# Patient Record
Sex: Female | Born: 1960
Health system: Southern US, Community
[De-identification: ages and names within clinical notes are randomized; demographics above are authoritative.]

## PROBLEM LIST (undated history)

## (undated) DIAGNOSIS — G4730 Sleep apnea, unspecified: Secondary | ICD-10-CM

## (undated) DIAGNOSIS — E0590 Thyrotoxicosis, unspecified without thyrotoxic crisis or storm: Secondary | ICD-10-CM

## (undated) DIAGNOSIS — T7840XA Allergy, unspecified, initial encounter: Secondary | ICD-10-CM

## (undated) DIAGNOSIS — J302 Other seasonal allergic rhinitis: Secondary | ICD-10-CM

## (undated) DIAGNOSIS — E119 Type 2 diabetes mellitus without complications: Secondary | ICD-10-CM

## (undated) DIAGNOSIS — G4733 Obstructive sleep apnea (adult) (pediatric): Secondary | ICD-10-CM

## (undated) DIAGNOSIS — K635 Polyp of colon: Secondary | ICD-10-CM

## (undated) DIAGNOSIS — M1990 Unspecified osteoarthritis, unspecified site: Secondary | ICD-10-CM

## (undated) HISTORY — DX: Other seasonal allergic rhinitis: J30.2

## (undated) HISTORY — DX: Unspecified osteoarthritis, unspecified site: M19.90

## (undated) HISTORY — DX: Sleep apnea, unspecified: G47.30

## (undated) HISTORY — DX: Obstructive sleep apnea (adult) (pediatric): G47.33

## (undated) HISTORY — DX: Type 2 diabetes mellitus without complications: E11.9

## (undated) HISTORY — DX: Allergy, unspecified, initial encounter: T78.40XA

## (undated) HISTORY — DX: Polyp of colon: K63.5

## (undated) HISTORY — PX: POLYPECTOMY: SHX149

## (undated) HISTORY — DX: Thyrotoxicosis, unspecified without thyrotoxic crisis or storm: E05.90

## (undated) HISTORY — PX: CARPAL TUNNEL RELEASE: SHX101

## (undated) HISTORY — PX: BREAST LUMPECTOMY: SHX2

## (undated) HISTORY — PX: BREAST EXCISIONAL BIOPSY: SUR124

## (undated) HISTORY — PX: TRIGGER FINGER RELEASE: SHX641

---

## 2000-12-13 ENCOUNTER — Encounter: Payer: Self-pay | Admitting: Gastroenterology

## 2001-01-13 ENCOUNTER — Encounter: Payer: Self-pay | Admitting: Gastroenterology

## 2005-08-09 ENCOUNTER — Ambulatory Visit (HOSPITAL_BASED_OUTPATIENT_CLINIC_OR_DEPARTMENT_OTHER): Admission: RE | Admit: 2005-08-09 | Discharge: 2005-08-09 | Payer: Self-pay | Admitting: Orthopedic Surgery

## 2005-08-09 ENCOUNTER — Ambulatory Visit (HOSPITAL_COMMUNITY): Admission: RE | Admit: 2005-08-09 | Discharge: 2005-08-09 | Payer: Self-pay | Admitting: Orthopedic Surgery

## 2006-01-11 ENCOUNTER — Encounter: Admission: RE | Admit: 2006-01-11 | Discharge: 2006-01-11 | Payer: Self-pay | Admitting: Family Medicine

## 2006-12-02 ENCOUNTER — Encounter (INDEPENDENT_AMBULATORY_CARE_PROVIDER_SITE_OTHER): Payer: Self-pay | Admitting: *Deleted

## 2006-12-02 ENCOUNTER — Encounter: Admission: RE | Admit: 2006-12-02 | Discharge: 2006-12-02 | Payer: Self-pay | Admitting: Family Medicine

## 2007-01-17 ENCOUNTER — Encounter: Admission: RE | Admit: 2007-01-17 | Discharge: 2007-01-17 | Payer: Self-pay | Admitting: Family Medicine

## 2008-03-12 ENCOUNTER — Encounter: Admission: RE | Admit: 2008-03-12 | Discharge: 2008-03-12 | Payer: Self-pay | Admitting: Family Medicine

## 2009-03-18 ENCOUNTER — Encounter: Admission: RE | Admit: 2009-03-18 | Discharge: 2009-03-18 | Payer: Self-pay | Admitting: Family Medicine

## 2010-03-31 ENCOUNTER — Encounter: Admission: RE | Admit: 2010-03-31 | Discharge: 2010-03-31 | Payer: Self-pay | Admitting: Family Medicine

## 2010-06-15 ENCOUNTER — Encounter: Payer: Self-pay | Admitting: Internal Medicine

## 2010-06-16 ENCOUNTER — Encounter: Payer: Self-pay | Admitting: Internal Medicine

## 2010-06-16 DIAGNOSIS — G4710 Hypersomnia, unspecified: Secondary | ICD-10-CM | POA: Insufficient documentation

## 2010-06-16 DIAGNOSIS — G4730 Sleep apnea, unspecified: Secondary | ICD-10-CM

## 2010-06-19 ENCOUNTER — Encounter: Payer: Self-pay | Admitting: Internal Medicine

## 2010-06-19 ENCOUNTER — Ambulatory Visit (HOSPITAL_BASED_OUTPATIENT_CLINIC_OR_DEPARTMENT_OTHER): Admission: RE | Admit: 2010-06-19 | Discharge: 2010-06-19 | Payer: Self-pay | Admitting: Internal Medicine

## 2010-06-24 ENCOUNTER — Ambulatory Visit: Payer: Self-pay | Admitting: Internal Medicine

## 2010-07-07 ENCOUNTER — Encounter: Payer: Self-pay | Admitting: Family Medicine

## 2010-07-07 ENCOUNTER — Ambulatory Visit: Payer: Self-pay | Admitting: Internal Medicine

## 2010-07-07 DIAGNOSIS — E119 Type 2 diabetes mellitus without complications: Secondary | ICD-10-CM | POA: Insufficient documentation

## 2010-08-02 ENCOUNTER — Encounter: Payer: Self-pay | Admitting: Internal Medicine

## 2010-08-04 ENCOUNTER — Ambulatory Visit: Payer: Self-pay | Admitting: Internal Medicine

## 2010-08-23 ENCOUNTER — Encounter: Payer: Self-pay | Admitting: Internal Medicine

## 2010-09-07 ENCOUNTER — Encounter (INDEPENDENT_AMBULATORY_CARE_PROVIDER_SITE_OTHER): Payer: Self-pay | Admitting: *Deleted

## 2010-09-22 ENCOUNTER — Encounter: Payer: Self-pay | Admitting: Internal Medicine

## 2010-10-16 ENCOUNTER — Encounter: Payer: Self-pay | Admitting: Internal Medicine

## 2010-11-15 ENCOUNTER — Ambulatory Visit: Payer: Self-pay | Admitting: Gastroenterology

## 2010-11-15 DIAGNOSIS — Z860100 Personal history of colon polyps, unspecified: Secondary | ICD-10-CM | POA: Insufficient documentation

## 2010-11-17 ENCOUNTER — Telehealth: Payer: Self-pay | Admitting: Gastroenterology

## 2010-11-20 ENCOUNTER — Telehealth: Payer: Self-pay | Admitting: Gastroenterology

## 2010-11-30 HISTORY — PX: COLONOSCOPY: SHX174

## 2010-12-22 ENCOUNTER — Ambulatory Visit: Payer: Self-pay | Admitting: Gastroenterology

## 2011-01-08 ENCOUNTER — Ambulatory Visit: Admit: 2011-01-08 | Payer: Self-pay | Admitting: Internal Medicine

## 2011-01-30 NOTE — Letter (Signed)
Summary: [facility] Instructions  [facility]  [address] [place], [place] [address]   Phone: [phone]  Fax: [phone]       KAMESHIA MADRUGA    [birth year]    MRN: [id number]        Procedure Day /Date: Monday January 30th, 2012     Arrival Time: 7:30am     Procedure Time: 8:30am     Location of Procedure:                    _x _  [facility] (4th Floor)                        PREPARATION FOR COLONOSCOPY WITH MOVIPREP   Starting 5 days prior to your procedure 01/24/11 do not eat nuts, seeds, popcorn, corn, beans, peas,  salads, or any raw vegetables.  Do not take any fiber supplements (e.g. Metamucil, Citrucel, and Benefiber).  THE DAY BEFORE YOUR PROCEDURE         DATE: 01/28/11  DAY: Sunday  1.  Drink clear liquids the entire day-NO SOLID FOOD  2.  Do not drink anything colored red or purple.  Avoid juices with pulp.  No orange juice.  3.  Drink at least 64 oz. (8 glasses) of fluid/clear liquids during the day to prevent dehydration and help the prep work efficiently.  CLEAR LIQUIDS INCLUDE: Water Jello Ice Popsicles Tea (sugar ok, no milk/cream) Powdered fruit flavored drinks Coffee (sugar ok, no milk/cream) Gatorade Juice: apple, white grape, white cranberry  Lemonade Clear bullion, consomm, broth Carbonated beverages (any kind) Strained chicken noodle soup Hard Candy                             4.  In the morning, mix first dose of MoviPrep solution:    Empty 1 Pouch A and 1 Pouch B into the disposable container    Add lukewarm drinking water to the top line of the container. Mix to dissolve    Refrigerate (mixed solution should be used within 24 hrs)  5.  Begin drinking the prep at 5:00 p.m. The MoviPrep container is divided by 4 marks.   Every 15 minutes drink the solution down to the next mark (approximately 8 oz) until the full liter is complete.   6.  Follow completed prep with 16 oz of clear liquid of your choice  (Nothing red or purple).  Continue to drink clear liquids until bedtime.  7.  Before going to bed, mix second dose of MoviPrep solution:    Empty 1 Pouch A and 1 Pouch B into the disposable container    Add lukewarm drinking water to the top line of the container. Mix to dissolve    Refrigerate  THE DAY OF YOUR PROCEDURE      DATE: 01/29/11 DAY: Monday  Beginning at 3:30 a.m. (5 hours before procedure):         1. Every 15 minutes, drink the solution down to the next mark (approx 8 oz) until the full liter is complete.  2. Follow completed prep with 16 oz. of clear liquid of your choice.    3. You may drink clear liquids until 6:30am (2 HOURS BEFORE PROCEDURE).   MEDICATION INSTRUCTIONS  Unless otherwise instructed, you should take regular prescription medications with a small sip of water   as early as possible the morning of your  procedure.  Diabetic patients - see separate instructions.       OTHER INSTRUCTIONS  You will need a responsible adult at least 52 years of age to accompany you and drive you home.   This person must remain in the waiting room during your procedure.  Wear loose fitting clothing that is easily removed.  Leave jewelry and other valuables at home.  However, you may wish to bring a book to read or  an iPod/MP3 player to listen to music as you wait for your procedure to start.  Remove all body piercing jewelry and leave at home.  Total time from sign-in until discharge is approximately 2-3 hours.  You should go home directly after your procedure and rest.  You can resume normal activities the  day after your procedure.  The day of your procedure you should not:   Drive   Make legal decisions   Operate machinery   Drink alcohol   Return to work  You will receive specific instructions about eating, activities and medications before you leave.    The above instructions have been reviewed and explained to me by   Marchelle Folks.     I  fully understand and can verbalize these instructions _____________________________ Date _________

## 2011-01-30 NOTE — Progress Notes (Signed)
Summary: r/s appt  Phone Note Call from Patient Call back at [phone]   Caller: Patient Details for Reason: r/s appt Summary of Call: Dr. Lodema Hong wanted you to call her about rescheduling her procedure. Initial call taken by: Schuyler Amor,  November 20, 2010 8:22 AM  Follow-up for Phone Call        Pt wanted to reschedule her procedure too a earlier appt due to insurance. Pt rescheduled for 12/22/10 at 3:00pm and pt verbalized understanding.  Follow-up by: Christie Nottingham CMA Duncan Dull),  November 20, 2010 8:35 AM

## 2011-01-30 NOTE — Miscellaneous (Signed)
Summary: needs appt Dr. 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 need to offer appt with Dr. Gerilyn Pilgrim per referral from [facility]. next available is 08/08/10.Golden Circle RN  July 07, 2010 9:19 AM  she called back & asked that I call her monday & her staff will get her to the phone. will make appt then.Golden Circle RN  July 07, 2010 4:49 PM  LM. her staff says she is in with a pt & will call me back.Golden Circle RN  July 10, 2010 4:50 PM   when she calls, offer her 8/30 (a Tues) wuth Dr. Gerilyn Pilgrim.3 or 4pm..Sally Elijah Birk RN  July 10, 2010 5:00 PM  she will see Dr. Gerilyn Pilgrim at [facility] on 08/29/10.Golden Circle RN  July 11, 2010 8:47 AM

## 2011-01-30 NOTE — Assessment & Plan Note (Signed)
Summary: rov 4 wks ///kp   Visit Type:  Follow-up  CC:  followup on cpap and working well no complaints.  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 She reports a hx of snoring, wittnessed apnea and excessive daytime somnolence. She understands the medical issues. We agreed to proceed with a sleep study, based on history strongly consistent with OSA. She has scheduled an appointment with me to f/u the study. July 07, 2010- OSA She complains of excessive snoring, witnessed apnea and daytime fatigue. Caffeine is some help. She works hard, long hours, and is trying to get back control of her own health.  Bedtime 10-11PM, latency 15-30 minutes, wakes several times before up 4-5AM. She is trying to get more sleep. Pat hx of thyroid treatment, no ENT surgery and no cardiopulmonary disease. NPSG 06/19/10- Mild obstructive apnea, AHI 9.3/hr  August 04, 2010- OSA She is using CPAP, set on autoPAP. She is able to use it every night and she understands what it is doing. Husband reports it prevents her snoring. She is sure she is sleeping better and is not uncomfortable, but admits it is still unfamiliar. We talked about auto vs CPAP, and about travel. Full face mask.   Preventive Screening-Counseling & Management  Alcohol-Tobacco     Smoking Status: never  Current Medications (verified): 1)  Janumet 50-1000 Mg Tabs (Sitagliptin-Metformin Hcl) .... Take 1 By Mouth Two Times A Day 2)  Multivitamins  Tabs (Multiple Vitamin) .... Take 1 By Mouth Once Daily 3)  Aspirin 81 Mg Tbec (Aspirin) .... Take 1 By Mouth Once Daily 4)  Calcium D-1200mg /1000iu .... 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    Vital Signs:  Patient profile:   51 year old female Height:      67 inches Weight:      265.50 pounds BMI:     41.73 O2 Sat:      100 % on Room air Pulse rate:   68 / minute BP sitting:   110 / 60  (right arm) Cuff size:   regular  Vitals Entered By: [name] (August 04, 2010 4:25 PM)  O2 Flow:  Room air CC: followup on cpap, working well no complaints Is Patient Diabetic? 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 I think CPAP will work well and hope it can become a comfortable long-term tool for her. Weight loss may ultimately permit her to get off CPAP. Oral appliances could be evaluated as an option if necessary. Good compliance and control with CPAP at this stage. We will leave her with autoPAP. I  don't think we need to make a pressure range change now.   Medications Added to Medication List This Visit: 1)  Calcium D-1200mg /1000iu  .... Take 1 by mouth once daily 2)  Autopap Advanced   Other Orders: Est. Patient Level II (29562)  Patient Instructions: 1)  Please schedule a follow-up appointment in 4 months. 2)  Please let me know if you have comfort issues with the CPAP so we can change if needed. For now you seem to be coming along very well.

## 2011-01-30 NOTE — Letter (Signed)
Summary: New Patient letter  [facility]  [address] [place], [place] [address]   Phone: [phone]  Fax: [phone]       09/07/2010 MRN: [id number]  [name] [address] [place], [place]  [address]  Dear [name],  Welcome to the Gastroenterology Division at [facility].    You are scheduled to see Dr. Russella Dar on 11-15-10 at 10:00A.M. on the 3rd floor at [facility], [address]  We ask that you try to arrive at our office 15 minutes prior to your appointment time to allow for check-in.  We would like you to complete the enclosed self-administered evaluation form prior to your visit and bring it with you on the day of your appointment.  We will review it with you.  Also, please bring a complete list of all your medications or, if you prefer, bring the medication bottles and we will list them.  Please bring your insurance card so that we may make a copy of it.  If your insurance requires a referral to see a specialist, please bring your referral form from your primary care physician.  Co-payments are due at the time of your visit and may be paid by cash, check or credit card.     Your office visit will consist of a consult with your physician (includes a physical exam), any laboratory testing he/she may order, scheduling of any necessary diagnostic testing (e.g. x-ray, ultrasound, CT-scan), and scheduling of a procedure (e.g. Endoscopy, Colonoscopy) if required.  Please allow enough time on your schedule to allow for any/all of these possibilities.    If you cannot keep your appointment, please call [phone] to cancel or reschedule prior to your appointment date.  This allows [name] the opportunity to schedule an appointment for another patient in need of care.  If you do not cancel or reschedule by 5 p.m. the business day prior to your appointment date, you will be charged a $50.00 late cancellation/no-show fee.    Thank you for choosing  [facility] for your medical needs.  We appreciate the opportunity to care for you.  Please visit [name] at our website  to learn more about our practice.                     Sincerely,                                                             The Gastroenterology Division

## 2011-01-30 NOTE — Letter (Signed)
Summary: Diabetic Instructions  [facility]  [address] [place], [place] [address]   Phone: [phone]  Fax: [phone]    Laura Valencia 1961/02/07 MRN: [id number]   _  x_   ORAL DIABETIC MEDICATION INSTRUCTIONS         (Janumet) The day before your procedure:   Take your diabetic pill as you do normally  The day of your procedure:   Do not take your diabetic pill    We will check your blood sugar levels during the admission process and again in Recovery before discharging you home

## 2011-01-30 NOTE — Assessment & Plan Note (Signed)
Summary: sleep consult-review sleep study/kcw   CC:  Sleep new pt-review sleep study..  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 She reports a hx of snoring, wittnessed apnea and excessive daytime somnolence. She understands the medical issues. We agreed to proceed with a sleep study, based on history strongly consistent with OSA. She has scheduled an appointment with me to f/u the study. July 07, 2010- OSA She complains of excessive snoring, witnessed apnea and daytime fatigue. Caffeine is some help. She works hard, long hours, and is trying to get back control of her own health.  Bedtime 10-11PM, latency 15-30 minutes, wakes several times before up 4-5AM. She is trying to get more sleep. Pat hx of thyroid treatment, no ENT surgery and no cardiopulmonary disease. NPSG 06/19/10- Mild obstructive apnea, AHI 9.3/hr  Preventive Screening-Counseling & Management  Alcohol-Tobacco     Smoking Status: never  Current Medications (verified): 1)  Janumet 50-1000 Mg Tabs (Sitagliptin-Metformin Hcl) .... Take 1 By Mouth Two Times A Day 2)  Multivitamins  Tabs (Multiple Vitamin) .... Take 1 By Mouth Once Daily 3)  Aspirin 81 Mg Tbec (Aspirin) .... Take 1 By Mouth Once Daily 4)  Calcium-Vitamin D 500-125 Mg-Unit Tabs (Calcium-Vitamin D) .... 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  The patient denies shortness of breath with activity, shortness of breath at rest, productive cough, non-productive cough, coughing up blood, chest pain, irregular heartbeats, acid heartburn, indigestion, loss of appetite, weight change, abdominal pain, difficulty swallowing, sore throat, tooth/dental problems, nasal congestion/difficulty breathing through nose, sneezing, itching, ear ache, anxiety, depression, hand/feet swelling, joint stiffness or pain, rash, change in color of mucus, and fever.    Vital Signs:  Patient profile:   50 year old female Height:      67 inches Weight:      262 pounds BMI:     41.18 Cuff size:   large  Vitals Entered By: Reynaldo Minium CMA (July 07, 2010 4:32 PM)  O2 Flow:  Room air CC: Sleep new pt-review sleep study.   Physical Exam  Additional Exam:  General: A/Ox3; pleasant and cooperative, NAD, SKIN: no rash, lesions NODES: no lymphadenopathy HEENT: [place]/AT, EOM- WNL, Conjuctivae- clear, PERRLA, TM-WNL, Nose- clear, Throat- clear and wnl, Mallampati  III-IV NECK: Supple w/ fair ROM, JVD- none, normal carotid impulses w/o bruits Thyroid- normal to palpation CHEST: Clear to P&A HEART: RRR, no m/g/r heard ABDOMEN: Overweight ZOX:WRUE, nl pulses, no edema  NEURO: Grossly intact to observation      Impression & Recommendations:  Problem # 1:  HYPERSOMNIA WITH SLEEP APNEA UNSPECIFIED (ICD-780.53) Mild obstructive apnea confirmed by NPSG and consistent with her history.  We discussed good sleep hygiene. She is aware of the importance of weight control. We discussed treatment options and medical issues of untreated  sleep apnea. She is willing to try CPAP, with hope that it can buy her time and opportunity to work her weight down as a long term solution. Oral appliances and chin straps may be considered. Surgery is not an interesting option at this time.  Medications  Added to Medication List This Visit: 1)  Janumet 50-1000 Mg Tabs (Sitagliptin-metformin hcl) .... Take 1 by mouth two times a day 2)  Multivitamins Tabs (Multiple vitamin) .... Take 1 by mouth once daily 3)  Aspirin 81 Mg Tbec (Aspirin) .... Take 1 by mouth once daily 4)  Calcium-vitamin D 500-125 Mg-unit Tabs (Calcium-vitamin d) .... Take 1 by mouth once daily  Other Orders: No Charge Patient Arrived (NCPA0) (NCPA0) DME Referral (DME)  Patient Instructions: 1)  Please schedule a follow-up appointment in 1 month  2)  See [facility] to set up a trial with CPAP

## 2011-01-30 NOTE — Progress Notes (Signed)
Summary: COL resch  Phone Note Call from Patient Call back at [phone]   Caller: Patient Call For: Dr. Russella Dar Reason for Call: Talk to Nurse Summary of Call: pt is diabetic and wants to reschedule her COL 2011... nothing available except afternoon procedures in 2011... pt said she wants to come in the afternoon... will need Dr. Ardell Isaacs approval Initial call taken by: Vallarie Mare,  November 17, 2010 3:16 PM  Follow-up for Phone Call        you can offer her the spot on 11/28/10 1:30.  She is not on insulin should be fine. Follow-up by: Darcey Nora RN, CGRN,  November 17, 2010 3:23 PM  Additional Follow-up for Phone Call Additional follow up Details #1::        pt already resch'ed for Dec 23rd at 3pm Additional Follow-up by: Vallarie Mare,  November 21, 2010 8:20 AM

## 2011-01-30 NOTE — Assessment & Plan Note (Signed)
Summary: establish G.I//discuss COL--ch.   History of Present Illness Visit Type: Initial Visit Primary GI MD: Elie Goody MD [facility] Primary Provider: Dr Ocie Bob Chief Complaint: Patient here to discuss having a colonoscopy. She denies any current GI symptoms. 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 Karilyn Cota. She states that colonoscopy was incomplete and she required a barium enema. She subsequently underwent a virtual colonoscopy in 2007 that was unremarkable. She is no ongoing gastrointestinal complaints. The records from her initial colonoscopy barium enema and polypectomy are not available at the time of this dictation.   GI Review of Systems      Denies abdominal pain, acid reflux, belching, bloating, chest pain, dysphagia with liquids, dysphagia with solids, heartburn, loss of appetite, nausea, vomiting, vomiting blood, weight loss, and  weight gain.        Denies anal fissure, black tarry stools, change in bowel habit, constipation, diarrhea, diverticulosis, fecal incontinence, heme positive stool, hemorrhoids, irritable bowel syndrome, jaundice, light color stool, liver problems, rectal bleeding, and  rectal pain.   Current Medications (verified): 1)  Janumet 50-1000 Mg Tabs (Sitagliptin-Metformin Hcl) .... Take 1 By Mouth Two Times A Day 2)  Multivitamins  Tabs (Multiple Vitamin) .... Take 1 By Mouth Once Daily 3)  Calcium D-1200mg /1000iu .... Take 1 By Mouth Once Daily 4)  Autopap Advanced 5)  Vitamin D (Ergocalciferol) 50000 Unit Caps (Ergocalciferol) .... Take 1 Tablet By Mouth Once Per Week  Allergies (verified): No Known Drug Allergies  Past History:  Past Medical History: Diabetes, Type 2 Hx hyperthyroid Rx in past Seasonal rhinitis- mild Colon polyp-type unknown  Obstructive Sleep Apnea- NPSG 06/19/10- AHI 9.3/hr  Past Surgical History: Carpal tunnel  Release-bilateral Left breast lumpectomy- benign  Family History: Cancer-mother(colon cancer at age 24)  Social History: Reviewed history from 07/07/2010 and no changes required. 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         The pertinent positives and negatives are noted as above and in the HPI. All other ROS were reviewed and were negative.   Vital Signs:  Patient profile:   50 year old female Height:      67 inches Weight:      254 pounds BMI:     39.93 BSA:     2.24 Pulse rate:   64 / minute BP sitting:   112 / 80  (left arm) Cuff size:   large  Vitals Entered By: Lamona Curl CMA Duncan Dull) (November 15, 2010 10:26 AM)  Physical Exam  General:  Well developed, well nourished, no acute distress. obese.   Head:  Normocephalic and atraumatic. Eyes:  PERRLA, no icterus. Ears:  Normal auditory acuity. Mouth:  No deformity or lesions, dentition normal. Neck:  Supple; no masses or thyromegaly. Lungs:  Clear throughout to auscultation. Heart:  Regular rate and rhythm; no murmurs, rubs,  or bruits. Abdomen:  Soft, nontender and nondistended. No masses, hepatosplenomegaly or hernias noted. Normal bowel sounds. Rectal:  deferred until time of colonoscopy.   Msk:  Symmetrical with no gross deformities. Normal posture. Pulses:  Normal pulses noted. Extremities:  No clubbing, cyanosis, edema or deformities noted. Neurologic:  Alert and  oriented x4;  grossly normal neurologically. Cervical Nodes:  No significant cervical adenopathy.  Inguinal Nodes:  No significant inguinal adenopathy. Psych:  Alert and cooperative. Normal mood and affect.  Impression & Recommendations:  Problem # 1:  PERSONAL HISTORY OF COLONIC POLYPS (ICD-V12.72) Prior history of colon polyps. The patient feels the polyps were precancerous. Request records  from her prior colonoscopy and pathology. Even if the polyps are not precancerous a screening colonoscopy is indicated given approximately 10 years since her last colonoscopy in 5 years since her virtual colonoscopy. We discussed the options of virtual colonoscopy versus standard colonoscopy and she prefers an attempt at colonoscopy but understands that this exam might be incomplete given prior difficulties. A subsequent virtual colonoscopy or barium enema if colonoscopy is not complete. The risks, benefits and alternatives to colonoscopy with possible biopsy and possible polypectomy were discussed with the patient and they consent to proceed. The procedure will be scheduled electively. Orders: Colonoscopy (Colon)  Problem # 2:  DIABETES, TYPE 2 (ICD-250.00) Management per protocol for diabetes, and oral hypoglycemics.  Patient Instructions: 1)  Pick up your prep from your pharmacy.  2)  Colonoscopy brochure given.  3)  Copy sent to : Renaye Rakers, MD 4)  The medication list was reviewed and reconciled.  All changed / newly prescribed medications were explained.  A complete medication list was provided to the patient / caregiver.  Prescriptions: MOVIPREP 100 GM  SOLR (PEG-KCL-NACL-NASULF-NA ASC-C) As per prep instructions.  #1 x 0   Entered by:   Christie Nottingham CMA (AAMA)   Authorized by:   Meryl Dare MD [facility]   Signed by:   Christie Nottingham CMA Duncan Dull) on 11/15/2010   Method used:   Electronically to        [facility]* (retail)       [address]       [address] [employer]       [place], [place]  [address]       Ph: [phone]       Fax: [phone]   RxID:   [id number]

## 2011-01-30 NOTE — Assessment & Plan Note (Signed)
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 She reports a hx of snoring, wittnessed apnea and excessive daytime somnolence. She understands the medical issues. We agreed to proceed with a sleep study, based on history strongly consistent with OSA. She has scheduled an appointment with me to f/u the study.   Impression & Recommendations:  Problem # 1:  ? of HYPERSOMNIA WITH SLEEP APNEA UNSPECIFIED (ICD-780.53)  We will proceed with Split protocol sleep study as discussed, then review at office.  Other Orders: Sleep Disorder Referral (Sleep Disorder)  Patient Instructions: 1)  Make appointment for sleep study through [facility]  Appended Document: ?sleep apnea  No charge for phone discussion. Not yet seen in office. Office note created for work space in EMR only, for this date.

## 2011-01-30 NOTE — Procedures (Signed)
Summary: Colonoscopy / [facility]  Colonoscopy / [facility]   Imported By: Lennie Odor 12/08/2010 15:41:02  _____________________________________________________________________  External Attachment:    Type:   Image     Comment:   External Document

## 2011-02-01 NOTE — Procedures (Signed)
Summary: Colonoscopy  Patient: Laura Valencia Note: All result statuses are Final unless otherwise noted.  Tests: (1) Colonoscopy (COL)   COL Colonoscopy           DONE     [facility]     [address]     [place], [place]  [address]           COLONOSCOPY PROCEDURE REPORT     PATIENT:  Laura Valencia, Laura Valencia  MR#:  [id number]     BIRTHDATE:  1961/06/13, 49 yrs. old  GENDER:  female     ENDOSCOPIST:  Judie Petit T. Russella Dar, MD, [facility]           PROCEDURE DATE:  12/22/2010     PROCEDURE:  Colonoscopy 19147     ASA CLASS:  Class II     INDICATIONS:  1) surveillance and high-risk screening  2) mother     with colon cancer at 64 3) history of adenomatous polyps: 2001.     MEDICATIONS:   Fentanyl 50 mcg IV, Versed 7.5 mg IV, Benadryl 12.5     mg IV     DESCRIPTION OF PROCEDURE:   After the risks benefits and     alternatives of the procedure were thoroughly explained, informed     consent was obtained.  Digital rectal exam was performed and     revealed no abnormalities.   The LB [id number]-[id number] endoscope     was introduced through the anus and advanced to the cecum, which     was identified by both the appendix and ileocecal valve, limited     by a tortuous colon.    The quality of the prep was excellent,     using MoviPrep.  The instrument was then slowly withdrawn as the     colon was fully examined.     <<PROCEDUREIMAGES>>     FINDINGS:  A normal appearing cecum, ileocecal valve, and     appendiceal orifice were identified. The ascending, hepatic     flexure, transverse, splenic flexure, descending, sigmoid colon,     and rectum appeared unremarkable. Retroflexed views in the rectum     revealed no abnormalities.  The time to cecum =  5.75  minutes.     The scope was then withdrawn (time =  12.75  min) from the patient     and the procedure completed.           COMPLICATIONS:  None           ENDOSCOPIC IMPRESSION:     1) Normal colon           RECOMMENDATIONS:     1)  Repeat Colonoscopy in 5 years.           Venita Lick. Russella Dar, MD, [facility]           CC: Renaye Rakers, MD           n.     Rosalie DoctorVenita Lick. Stark at 12/22/2010 03:24 PM           Syliva Overman, [id number]  Note: An exclamation mark (!) indicates a result that was not dispersed into the flowsheet. Document Creation Date: 12/22/2010 3:24 PM _______________________________________________________________________  (1) Order result status: Final Collection or observation date-time: 12/22/2010 15:19 Requested date-time:  Receipt date-time:  Reported date-time:  Referring Physician:   Ordering Physician: Claudette Head [phone]) Specimen Source:  Source: Launa Grill Order Number: [id number] Lab site:   Appended Document:  Colonoscopy    Clinical Lists Changes  Observations: Added new observation of COLONNXTDUE: 12/23/2015 (12/22/2010 15:32)

## 2011-02-09 ENCOUNTER — Ambulatory Visit: Payer: Self-pay | Admitting: Internal Medicine

## 2011-03-01 ENCOUNTER — Other Ambulatory Visit: Payer: Self-pay | Admitting: Family Medicine

## 2011-03-01 DIAGNOSIS — Z1231 Encounter for screening mammogram for malignant neoplasm of breast: Secondary | ICD-10-CM

## 2011-03-12 LAB — GLUCOSE, CAPILLARY
Glucose-Capillary: 137 mg/dL — ABNORMAL HIGH (ref 70–99)
Glucose-Capillary: 83 mg/dL (ref 70–99)

## 2011-03-23 ENCOUNTER — Ambulatory Visit: Payer: Self-pay | Admitting: Internal Medicine

## 2011-04-06 ENCOUNTER — Ambulatory Visit
Admission: RE | Admit: 2011-04-06 | Discharge: 2011-04-06 | Disposition: A | Discharge status: Home / Self Care | Payer: Commercial Managed Care - PPO | Source: Ambulatory Visit | Attending: Family Medicine | Admitting: Family Medicine

## 2011-04-06 ENCOUNTER — Ambulatory Visit: Payer: Self-pay

## 2011-04-06 DIAGNOSIS — Z1231 Encounter for screening mammogram for malignant neoplasm of breast: Secondary | ICD-10-CM

## 2011-05-18 NOTE — Op Note (Signed)
NAMESHATERRIA, SAGER            ACCOUNT NO.:  [id number]   MEDICAL RECORD NO.:  [id number]          PATIENT TYPE:  AMB   LOCATION:  DSC                          FACILITY:  [facility]   PHYSICIAN:  Dionne Ano. Gramig III, M.D.DATE OF BIRTH:  [birth year]   DATE OF PROCEDURE:  08/09/2005  DATE OF DISCHARGE:                                 OPERATIVE REPORT   PREOPERATIVE DIAGNOSIS:  Left carpal tunnel syndrome.   POSTOPERATIVE DIAGNOSIS:  Left carpal tunnel syndrome.   OPERATION PERFORMED:  1.  Left median nerve/peripheral nerve block.  2.  Left limited open carpal tunnel release.   SURGEON:  Dionne Ano. Amanda Pea, M.D.   ASSISTANT:  None.   ANESTHESIA:  Peripheral nerve block with IV sedation keeping the patient  awake, alert and oriented the entire case.   COMPLICATIONS:  None.   ESTIMATED BLOOD LOSS:  Minimal.   INDICATIONS FOR PROCEDURE:  Dr. Syliva Overman is a very pleasant 50-year-  old female.  She presents for carpal tunnel release.  I have discussed the  risks and benefits of surgery and she desires to proceed with the operative  intervention.  She has had a similar procedure performed about the right  upper extremity and understands the risks and benefits, do's and don't's,  pre and postoperative routines.   DESCRIPTION OF PROCEDURE:  The patient was seen by myself and anesthesia and  taken to the operating suite, permit was signed.  Correct extremity to be  operated on was identified.  She underwent a median nerve/peripheral nerve  block at the wrist __________ purposes for carpal tunnel release with 18 mL  of a mixture of lidocaine 1% without epinephrine and 0.25% Sensorcaine  without epinephrine.  A small amount of Neutrophil was added.  Once this was  done, the patient was prepped and draped in the usual sterile fashion.  She  was kept awake, alert and oriented and a 1 cm incision was made at the  distal edge of the transverse carpal ligament under 250 mmHg of  tourniquet  control.  Dissection was carried down this way until the transverse carpal  ligament was identified and released under 4-point Sheeley loupe  magnification, fat pad egressed nicely.  I took care to release her ulnarly  and then dissected in a distal to proximal direction __________ device 1, 2  and 3 which were placed under the proximal leading leaflet of the transverse  carpal ligament. I  then placed a security clip just under the proximal  leading leaflet.  Obturator disengaged and I placed a security knife in the  security clip effectively releasing the proximal leaflet of the transverse  carpal ligament.  I should note that she had no aberrant median nerve  anatomy, no pain or discomfort during the procedure and all went quite well.  She was fully decompressed, I noted that she was nicely released and that  there was no complicating features.  She had impressive wall thickness of  the transverse carpal ligament distally where the compression was  noticeable.  The patient had the tourniquet deflated at less than 15  minutes.  Irrigation was applied.  Hemostasis obtained with bipolar  electrocautery and once hemostasis was secured and irrigation was  placed, she underwent closure of the wound with interrupted Prolene.  She  was placed in sterile dressing.  Had excellent refill and no complicating  features.  She was transferred to recovery room.  She will return to see [name]  in seven days for follow-up.  All questions have been encouraged and  answered.        WMG/MEDQ  D:  08/09/2005  T:  08/10/2005  Job:  [id number]

## 2011-05-21 ENCOUNTER — Encounter: Payer: Self-pay | Admitting: Internal Medicine

## 2011-05-25 ENCOUNTER — Ambulatory Visit: Payer: Self-pay | Admitting: Internal Medicine

## 2011-08-03 ENCOUNTER — Encounter: Payer: Self-pay | Admitting: Internal Medicine

## 2011-08-03 ENCOUNTER — Ambulatory Visit (INDEPENDENT_AMBULATORY_CARE_PROVIDER_SITE_OTHER): Payer: Commercial Managed Care - PPO | Admitting: Internal Medicine

## 2011-08-03 VITALS — BP 102/74 | HR 64 | Ht 66.0 in | Wt 256.4 lb

## 2011-08-03 DIAGNOSIS — G4730 Sleep apnea, unspecified: Secondary | ICD-10-CM

## 2011-08-03 DIAGNOSIS — G4710 Hypersomnia, unspecified: Secondary | ICD-10-CM

## 2011-08-03 NOTE — Progress Notes (Signed)
  Subjective:    Patient ID: Laura Valencia, female    DOB: 04/04/1961, 50 y.o.   MRN: [id number]  HPI 08/03/11-  Last here-  Life is much better now with AutoPaP. Range is ok. Has settled on full face mask. She is convinced she needs it now, and recognizes headaches if she doesn't. She wears CPAP all night every night and is comfortable with the mask and pressure. Nasal congestion has not been a problem.  Review of Systems Constitutional:   No-   weight loss, night sweats, fevers, chills, fatigue, lassitude. HEENT:   No-   headaches, difficulty swallowing, tooth/dental problems, sore throat,                  No-   sneezing, itching, ear ache, nasal congestion, post nasal drip,  CV:  No-   chest pain, orthopnea, PND, swelling in lower extremities, anasarca, dizziness, palpitations GI:  No-   heartburn, indigestion, abdominal pain, nausea, vomiting, diarrhea,                 change in bowel habits, loss of appetite Resp: No-   shortness of breath with exertion or at rest.  No-  excess mucus,             No-   productive cough,  No non-productive cough,  No-  coughing up of blood.              No-   change in color of mucus.  No- wheezing.   Skin: No-   rash or lesions. GU: No-   dysuria, change in color of urine, no urgency or frequency.  No- flank pain. MS:  No-   joint pain or swelling.  No- decreased range of motion.  No- back pain. Psych:  No- change in mood or affect. No depression or anxiety.  No memory loss.     Objective:   Physical Exam General- Alert, Oriented, Affect-appropriate, Distress- none acute   Skin- rash-none, lesions- none, excoriation- none Lymphadenopathy- none Head- atraumatic            Eyes- Gross vision intact, PERRLA, conjunctivae clear secretions            Ears- Hearing, normal            Nose- Clear,   No evident-Septal dev, mucus, polyps, erosion, perforation             Throat- Mallampati II- noted prior exam , mucosa clear , drainage- none, tonsils-  atrophic Neck- flexible , trachea midline, no stridor , thyroid nl, carotid no bruit Chest - symmetrical excursion , unlabored           Heart/CV- RRR , no murmur , no gallop  , no rub, nl s1 s2                           - JVD- none , edema- none, stasis changes- none, varices- none           Lung- clear to P&A, wheeze- none, cough- none , dullness-none, rub- none           Chest wall-  Abd-  Noted prior exam -tender-no, distended-no, bowel sounds-present, HSM- no Br/ Gen/ Rectal- Not done, not indicated Extrem- cyanosis- none, clubbing, none, atrophy- none, strength- nl Neuro- grossly intact to observation         Assessment & Plan:

## 2011-08-03 NOTE — Patient Instructions (Signed)
Continue with CPAP using AutoPap. Consider visiting [facility] so that they can show you mask alternatives.

## 2011-08-03 NOTE — Assessment & Plan Note (Addendum)
Good compliance and control with [facility]ap/ [facility]. No changes needed. Alternatives were reviewed. Weight loss would help. We discussed alternatives to CPAP so that she would understand options.

## 2011-12-18 ENCOUNTER — Encounter (INDEPENDENT_AMBULATORY_CARE_PROVIDER_SITE_OTHER): Payer: Self-pay | Admitting: *Deleted

## 2011-12-20 ENCOUNTER — Encounter: Payer: Self-pay | Admitting: Internal Medicine

## 2012-04-03 ENCOUNTER — Other Ambulatory Visit: Payer: Self-pay | Admitting: Family Medicine

## 2012-04-03 DIAGNOSIS — Z1231 Encounter for screening mammogram for malignant neoplasm of breast: Secondary | ICD-10-CM

## 2012-04-11 ENCOUNTER — Ambulatory Visit: Payer: Commercial Managed Care - PPO

## 2012-05-02 ENCOUNTER — Ambulatory Visit: Payer: Commercial Managed Care - PPO

## 2012-05-30 ENCOUNTER — Ambulatory Visit
Admission: RE | Admit: 2012-05-30 | Discharge: 2012-05-30 | Disposition: A | Discharge status: Home / Self Care | Payer: 59 | Source: Ambulatory Visit | Attending: Family Medicine | Admitting: Family Medicine

## 2012-05-30 DIAGNOSIS — Z1231 Encounter for screening mammogram for malignant neoplasm of breast: Secondary | ICD-10-CM

## 2013-04-20 ENCOUNTER — Other Ambulatory Visit: Payer: Self-pay

## 2013-04-20 DIAGNOSIS — Z1231 Encounter for screening mammogram for malignant neoplasm of breast: Secondary | ICD-10-CM

## 2013-06-12 ENCOUNTER — Ambulatory Visit
Admission: RE | Admit: 2013-06-12 | Discharge: 2013-06-12 | Disposition: A | Discharge status: Home / Self Care | Payer: 59 | Source: Ambulatory Visit

## 2013-06-12 DIAGNOSIS — Z1231 Encounter for screening mammogram for malignant neoplasm of breast: Secondary | ICD-10-CM

## 2013-10-01 ENCOUNTER — Other Ambulatory Visit: Payer: Self-pay | Admitting: Family Medicine

## 2013-10-02 LAB — CBC WITH DIFFERENTIAL/PLATELET
Basophils Relative: 0 % (ref 0–1)
Eosinophils Relative: 0 % (ref 0–5)
HCT: 42 % (ref 36.0–46.0)
Lymphs Abs: 2.6 10*3/uL (ref 0.7–4.0)
MCH: 26.7 pg (ref 26.0–34.0)
MCV: 81.9 fL (ref 78.0–100.0)
Monocytes Absolute: 0.6 10*3/uL (ref 0.1–1.0)
Neutro Abs: 2.1 10*3/uL (ref 1.7–7.7)
RBC: 5.13 MIL/uL — ABNORMAL HIGH (ref 3.87–5.11)
RDW: 14 % (ref 11.5–15.5)
WBC: 5.4 10*3/uL (ref 4.0–10.5)

## 2013-10-02 LAB — LIPID PANEL
HDL: 68 mg/dL (ref >39)
LDL Cholesterol: 95 mg/dL (ref 0–99)
Total CHOL/HDL Ratio: 2.6 Ratio
Triglycerides: 77 mg/dL (ref <150)
VLDL: 15 mg/dL (ref 0–40)

## 2013-10-02 LAB — COMPLETE METABOLIC PANEL WITH GFR
CO2: 25 mEq/L (ref 19–32)
Chloride: 102 mEq/L (ref 96–112)
GFR, Est African American: >89 mL/min
GFR, Est Non African American: 81 mL/min
Sodium: 139 mEq/L (ref 135–145)
Total Protein: 7.3 g/dL (ref 6.0–8.3)

## 2014-05-07 ENCOUNTER — Other Ambulatory Visit: Payer: Self-pay

## 2014-05-07 DIAGNOSIS — Z1231 Encounter for screening mammogram for malignant neoplasm of breast: Secondary | ICD-10-CM

## 2014-06-18 ENCOUNTER — Ambulatory Visit
Admission: RE | Admit: 2014-06-18 | Discharge: 2014-06-18 | Disposition: A | Discharge status: Home / Self Care | Payer: 59 | Source: Ambulatory Visit

## 2014-06-18 DIAGNOSIS — Z1231 Encounter for screening mammogram for malignant neoplasm of breast: Secondary | ICD-10-CM

## 2015-01-14 ENCOUNTER — Encounter: Payer: 59 | Admitting: Nutrition

## 2015-07-12 ENCOUNTER — Other Ambulatory Visit: Payer: Self-pay

## 2015-07-12 DIAGNOSIS — Z1231 Encounter for screening mammogram for malignant neoplasm of breast: Secondary | ICD-10-CM

## 2015-08-19 ENCOUNTER — Ambulatory Visit: Payer: 59

## 2015-08-26 ENCOUNTER — Ambulatory Visit: Payer: 59

## 2015-08-26 ENCOUNTER — Ambulatory Visit
Admission: RE | Admit: 2015-08-26 | Discharge: 2015-08-26 | Disposition: A | Discharge status: Home / Self Care | Payer: 59 | Source: Ambulatory Visit

## 2015-08-26 DIAGNOSIS — Z1231 Encounter for screening mammogram for malignant neoplasm of breast: Secondary | ICD-10-CM

## 2015-09-23 ENCOUNTER — Ambulatory Visit: Payer: 59

## 2015-11-30 ENCOUNTER — Other Ambulatory Visit: Payer: Self-pay | Admitting: *Deleted

## 2015-11-30 ENCOUNTER — Ambulatory Visit: Payer: 59 | Admitting: *Deleted

## 2015-11-30 ENCOUNTER — Encounter: Payer: Self-pay | Admitting: *Deleted

## 2015-11-30 NOTE — Patient Outreach (Addendum)
Laura Valencia [facility]) Care Management   11/30/2015  RAVONDA WALDMANN 11/20/1961 Laura Valencia:[id number]  Laura Valencia is an 54 y.o. female who presents, with her husband Laura Valencia,  to the [employer] [facility] office for routine Link To Wellness follow up for self management assistance with Type II DM.   Subjective:  [name] says she is motivated to lose weight after her adult son and daughter voiced concern about her health during their Thanksgiving visit.  Objective:   Review of Systems  Constitutional: Negative.     Physical Exam  Constitutional: She is oriented to person, place, and time. She appears well-developed and well-nourished.  Respiratory: Effort normal.  Neurological: She is alert and oriented to person, place, and time.  Skin: Skin is warm and dry.  Psychiatric: She has a normal mood and affect. Her behavior is normal. Judgment and thought content normal.   Filed Vitals:   11/30/15 1245  BP: 92/64    Current Medications:   Current Outpatient Prescriptions  Medication Sig Dispense Refill  . sitaGLIPtan-metformin (JANUMET) 50-1000 MG per tablet Take 1 tablet by mouth 2 (two) times daily.      . Calcium Carbonate-Vit D-Min (CALCIUM 1200 PO) Take 1 tablet by mouth daily.      . Multiple Vitamin (MULTIVITAMIN) tablet Take 1 tablet by mouth daily.       No current facility-administered medications for this visit.    Functional Status:   In your present state of health, do you have any difficulty performing the following activities: 11/30/2015  Hearing? N  Vision? N  Difficulty concentrating or making decisions? N  Walking or climbing stairs? Y  Dressing or bathing? N  Doing errands, shopping? N    Fall/Depression Screening:    PHQ 2/9 Scores 11/30/2015  PHQ - 2 Score 0    Assessment:   [employer] employee and Link To Wellness member with Type II DM currently meeting Hgb A1C target. Verbalizing desire to lose weight in  the next 6 months.  Plan:  [facility] CM Care Plan Problem One        Most Recent Value   Care Plan Problem One  Patent with Type II DM and obesity, expressing desire to lose weight and lower Hgb A1C   Role Documenting the Problem One  Care Management [place] for Problem One  Active   THN Long Term Goal (31-90 days)  Patient will report increased exercise frequency, no weight gain and improved glycemic control as evidenced by improved Hgb A1C at next assessment   THN Long Term Goal Start Date  11/30/15   Interventions for Problem One Long Term Goal  reviewed strategies to improve glycemic control, reviewed exercise opportunities offered by [facility], reviewed nutritional counseling benefit offered by [facility], will arrange for Link To Wellness follow up in 6 months      RNCM to fax today's office visit note to [name]. Also faxed request for most recent office visit note and lab results. RNCM will meet every 6 months and as needed with patient per Link To Wellness program guidelines to assist with Type II DM self-management and assess patient's progress toward mutually set goals.  Barrington Ellison RN,CCM,CDE [place] Management Coordinator Link To Wellness Office Phone [phone] Office Fax [phone]

## 2015-12-23 ENCOUNTER — Encounter: Payer: Self-pay | Admitting: Gastroenterology

## 2016-01-04 MED FILL — INVOKANA 300 MG TABLET: 300 | 90 days supply | Qty: 90 | Fill #1

## 2016-01-05 MED FILL — JANUMET XR 50-1,000 MG TAB: 50-1000 | 90 days supply | Qty: 180 | Fill #1

## 2016-01-12 MED FILL — TRUEplus LANCETS 30G MISC: 90 days supply | Qty: 200 | Fill #0

## 2016-01-12 MED FILL — SM ALCOHOL 70% PREP PADS: 70 | 30 days supply | Qty: 100 | Fill #0

## 2016-01-12 MED FILL — TRUE METRIX GLUCOSE TEST ST: 90 days supply | Qty: 200 | Fill #0

## 2016-01-20 ENCOUNTER — Encounter: Payer: 59 | Attending: Family Medicine | Admitting: Nutrition

## 2016-01-20 VITALS — Wt 248.0 lb

## 2016-01-20 DIAGNOSIS — E669 Obesity, unspecified: Secondary | ICD-10-CM

## 2016-01-20 DIAGNOSIS — E119 Type 2 diabetes mellitus without complications: Secondary | ICD-10-CM | POA: Insufficient documentation

## 2016-01-23 ENCOUNTER — Encounter: Payer: Self-pay | Admitting: Nutrition

## 2016-01-23 NOTE — Patient Instructions (Addendum)
   Goals: 1. Follow Plate Method 2. Cut out snacks between meals. 3. Eat meals on time. Nothing after 7 pm unless having a low blood sugar. 4. Portion foods outs. 5. Exercise 30+ minutes 5 days per week. 6. Lose 1-2 lbs per week. 7. Increase low carb vegetables to 4-5 servings per day. 8. Get A1C to 5.7-6.0%.

## 2016-01-23 NOTE — Progress Notes (Signed)
  Medical Nutrition Therapy:  Appt start time: 1500 end time:  1600.  Assessment:  Primary concerns today: Diabetes and obesity. Lives with her husband. Type 2 DM.  Last A1C was 6.1% per pt. On Janumet and Invokana daily. Wants to lose weight and improve blood sugars. She and her husband shop and cook. Most meals eaten at home, baked and broiled. Eats a good variety of foods. Admits to needing to exercise more and cut out snacks. Currenlty taking  Janumet and Invokana for DM. Diet is excessive in carbs at times. Need to cut out excess calories and exercise more for needed weight loss.  Preferred Learning Style:  No preference indicated   Learning Readiness:   Ready  Change in progress   MEDICATIONS: See list   DIETARY INTAKE:   24-hr recall:  Eats three meals per day. May end up eating later at night at times.  Snacks on misc stuff between meals at times. Drinks mostly water. Beverages: water  Usual physical activity: ADL and some walking  Estimated energy needs: 1500 calories 170 g carbohydrates 112 g protein 42 g fat  Progress Towards Goal(s):  In progress.   Nutritional Diagnosis:  NB-1.1 Food and nutrition-related knowledge deficit As related to Diabetes.  As evidenced by A1C 6.1%..    Intervention:  Nutrition and Diabetes education provided on My Plate, CHO counting, meal planning, portion sizes, timing of meals, avoiding snacks between meals unless having a low blood sugar, target ranges for A1C and blood sugars, signs/symptoms and treatment of hyper/hypoglycemia, monitoring blood sugars, taking medications as prescribed, benefits of exercising 30 minutes per day and prevention of complications of DM.  Goals: 1. Follow Plate Method 2. Cut out snacks between meals. 3. Eat meals on time. Nothing after 7 pm unless having a low blood sugar. 4. Portion foods outs. 5. Exercise 30+ minutes 5 days per week. 6. Lose 1-2 lbs per week. 7. Increase low carb vegetables to  4-5 servings per day. 8. Get A1C to 5.7-6.0%.   Teaching Method Utilized:  Visual Auditory Hands on  Handouts given during visit include:  The Plate Method  Meal Plan Card  Diabetes Instructions.   Barriers to learning/adherence to lifestyle change: None  Demonstrated degree of understanding via:  Teach Back   Monitoring/Evaluation:  Dietary intake, exercise, meal planning, SBG, and body weight in 1 month(s).

## 2016-03-02 MED FILL — RESTASIS 0.05% EYE EMULSION: 0.05 | 90 days supply | Qty: 180 | Fill #2

## 2016-04-06 ENCOUNTER — Ambulatory Visit: Payer: Self-pay | Admitting: Family Medicine

## 2016-04-13 ENCOUNTER — Ambulatory Visit: Payer: Self-pay | Admitting: Family Medicine

## 2016-04-13 ENCOUNTER — Ambulatory Visit (INDEPENDENT_AMBULATORY_CARE_PROVIDER_SITE_OTHER): Payer: 59 | Admitting: Family Medicine

## 2016-04-13 ENCOUNTER — Encounter: Payer: Self-pay | Admitting: Family Medicine

## 2016-04-13 VITALS — BP 134/84 | Ht 66.5 in | Wt 237.0 lb

## 2016-04-13 DIAGNOSIS — G4710 Hypersomnia, unspecified: Secondary | ICD-10-CM

## 2016-04-13 DIAGNOSIS — E785 Hyperlipidemia, unspecified: Secondary | ICD-10-CM

## 2016-04-13 DIAGNOSIS — G4730 Sleep apnea, unspecified: Secondary | ICD-10-CM

## 2016-04-13 DIAGNOSIS — E119 Type 2 diabetes mellitus without complications: Secondary | ICD-10-CM | POA: Diagnosis not present

## 2016-04-13 LAB — POCT GLYCOSYLATED HEMOGLOBIN (HGB A1C): Hemoglobin A1C: 6.1

## 2016-04-13 MED ORDER — JANUMET XR 50-1000 MG PO TB24: ORAL_TABLET | ORAL | Status: DC

## 2016-04-13 MED ORDER — CANAGLIFLOZIN 300 MG PO TABS: 300.0000 mg | ORAL_TABLET | Freq: Every day | ORAL | Status: DC

## 2016-04-13 MED ORDER — FLUTICASONE PROPIONATE 50 MCG/ACT NA SUSP: 1.0000 | Freq: Every day | NASAL | Status: DC

## 2016-04-13 MED ORDER — SITAGLIPTIN PHOS-METFORMIN HCL 50-1000 MG PO TABS: 1.0000 | ORAL_TABLET | Freq: Two times a day (BID) | ORAL | Status: DC

## 2016-04-13 MED FILL — JANUMET XR 50-1,000 MG TAB: 50-1000 | 90 days supply | Qty: 180 | Fill #0

## 2016-04-13 MED FILL — INVOKANA 300 MG TABLET: 300 | 90 days supply | Qty: 90 | Fill #0

## 2016-04-13 MED FILL — FLUTICASONE PROP 50 MCG SPR: 50 | 90 days supply | Qty: 48 | Fill #1

## 2016-04-13 NOTE — Progress Notes (Signed)
   Subjective:    Patient ID: SUZANNA HALLEY, female    DOB: 1961-02-27, 55 y.o.   MRN: [id number] Pt arrives today as a new pt.  Diabetes She presents for her follow-up diabetic visit. She has type 2 diabetes mellitus.   Results for orders placed or performed in visit on 04/13/16  POCT glycosylated hemoglobin (Hb A1C)  Result Value Ref Range   Hemoglobin A1C 6.1    Pt needs refills on all meds.   Has had diabetes for 9 years.  Two kids, both working for same company  7.8 or eight   On invokana four yrs, overall handling it well  janumet , for the past five  Cholesterol is up at times. Particularly the LDL. Generally in the low 100s. Patient feels and nose this is not her exact. She has been trying hard is ready to start statins after we get this blood work results occas 110  Working hard on things, has lost ten pounds,  nutriotionifive day sper wk, of ice visit   Good exercis r results    mammo yrly colon stark utd five yr routine, tub adenoma,  Patient notes her preventive women's care via dr Sandria Bales,  Normally BP good,  Sleep apnea tends to not always use sleep apnea device, do to get back to see the specialist soon   On asa low dose because of family history of strok   Pt states no concerns today.    Review of Systems No headache, no major weight loss or weight gain, no chest pain no back pain abdominal pain no change in bowel habits complete ROS otherwise negative     Objective:   Physical Exam  Alert vitals stable BMI 37 blood pressure much improved on repeat HEENT normal. Lungs clear. Heart regular rhythm. Ankles mild edema veins clear bilateral varices C diabetic foot exam      Assessment & Plan:  Impression 1 type 2 diabetes good control. Discussed #2 hyperlipidemia suboptimal in control discussed patient ready for statins await blood work #3 morbid obesity weight loss discussed #4 venous stasis discussed #5 sleep apnea discussed plan encouraged  to wear compression stockings during the day. Exercise diet discussed medications refilled. Recheck in 6 months. May well need to add medication for lipids await results WSL

## 2016-04-14 ENCOUNTER — Other Ambulatory Visit: Payer: Self-pay | Admitting: Family Medicine

## 2016-04-14 DIAGNOSIS — E782 Mixed hyperlipidemia: Secondary | ICD-10-CM | POA: Insufficient documentation

## 2016-04-14 DIAGNOSIS — E119 Type 2 diabetes mellitus without complications: Secondary | ICD-10-CM | POA: Diagnosis not present

## 2016-04-15 LAB — MICROALBUMIN, URINE: MICROALB UR: 0.7 mg/dL

## 2016-04-15 LAB — HEMOGLOBIN A1C
Hgb A1c MFr Bld: 6.4 % — ABNORMAL HIGH (ref <5.7)
MEAN PLASMA GLUCOSE: 137 mg/dL

## 2016-05-07 ENCOUNTER — Encounter: Payer: Self-pay | Admitting: Family Medicine

## 2016-05-07 ENCOUNTER — Telehealth: Payer: Self-pay

## 2016-05-07 DIAGNOSIS — G4733 Obstructive sleep apnea (adult) (pediatric): Secondary | ICD-10-CM | POA: Diagnosis not present

## 2016-05-07 NOTE — Telephone Encounter (Signed)
[facility]- results to patient's recent lab work is in the form folder at nurse's station.

## 2016-05-08 NOTE — Telephone Encounter (Signed)
Patient notified via mychart per patient request.

## 2016-05-18 ENCOUNTER — Other Ambulatory Visit: Payer: Self-pay | Admitting: *Deleted

## 2016-05-18 ENCOUNTER — Encounter: Payer: Self-pay | Admitting: Family Medicine

## 2016-05-18 DIAGNOSIS — E119 Type 2 diabetes mellitus without complications: Secondary | ICD-10-CM

## 2016-06-11 MED FILL — RESTASIS 0.05% EYE EMULSION: 0.05 | 90 days supply | Qty: 180 | Fill #3

## 2016-06-12 ENCOUNTER — Encounter: Payer: Self-pay | Admitting: Gastroenterology

## 2016-06-26 MED FILL — CEPHALEXIN 500 MG CAPSULE: 500 | 5 days supply | Qty: 20 | Fill #0

## 2016-07-17 MED FILL — INVOKANA 300 MG TABLET: 300 | 90 days supply | Qty: 90 | Fill #1

## 2016-07-17 MED FILL — JANUMET XR 50-1,000 MG TAB: 50-1000 | 90 days supply | Qty: 180 | Fill #1

## 2016-07-25 ENCOUNTER — Ambulatory Visit: Payer: 59 | Admitting: *Deleted

## 2016-08-03 DIAGNOSIS — M65332 Trigger finger, left middle finger: Secondary | ICD-10-CM | POA: Diagnosis not present

## 2016-08-06 ENCOUNTER — Encounter: Payer: Self-pay | Admitting: *Deleted

## 2016-08-06 ENCOUNTER — Other Ambulatory Visit: Payer: Self-pay | Admitting: *Deleted

## 2016-08-07 ENCOUNTER — Encounter: Payer: Self-pay | Admitting: Gastroenterology

## 2016-08-08 NOTE — Patient Outreach (Signed)
[facility]) Care Management   08/08/2016  Laura Valencia 06-19-1961 [id number]  Laura Valencia is an 55 y.o. female who presents to the [facility] Management office with her husband Laura Valencia who is also in the program for routine Link To Wellness follow up for self management assistance with Type II DM, and obesity..  Subjective: Dr. Moshe Valencia says she has lost weight, about 10 lbs, by following Dr. Janene Valencia Program for Reversing Diabetes: The Scientifically Proven System for Reversing Diabetes Without Drugs and by increasing her exercise. She says she also saw a RD in January and her goal for Hgb A1C was 5.7% to 6.0%. She also had Invokana added to her diabetes treatment plan. She said she and her husband have changed primary care provider from Dr. Criss Valencia to Dr. Wolfgang Valencia. She will see him again on 10/12/16. She will have a colonoscopy on 11/19/16 as she has a hx of colon polys.    Objective:   Review of Systems  Constitutional: Negative.    Filed Weights   08/06/16 1151  Weight: 244 lb (110.7 kg)    Physical Exam  Constitutional: She is oriented to person, place, and time. She appears well-developed and well-nourished.  Respiratory: Effort normal.  Neurological: She is alert and oriented to person, place, and time.  Skin: Skin is warm and dry.  Psychiatric: She has a normal mood and affect. Her behavior is normal. Judgment and thought content normal.   Encounter Medications:   Outpatient Encounter Prescriptions as of 08/06/2016  Medication Sig  . aspirin 81 MG tablet Take 81 mg by mouth daily.  . canagliflozin (INVOKANA) 300 MG TABS tablet Take 1 tablet (300 mg total) by mouth daily before breakfast.  . fluticasone (FLONASE) 50 MCG/ACT nasal spray Place 1 spray into both nostrils daily.  Marland Kitchen JANUMET XR 50-1000 MG TB24 One bid   No facility-administered encounter medications on file as of 08/06/2016.     Functional Status:   In  your present state of health, do you have any difficulty performing the following activities: 08/06/2016 11/30/2015  Hearing? N N  Vision? N N  Difficulty concentrating or making decisions? N N  Walking or climbing stairs? Y Y  Dressing or bathing? N N  Doing errands, shopping? N N  Some recent data might be hidden    Fall/Depression Screening:    PHQ 2/9 Scores 08/06/2016 01/23/2016 11/30/2015  PHQ - 2 Score 0 0 0    Assessment:    employee and Link To Wellness member with Type II DM and obesity and hyperlipidemia meeting treatment targets for DM and hyperlipidemia as evidenced by Hgb A1C= 6.4% on 04/04/16 and a normal lipid panel on 04/14/16.  Plan:  [facility] CM Care Plan Problem One   Flowsheet Row Most Recent Value  Care Plan Problem One  Patent with Type II DM, hyperlipidemia and obesity, meeting target Hgb A1C and losing weight with meal planning and increased exercise.  Role Documenting the Problem One  Care Management [place] for Problem One  Active  THN Long Term Goal (31-90 days) Patient will report increased exercise frequency, no weight gain or weight loss, and improved glycemic control as evidenced by Hgb A1C of <6.0% at next assessment and ongoing good control of lipids.  THN Long Term Goal Start Date  08/06/16  Interventions for Problem One Long Term Goal reviewed strategies to improve glycemic control, reviewed exercise opportunities offered by [facility],  offered to assist her with earning her [employer]  badges, reviewed nutritional counseling benefit offered by [facility] and the 01/20/16 session and goals set during that meeting, discussed changes to the Link To Wellness program for 2018 and advised her that the specific changes will be presented during the benefits fair in October,   will arrange for Link To Wellness follow up after she sees Dr. Wolfgang Valencia on 10/12/16      RNCM to fax today's office visit note to Dr. Wolfgang Valencia. RNCM will meet twice  yearly and as needed with patient per Link To Wellness program guidelines to assist with Type II DM and obesity self-management and assess patient's progress toward mutually set goals  Barrington Ellison RN,CCM,CDE [employer] Management Coordinator Link To Wellness Office Phone [phone] Office Fax [phone]

## 2016-09-14 ENCOUNTER — Encounter: Payer: 59 | Admitting: Gastroenterology

## 2016-09-24 ENCOUNTER — Encounter: Payer: 59 | Admitting: Gastroenterology

## 2016-09-27 ENCOUNTER — Other Ambulatory Visit: Payer: Self-pay | Admitting: Family Medicine

## 2016-09-27 DIAGNOSIS — Z1231 Encounter for screening mammogram for malignant neoplasm of breast: Secondary | ICD-10-CM

## 2016-10-05 ENCOUNTER — Encounter: Payer: Self-pay | Admitting: Family Medicine

## 2016-10-05 ENCOUNTER — Ambulatory Visit
Admission: RE | Admit: 2016-10-05 | Discharge: 2016-10-05 | Disposition: A | Discharge status: Home / Self Care | Payer: 59 | Source: Ambulatory Visit | Attending: Family Medicine | Admitting: Family Medicine

## 2016-10-05 DIAGNOSIS — E119 Type 2 diabetes mellitus without complications: Secondary | ICD-10-CM

## 2016-10-05 DIAGNOSIS — Z1231 Encounter for screening mammogram for malignant neoplasm of breast: Secondary | ICD-10-CM | POA: Diagnosis not present

## 2016-10-05 DIAGNOSIS — R5383 Other fatigue: Secondary | ICD-10-CM

## 2016-10-10 DIAGNOSIS — E119 Type 2 diabetes mellitus without complications: Secondary | ICD-10-CM | POA: Diagnosis not present

## 2016-10-10 DIAGNOSIS — R5383 Other fatigue: Secondary | ICD-10-CM | POA: Diagnosis not present

## 2016-10-11 ENCOUNTER — Other Ambulatory Visit: Payer: Self-pay | Admitting: Family Medicine

## 2016-10-11 DIAGNOSIS — R928 Other abnormal and inconclusive findings on diagnostic imaging of breast: Secondary | ICD-10-CM

## 2016-10-11 LAB — BASIC METABOLIC PANEL
BUN / CREAT RATIO: 16 (ref 9–23)
BUN: 14 mg/dL (ref 6–24)
CALCIUM: 9.9 mg/dL (ref 8.7–10.2)
CHLORIDE: 104 mmol/L (ref 96–106)
CO2: 24 mmol/L (ref 18–29)
CREATININE: 0.85 mg/dL (ref 0.57–1.00)
GFR calc Af Amer: 88 mL/min/{1.73_m2} (ref >59)
GFR calc non Af Amer: 76 mL/min/{1.73_m2} (ref >59)
GLUCOSE: 96 mg/dL (ref 65–99)
Potassium: 4.9 mmol/L (ref 3.5–5.2)
Sodium: 143 mmol/L (ref 134–144)

## 2016-10-11 LAB — HEMOGLOBIN A1C
ESTIMATED AVERAGE GLUCOSE: 140 mg/dL
HEMOGLOBIN A1C: 6.5 % — AB (ref 4.8–5.6)

## 2016-10-11 LAB — VITAMIN D 25 HYDROXY (VIT D DEFICIENCY, FRACTURES): VIT D 25 HYDROXY: 28 ng/mL — AB (ref 30.0–100.0)

## 2016-10-12 ENCOUNTER — Ambulatory Visit (INDEPENDENT_AMBULATORY_CARE_PROVIDER_SITE_OTHER): Payer: 59 | Admitting: Family Medicine

## 2016-10-12 ENCOUNTER — Encounter: Payer: Self-pay | Admitting: Family Medicine

## 2016-10-12 VITALS — BP 122/80 | Ht 66.5 in | Wt 239.5 lb

## 2016-10-12 DIAGNOSIS — E119 Type 2 diabetes mellitus without complications: Secondary | ICD-10-CM

## 2016-10-12 DIAGNOSIS — I878 Other specified disorders of veins: Secondary | ICD-10-CM

## 2016-10-12 DIAGNOSIS — G4710 Hypersomnia, unspecified: Secondary | ICD-10-CM

## 2016-10-12 DIAGNOSIS — G4730 Sleep apnea, unspecified: Secondary | ICD-10-CM | POA: Diagnosis not present

## 2016-10-12 DIAGNOSIS — E559 Vitamin D deficiency, unspecified: Secondary | ICD-10-CM

## 2016-10-12 DIAGNOSIS — R7989 Other specified abnormal findings of blood chemistry: Secondary | ICD-10-CM

## 2016-10-12 NOTE — Progress Notes (Signed)
   Subjective:    Patient ID: Laura Valencia, female    DOB: Sep 19, 1961, 55 y.o.   MRN: [id number]  Diabetes  She presents for her follow-up diabetic visit. She has type 2 diabetes mellitus. No MedicAlert identification noted. Eye exam is not current (Due November 2017).   Results for orders placed or performed in visit on [id number]  Basic metabolic panel  Result Value Ref Range   Glucose 96 65 - 99 mg/dL   BUN 14 6 - 24 mg/dL   Creatinine, Ser 0.85 0.57 - 1.00 mg/dL   GFR calc non Af Amer 76 >59 mL/min/1.73   GFR calc Af Amer 88 >59 mL/min/1.73   BUN/Creatinine Ratio 16 9 - 23   Sodium 143 134 - 144 mmol/L   Potassium 4.9 3.5 - 5.2 mmol/L   Chloride 104 96 - 106 mmol/L   CO2 24 18 - 29 mmol/L   Calcium 9.9 8.7 - 10.2 mg/dL  VITAMIN D 25 Hydroxy (Vit-D Deficiency, Fractures)  Result Value Ref Range   Vit D, 25-Hydroxy 28.0 (L) 30.0 - 100.0 ng/mL  HgB A1c  Result Value Ref Range   Hgb A1c MFr Bld 6.5 (H) 4.8 - 5.6 %   Est. average glucose Bld gHb Est-mCnc 140 mg/dL   22 yrs ago intraductal pallilloma  A1c  Vit d 2000 miu nits   Colon due in November  Pap smear last yr  Eye doc visit next month   Patient claims compliance with diabetes medication. No obvious side effects. Reports no substantial low sugar spells. Most numbers are generally in good range when checked fasting. Generally does not miss a dose of medication. Watching diabetic diet closely  Next Friday   Not much cking sugar  Patient states no other concerns this visit.  Review of Systems No headache, no major weight loss or weight gain, no chest pain no back pain abdominal pain no change in bowel habits complete ROS otherwise negative     Objective:   Physical Exam  Alert vitals stable, NAD. Blood pressure good on repeat. HEENT normal. Lungs clear. Heart regular rate and rhythm. Obesity present, venous stasis changes and varicosities noted in lower extremities.      Assessment & Plan:  Impression  1 type 2 diabetes good A1c discussed #2 obesity discussed patient to work harder on diet and exercise. #3 chronic venous stasis discussed patient to wear compression stockings during the week. #4 low vitamin D discuss patient to initiate 2000 milliunits vitamin D daily. Plan diet exercise discussed. Medications refilled. Recheck every 6 months WSL

## 2016-10-19 ENCOUNTER — Ambulatory Visit
Admission: RE | Admit: 2016-10-19 | Discharge: 2016-10-19 | Disposition: A | Discharge status: Home / Self Care | Payer: 59 | Source: Ambulatory Visit | Attending: Family Medicine | Admitting: Family Medicine

## 2016-10-19 DIAGNOSIS — R928 Other abnormal and inconclusive findings on diagnostic imaging of breast: Secondary | ICD-10-CM

## 2016-10-23 ENCOUNTER — Other Ambulatory Visit: Payer: Self-pay | Admitting: Family Medicine

## 2016-10-24 MED FILL — INVOKANA 300 MG TABLET: 300 | 90 days supply | Qty: 90 | Fill #0

## 2016-10-24 MED FILL — JANUMET XR 50-1,000 MG TAB: 50-1000 | 90 days supply | Qty: 180 | Fill #0

## 2016-10-30 ENCOUNTER — Encounter: Payer: Self-pay | Admitting: Family Medicine

## 2016-11-02 DIAGNOSIS — H25813 Combined forms of age-related cataract, bilateral: Secondary | ICD-10-CM | POA: Diagnosis not present

## 2016-11-02 DIAGNOSIS — E119 Type 2 diabetes mellitus without complications: Secondary | ICD-10-CM | POA: Diagnosis not present

## 2016-11-02 MED ORDER — DICLOFENAC SODIUM 1 % TD GEL: TRANSDERMAL | 11 refills | Status: DC

## 2016-11-02 MED FILL — RESTASIS MULTIDOSE 0.05% EY: 0.05 | 90 days supply | Qty: 17 | Fill #0

## 2016-11-02 MED FILL — DICLOFENAC SODIUM 1% GEL: 1 | 25 days supply | Qty: 100 | Fill #0

## 2016-11-09 ENCOUNTER — Ambulatory Visit: Payer: 59 | Admitting: *Deleted

## 2016-11-09 VITALS — Ht 66.0 in | Wt 241.4 lb

## 2016-11-09 DIAGNOSIS — Z86010 Personal history of colonic polyps: Secondary | ICD-10-CM

## 2016-11-09 MED ORDER — SUPREP BOWEL PREP KIT 17.5-3.13-1.6 GM/177ML PO SOLN: 1.0000 | Freq: Once | ORAL | 0 refills | Status: AC

## 2016-11-09 NOTE — Progress Notes (Signed)
Patient denies any allergies to egg or soy products. Patient denies complications with anesthesia/sedation.  Patient denies oxygen use at home and denies diet medications. Emmi instructions for colonoscopy  explained and pamphlet given to patient.  

## 2016-11-12 MED FILL — SUPREP BOWEL PREP KIT: 17.5-3.13-1 | 1 days supply | Qty: 354 | Fill #0

## 2016-11-16 ENCOUNTER — Encounter: Payer: Self-pay | Admitting: Orthopedic Surgery

## 2016-11-16 ENCOUNTER — Ambulatory Visit (INDEPENDENT_AMBULATORY_CARE_PROVIDER_SITE_OTHER): Payer: 59

## 2016-11-16 ENCOUNTER — Ambulatory Visit (INDEPENDENT_AMBULATORY_CARE_PROVIDER_SITE_OTHER): Payer: 59 | Admitting: Orthopedic Surgery

## 2016-11-16 VITALS — BP 132/87 | HR 82 | Wt 237.0 lb

## 2016-11-16 DIAGNOSIS — M546 Pain in thoracic spine: Secondary | ICD-10-CM

## 2016-11-16 NOTE — Patient Instructions (Signed)
CONTINUE IBUPROFEN AND TYLENOL   APPLY HEAT PAD OR CAPZACIN PATCHES OR SALON PAS PATCHES AS NEEDED   MODIFY WORK STATIONS AS NEEDED   PHYSICAL THERAPY UP TO 4 WEEKS 3 X A WEEK
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
## 2016-11-19 ENCOUNTER — Ambulatory Visit (AMBULATORY_SURGERY_CENTER): Payer: 59 | Admitting: Gastroenterology

## 2016-11-19 ENCOUNTER — Encounter: Payer: Self-pay | Admitting: Gastroenterology

## 2016-11-19 VITALS — BP 108/69 | HR 62 | Temp 97.3°F | Resp 14 | Ht 66.0 in | Wt 241.0 lb

## 2016-11-19 DIAGNOSIS — E119 Type 2 diabetes mellitus without complications: Secondary | ICD-10-CM | POA: Diagnosis not present

## 2016-11-19 DIAGNOSIS — Z86010 Personal history of colonic polyps: Secondary | ICD-10-CM

## 2016-11-19 DIAGNOSIS — Z800 Family history of malignant neoplasm of digestive organs: Secondary | ICD-10-CM | POA: Diagnosis not present

## 2016-11-19 LAB — GLUCOSE, CAPILLARY
Glucose-Capillary: 93 mg/dL (ref 65–99)
Glucose-Capillary: 74 mg/dL (ref 65–99)

## 2016-11-19 MED ORDER — SODIUM CHLORIDE 0.9 % IV SOLN: 500.0000 mL | INTRAVENOUS | Status: DC

## 2016-11-19 NOTE — Progress Notes (Signed)
Report to PACU, RN, vss, BBS= Clear.  

## 2016-11-19 NOTE — Patient Instructions (Signed)
Impression/Recommendations:  Repeat colonoscopy in 5 years for surveillance.  YOU HAD AN ENDOSCOPIC PROCEDURE TODAY AT THE [facility]:   Refer to the procedure report that was given to you for any specific questions about what was found during the examination.  If the procedure report does not answer your questions, please call your gastroenterologist to clarify.  If you requested that your care partner not be given the details of your procedure findings, then the procedure report has been included in a sealed envelope for you to review at your convenience later.  YOU SHOULD EXPECT: Some feelings of bloating in the abdomen. Passage of more gas than usual.  Walking can help get rid of the air that was put into your GI tract during the procedure and reduce the bloating. If you had a lower endoscopy (such as a colonoscopy or flexible sigmoidoscopy) you may notice spotting of blood in your stool or on the toilet paper. If you underwent a bowel prep for your procedure, you may not have a normal bowel movement for a few days.  Please Note:  You might notice some irritation and congestion in your nose or some drainage.  This is from the oxygen used during your procedure.  There is no need for concern and it should clear up in a day or so.  SYMPTOMS TO REPORT IMMEDIATELY:   Following lower endoscopy (colonoscopy or flexible sigmoidoscopy):  Excessive amounts of blood in the stool  Significant tenderness or worsening of abdominal pains  Swelling of the abdomen that is new, acute  Fever of 100F or higher  For urgent or emergent issues, a gastroenterologist can be reached at any hour by calling [phone].   DIET:  We do recommend a small meal at first, but then you may proceed to your regular diet.  Drink plenty of fluids but you should avoid alcoholic beverages for 24 hours.  ACTIVITY:  You should plan to take it easy for the rest of today and you should NOT DRIVE or use heavy  machinery until tomorrow (because of the sedation medicines used during the test).    FOLLOW UP: Our staff will call the number listed on your records the next business day following your procedure to check on you and address any questions or concerns that you may have regarding the information given to you following your procedure. If we do not reach you, we will leave a message.  However, if you are feeling well and you are not experiencing any problems, there is no need to return our call.  We will assume that you have returned to your regular daily activities without incident.  If any biopsies were taken you will be contacted by phone or by letter within the next 1-3 weeks.  Please call us at [phone] if you have not heard about the biopsies in 3 weeks.    SIGNATURES/CONFIDENTIALITY: You and/or your care partner have signed paperwork which will be entered into your electronic medical record.  These signatures attest to the fact that that the information above on your After Visit Summary has been reviewed and is understood.  Full responsibility of the confidentiality of this discharge information lies with you and/or your care-partner. 

## 2016-11-19 NOTE — Op Note (Signed)
[facility] Patient Name: Laura Valencia Procedure Date: 11/19/2016 7:19 AM MRN: [id number] Endoscopist: Ladene Artist , MD Age: 55 Referring MD:  Date of Birth: [birth year] Gender: Female Account #: [id number] Procedure:                Colonoscopy Indications:              Surveillance: Personal history of adenomatous                            polyps on last colonoscopy > 5 years ago, Screening                            in patient at increased risk: Family history of                            1st-degree relative with colorectal cancer Medicines:                Monitored Anesthesia Care Procedure:                Pre-Anesthesia Assessment:                           - Prior to the procedure, a History and Physical                            was performed, and patient medications and                            allergies were reviewed. The patient's tolerance of                            previous anesthesia was also reviewed. The risks                            and benefits of the procedure and the sedation                            options and risks were discussed with the patient.                            All questions were answered, and informed consent                            was obtained. Prior Anticoagulants: The patient has                            taken no previous anticoagulant or antiplatelet                            agents. ASA Grade Assessment: II - A patient with                            mild systemic disease. After reviewing the risks  and benefits, the patient was deemed in                            satisfactory condition to undergo the procedure.                           After obtaining informed consent, the colonoscope                            was passed under direct vision. Throughout the                            procedure, the patient's blood pressure, pulse, and                            oxygen saturations  were monitored continuously. The                            Model PCF-H190DL [id number]) scope was introduced                            through the anus and advanced to the the cecum,                            identified by appendiceal orifice and ileocecal                            valve. The ileocecal valve, appendiceal orifice,                            and rectum were photographed. The quality of the                            bowel preparation was good. The colonoscopy was                            performed without difficulty. The patient tolerated                            the procedure well. Scope In: 8:06:55 AM Scope Out: 8:21:47 AM Scope Withdrawal Time: 0 hours 10 minutes 29 seconds  Total Procedure Duration: 0 hours 14 minutes 52 seconds  Findings:                 The perianal and digital rectal examinations were                            normal.                           The entire examined colon appeared normal on direct                            and retroflexion views. Complications:            No immediate complications.  Estimated blood loss:                            None. Estimated Blood Loss:     Estimated blood loss: none. Impression:               - The entire examined colon is normal on direct and                            retroflexion views.                           - No specimens collected. Recommendation:           - Repeat colonoscopy in 5 years for surveillance.                           - Patient has a contact number available for                            emergencies. The signs and symptoms of potential                            delayed complications were discussed with the                            patient. Return to normal activities tomorrow.                            Written discharge instructions were provided to the                            patient.                           - Resume previous diet.                           -  Continue present medications. Ladene Artist, MD 11/19/2016 8:26:51 AM This report has been signed electronically.

## 2016-11-20 ENCOUNTER — Telehealth: Payer: Self-pay

## 2016-11-20 NOTE — Telephone Encounter (Signed)
  Follow up Call-  Call back number 11/19/2016  Post procedure Call Back phone  # [phone]  Permission to leave phone message Yes  Some recent data might be hidden    Patient was called for follow up after his procedure on 11/19/2016. I spoke with the patients husband and he reports that Bee has returned to her normal daily activities without any complications.

## 2016-11-30 ENCOUNTER — Encounter (HOSPITAL_COMMUNITY): Payer: Self-pay | Admitting: Physical Therapy

## 2016-11-30 ENCOUNTER — Ambulatory Visit (HOSPITAL_COMMUNITY): Payer: 59 | Attending: Orthopedic Surgery | Admitting: Physical Therapy

## 2016-11-30 DIAGNOSIS — R293 Abnormal posture: Secondary | ICD-10-CM | POA: Insufficient documentation

## 2016-11-30 DIAGNOSIS — M546 Pain in thoracic spine: Secondary | ICD-10-CM | POA: Diagnosis not present

## 2016-11-30 DIAGNOSIS — M62830 Muscle spasm of back: Secondary | ICD-10-CM | POA: Diagnosis not present
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
## 2017-01-21 MED FILL — INVOKANA 300 MG TABLET: 300 | 90 days supply | Qty: 90 | Fill #1

## 2017-01-21 MED FILL — JANUMET XR 50-1,000 MG TAB: 50-1000 | 30 days supply | Qty: 60 | Fill #1

## 2017-01-29 MED FILL — RESTASIS MULTIDOSE 0.05% EY: 0.05 | 90 days supply | Qty: 17 | Fill #1

## 2017-02-08 DIAGNOSIS — L720 Epidermal cyst: Secondary | ICD-10-CM | POA: Diagnosis not present

## 2017-03-01 DIAGNOSIS — L989 Disorder of the skin and subcutaneous tissue, unspecified: Secondary | ICD-10-CM | POA: Diagnosis not present

## 2017-03-05 MED FILL — JANUMET XR 50-1,000 MG TAB: 50-1000 | 30 days supply | Qty: 60 | Fill #2

## 2017-03-21 LAB — LIPID PANEL
CHOLESTEROL: 200 mg/dL (ref 0–200)
HDL: 60 mg/dL (ref 35–70)
LDL CALC: 126 mg/dL
Triglycerides: 69 mg/dL (ref 40–160)

## 2017-03-21 LAB — TSH: TSH: 1.02 u[IU]/mL (ref <=5.90)

## 2017-03-26 ENCOUNTER — Other Ambulatory Visit: Payer: Self-pay | Admitting: *Deleted

## 2017-03-26 NOTE — Patient Outreach (Signed)
Secure e-mail to Dr. Racheal Patches e-mail address requesting she contact this RNCM to arrange [facility] follow up at [place] office. Await response from  [name].  Barrington Ellison RN,CCM,CDE [facility] [place] Management Coordinator [facility] Office Phone [phone] Office Fax [phone]

## 2017-04-05 ENCOUNTER — Ambulatory Visit (INDEPENDENT_AMBULATORY_CARE_PROVIDER_SITE_OTHER): Payer: 59 | Admitting: "Endocrinology

## 2017-04-05 ENCOUNTER — Encounter: Payer: Self-pay | Admitting: "Endocrinology

## 2017-04-05 VITALS — BP 124/74 | HR 82 | Ht 66.5 in | Wt 248.0 lb

## 2017-04-05 DIAGNOSIS — E782 Mixed hyperlipidemia: Secondary | ICD-10-CM | POA: Diagnosis not present

## 2017-04-05 DIAGNOSIS — E559 Vitamin D deficiency, unspecified: Secondary | ICD-10-CM

## 2017-04-05 DIAGNOSIS — E119 Type 2 diabetes mellitus without complications: Secondary | ICD-10-CM | POA: Diagnosis not present

## 2017-04-05 MED ORDER — ROSUVASTATIN CALCIUM 5 MG PO TABS: 5.0000 mg | ORAL_TABLET | Freq: Every day | ORAL | 3 refills | Status: DC

## 2017-04-05 MED ORDER — EMPAGLIFLOZIN 25 MG PO TABS: 25.0000 mg | ORAL_TABLET | Freq: Every day | ORAL | 3 refills | Status: DC

## 2017-04-05 MED ORDER — PHENTERMINE-TOPIRAMATE ER 7.5-46 MG PO CP24: 1.0000 | ORAL_CAPSULE | Freq: Every day | ORAL | 2 refills | Status: DC

## 2017-04-05 MED ORDER — VITAMIN D3 125 MCG (5000 UT) PO CAPS: 5000.0000 [IU] | ORAL_CAPSULE | Freq: Every day | ORAL | 0 refills | Status: DC

## 2017-04-05 MED ORDER — PHENTERMINE-TOPIRAMATE ER 3.75-23 MG PO CP24: 1.0000 | ORAL_CAPSULE | Freq: Every day | ORAL | 0 refills | Status: DC

## 2017-04-05 MED FILL — ROSUVASTATIN CALCIUM 5 MG T: 5 | 30 days supply | Qty: 30 | Fill #0

## 2017-04-05 NOTE — Progress Notes (Signed)
Subjective:    Patient ID: Laura Valencia, female    DOB: 1961/04/19. Patient is being seen in consultation for management of diabetes requested by  Mickie Hillier, MD  Past Medical History:  Diagnosis Date  . Arthritis    knees  . Colon polyp    TYPE UNKNOWN  . Diabetes mellitus, type 2 ([place])   . Hyperthyroidism    HX; RX IN THE PAST  . OSA (obstructive sleep apnea)    [facility] 06-19-2010 AHI 9.3/HR  . Seasonal rhinitis    MILD  . Sleep apnea    occasional uses CPAP   Past Surgical History:  Procedure Laterality Date  . BREAST LUMPECTOMY     LEFT -BENIGN  . CARPAL TUNNEL RELEASE     BILATERAL  . COLONOSCOPY  11/2010   [facility]  . TRIGGER FINGER RELEASE Left    middle finger   Social History   Social History  . Marital status: Married    Spouse name: N/A  . Number of children: N/A  . Years of education: N/A   Occupational History  . 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 Smoking status: Never Smoker  . Smokeless tobacco: Never Used  . Alcohol use Yes     Comment: RARE USE OF ETOH  . Drug use: No  . Sexual activity: Not Asked   Other Topics Concern  . None   Social History Narrative   ORIGINALLY FROM THE [place].   Outpatient Encounter Prescriptions as of 04/05/2017  Medication Sig  . aspirin 81 MG tablet Take 81 mg by mouth daily.  . cholecalciferol (VITAMIN D) 1000 units tablet Take 1,000 Units by mouth daily.  . Cholecalciferol (VITAMIN D3) 5000 units CAPS Take 1 capsule (5,000 Units total) by mouth daily.  . diclofenac sodium (VOLTAREN) 1 % GEL Apply up to QID to affected area  . empagliflozin (JARDIANCE) 25 MG TABS tablet Take 25 mg by mouth daily.  . fluticasone (FLONASE) 50 MCG/ACT nasal spray Place 1 spray into both nostrils daily.  Marland Kitchen JANUMET XR 50-1000 MG TB24 TAKE TABLET BY MOUTH TWICE DAILY  . Phentermine-Topiramate (QSYMIA) 3.75-23 MG CP24 Take 1 capsule by mouth daily.  . Phentermine-Topiramate  (QSYMIA) 7.5-46 MG CP24 Take 1 capsule by mouth daily.  . rosuvastatin (CRESTOR) 5 MG tablet Take 1 tablet (5 mg total) by mouth daily.  . [DISCONTINUED] INVOKANA 300 MG TABS tablet TAKE 1 TABLET BY MOUTH DAILY BEFORE BREAKFAST.   Facility-Administered Encounter Medications as of 04/05/2017  Medication  . 0.9 %  sodium chloride infusion   ALLERGIES: No Known Allergies VACCINATION STATUS: Immunization History  Administered Date(s) Administered  . Influenza Whole 09/30/2010  . Influenza-Unspecified 10/05/2016  . Td 10/09/2016    Diabetes  She presents for her initial diabetic visit. She has type 2 diabetes mellitus. Onset time: She was diagnosed at approximate age of 60 years. Her disease course has been stable. Pertinent negatives for hypoglycemia include no confusion, headaches, pallor or seizures. Pertinent negatives for diabetes include no chest pain, no fatigue, no polydipsia, no polyphagia and no polyuria. There are no hypoglycemic complications. Symptoms are stable. There are no diabetic complications. Risk factors for coronary artery disease include diabetes mellitus, dyslipidemia and obesity. Current diabetic treatment includes oral agent (dual therapy). She is compliant with treatment all of the time. Her weight is increasing steadily (She has history of body weight up to 276 pounds followed  by progressive weight loss up to 237 pounds last year. Since November 2017 she has gained 10 pounds.). She is following a generally unhealthy diet. She has had a previous visit with a dietitian. She participates in exercise intermittently. (Her most recent A1c was 6.5%.) An ACE inhibitor/angiotensin II receptor blocker is not being taken.  Hyperlipidemia  This is a chronic problem. The current episode started more than 1 year ago. The problem is uncontrolled. Exacerbating diseases include diabetes and obesity. Pertinent negatives include no chest pain, myalgias or shortness of breath. She is currently  on no antihyperlipidemic treatment. The current treatment provides no improvement of lipids. Risk factors for coronary artery disease include dyslipidemia, diabetes mellitus, hypertension and obesity.       Review of Systems  Constitutional: Negative for chills, fatigue, fever and unexpected weight change.  HENT: Negative for trouble swallowing and voice change.   Eyes: Negative for visual disturbance.  Respiratory: Negative for cough, shortness of breath and wheezing.   Cardiovascular: Negative for chest pain, palpitations and leg swelling.  Gastrointestinal: Negative for diarrhea, nausea and vomiting.  Endocrine: Negative for cold intolerance, heat intolerance, polydipsia, polyphagia and polyuria.  Musculoskeletal: Negative for arthralgias and myalgias.  Skin: Negative for color change, pallor, rash and wound.  Neurological: Negative for seizures and headaches.  Psychiatric/Behavioral: Negative for confusion and suicidal ideas.    Objective:    BP 124/74   Pulse 82   Ht 5' 6.5" (1.689 m)   Wt 248 lb (112.5 kg)   LMP 03/19/2012   BMI 39.43 kg/m   Wt Readings from Last 3 Encounters:  04/05/17 248 lb (112.5 kg)  11/19/16 241 lb (109.3 kg)  11/16/16 237 lb (107.5 kg)    Physical Exam  Constitutional: She is oriented to person, place, and time. She appears well-developed.  HENT:  Head: Normocephalic and atraumatic.  Eyes: EOM are normal.  Neck: Normal range of motion. Neck supple. No tracheal deviation present. No thyromegaly present.  Cardiovascular: Normal rate and regular rhythm.   Pulmonary/Chest: Effort normal and breath sounds normal.  Abdominal: Soft. Bowel sounds are normal. There is no tenderness. There is no guarding.  Musculoskeletal: Normal range of motion. She exhibits edema.  She has significant caloric) with venous stasis, on  bilateral lower extremities.  Neurological: She is alert and oriented to person, place, and time. She has normal reflexes. No cranial  nerve deficit. Coordination normal.  Skin: Skin is warm and dry. No rash noted. No erythema. No pallor.  Psychiatric: She has a normal mood and affect. Judgment normal.     CMP ( most recent) CMP     Component Value Date/Time   NA 143 10/10/2016 0803   K 4.9 10/10/2016 0803   CL 104 10/10/2016 0803   CO2 24 10/10/2016 0803   GLUCOSE 96 10/10/2016 0803   GLUCOSE 103 (H) 10/01/2013 1729   BUN 14 10/10/2016 0803   CREATININE 0.85 10/10/2016 0803   CREATININE 0.82 10/01/2013 1729   CALCIUM 9.9 10/10/2016 0803   PROT 7.3 10/01/2013 1729   ALBUMIN 4.5 10/01/2013 1729   AST 18 10/01/2013 1729   ALT 19 10/01/2013 1729   ALKPHOS 66 10/01/2013 1729   BILITOT 0.6 10/01/2013 1729   GFRNONAA 76 10/10/2016 0803   GFRNONAA 81 10/01/2013 1729   GFRAA 88 10/10/2016 0803   GFRAA >89 10/01/2013 1729     Diabetic Labs (most recent): Lab Results  Component Value Date   HGBA1C 6.5 (H) 10/10/2016   HGBA1C 6.4 (  H) 04/14/2016   HGBA1C 6.1 04/13/2016     Lipid Panel ( most recent) Lipid Panel     Component Value Date/Time   CHOL 200 03/21/2017   TRIG 69 03/21/2017   HDL 60 03/21/2017   CHOLHDL 2.6 10/01/2013 1729   VLDL 15 10/01/2013 1729   LDLCALC 126 03/21/2017      Assessment & Plan:   1) Weight management:  - She has history of weighing significantly higher in the past, progressively was able to lose up to 40 pounds until she recently started to regain 10 pounds since November 2017. Hers is is likely related to positive caloric intake/slow metabolism.   I have counseled the patient on diet management and weight loss, by adopting a carbohydrate restricted/protein rich diet. - Suggestion is made for patient to avoid simple carbohydrates   from her diet including Cakes , Desserts, Ice Cream,  Soda (  diet and regular) , Sweet Tea , Candies,  Chips, Cookies, Artificial Sweeteners,  juices, and "Sugar-free" Products .   - I encouraged the patient to switch to  unprocessed or  minimally processed complex starch and increased protein intake (animal or plant source), fruits, and vegetables.  - Patient is advised to stick to a routine mealtimes to eat 3 meals  a day and avoid unnecessary snacks . - She will benefit from one of the recently FDA approved medications for  long-term weight control. - I have discussed and offered therapy with Qsymia. She has no contraindications for its use. - I will start with 3.75/23 mg for 14 days followed by 7.5 mg/46 mg for 12 weeks with a goal of achieving at least 3% body weight loss.   2. Diabetes mellitus without complication ([place])  - Patient has currently controlled asymptomatic type 2 DM since  56 years of age,  with most recent A1c of  6.5 %. Recent labs reviewed, showing normal renal and hepatic function.   - she remains at a high risk for more acute and chronic complications of diabetes which include CAD, CVA, CKD, retinopathy, and neuropathy.  - I have approached patient with the following individualized plan to manage diabetes and patient agrees:   - She does not require insulin treatment at this time. - I will continue Janumet XR 50/1000 mg daily, therapeutically suitable for patient. - Given her bilateral lower extremity venous stass/varicose veins, I will discontinue  Invokana , and switch to Jardiance 25 mg by mouth daily with breakfast.  - Patient will be considered for incretin therapy as appropriate next visit. - Patient specific target  A1c;  LDL, HDL, Triglycerides, and  Waist Circumference were discussed in detail.  3) Hyperlipidemia:  Uncontrolled with recent LDL of 126. - Given her other comorbidities including type 2 diabetes, she would benefit from initiation of statin therapy. She agrees with plan. I would initiate Crestor 5 mg by mouth daily at bedtime to advance if tolerated and if necessary.  4) BP/HTN: Controlled, at 124/74.  She is not on any  therapy at this time. She will be considered for low-dose   ACEI/ARB on subsequent visits if necessary.   5) vitamin D deficiency: She will benefit from increased therapy. I have added vitamin D3 5000 units by mouth daily for 90 days followed by 2000 units daily.  6) Chronic Care/Health Maintenance:  -Patient is  encouraged to continue to stay away from smoking. I have recommended yearly   moderate intensity exercise for up to 60 minutes of walking for  up to 4 days a week.   - 60 minutes of time was spent on the care of this patient , 50% of which was applied for counseling on diabetes complications and their preventions.  - I advised patient to maintain close follow up with Mickie Hillier, MD for primary care needs.  Follow up plan: - Return in about 3 months (around 07/05/2017) for follow up with pre-visit labs.  Glade Lloyd, MD Phone: [phone]  Fax: [phone]   04/05/2017, 12:35 PM

## 2017-04-05 NOTE — Patient Instructions (Signed)

## 2017-04-08 ENCOUNTER — Encounter: Payer: Self-pay | Admitting: *Deleted

## 2017-04-08 ENCOUNTER — Other Ambulatory Visit: Payer: Self-pay | Admitting: *Deleted

## 2017-04-08 NOTE — Patient Outreach (Addendum)
[facility]) Care Management   04/08/2017  Laura Valencia 04/24/1961 [id number]  Laura Valencia is an 56 y.o. female who presents to the [place] [facility] office for routine Link To Wellness follow up for self management assistance with Type II DM, and obesity..  Subjective: [name] says she recently saw Dr. Dorris Fetch for assistance with weight loss as she says she is gaining weight and it is impacting her health. She says her LDL was 126 so Dr. Dorris Fetch started her on Crestor and low dose aspirin. For weight loss he prescribed Qsymia and she will start it today. She was also started on Vi D and her Invokana was changed to [place] because of lower extremity varicose veins and edema. She says her long term weight goal is to weight less than 200 lbs and short term goal is to lose 15 lbs in 3 months.  She said her colonoscopy on 11/19/16 was normal so she will repeat it in 5 years because her Mom had colon cancer.     Objective:   Review of Systems  Constitutional: Negative.    Weight= 243 lbs without shoes  Physical Exam  Constitutional: She is oriented to person, place, and time. She appears well-developed and well-nourished.  Respiratory: Effort normal.  Neurological: She is alert and oriented to person, place, and time.  Skin: Skin is warm and dry.  Psychiatric: She has a normal mood and affect. Her behavior is normal. Judgment and thought content normal.   Encounter Medications:   Outpatient Encounter Prescriptions as of 04/08/2017  Medication Sig  . aspirin 81 MG tablet Take 81 mg by mouth daily.  . cholecalciferol (VITAMIN D) 1000 units tablet Take 1,000 Units by mouth daily.  . Cholecalciferol (VITAMIN D3) 5000 units CAPS Take 1 capsule (5,000 Units total) by mouth daily.  . diclofenac sodium (VOLTAREN) 1 % GEL Apply up to QID to affected area  . empagliflozin (JARDIANCE) 25 MG TABS tablet Take 25 mg by mouth daily.  .  fluticasone (FLONASE) 50 MCG/ACT nasal spray Place 1 spray into both nostrils daily.  Marland Kitchen JANUMET XR 50-1000 MG TB24 TAKE TABLET BY MOUTH TWICE DAILY  . Phentermine-Topiramate (QSYMIA) 3.75-23 MG CP24 Take 1 capsule by mouth daily.  . Phentermine-Topiramate (QSYMIA) 7.5-46 MG CP24 Take 1 capsule by mouth daily.  . rosuvastatin (CRESTOR) 5 MG tablet Take 1 tablet (5 mg total) by mouth daily.   Facility-Administered Encounter Medications as of 04/08/2017  Medication  . 0.9 %  sodium chloride infusion    Functional Status:   In your present state of health, do you have any difficulty performing the following activities: 08/06/2016  Hearing? N  Vision? N  Difficulty concentrating or making decisions? N  Walking or climbing stairs? Y  Dressing or bathing? N  Doing errands, shopping? N  Some recent data might be hidden    Fall/Depression Screening:    PHQ 2/9 Scores 08/06/2016 01/23/2016 11/30/2015  PHQ - 2 Score 0 0 0    Assessment:   [employer] employee and Link To Wellness member with Type II DM and obesity and hyperlipidemia meeting treatment targets for DM as evidenced by Hgb A1C= 6.5% on 10/10/16,  lipid panel on 03/21/17 showed elevated LDL= 126 so Crestor was started. Now on prescription Qsymia for weight loss assistance.   Plan:  [facility] CM Care Plan Problem One   Flowsheet Row Most Recent Value  Care Plan Problem One Patient with Type II  DM, hyperlipidemia and obesity, meeting target Hgb A1C of <7.0% pharmaceutical treatment recently initiated for weight loss and hyperlipidemia as current Body Mass Index= 38.7 and most recent LDL= 126  Role Documenting the Problem One  Care Management [place] for Problem One  Active  [facility] Long Term Goal (31-90 days) Patient will report increased exercise frequency, weight loss, improved glycemic control as evidenced by Hgb A1C of <6.5% at next assessment and improved control of lipids as evidenced by normal lipid profile at next assessment.   [facility] Long Term Goal Start Date  04/08/17  Interventions for Problem One Long Term Goal reviewed strategies to improve glycemic control, reviewed exercise opportunities offered by [facility],  assisted her with earning her [employer]  badges, reviewed medication list and assessed medication taking behavior, discussed short and long term weight loss goals, reviewed changes to the Link To Wellness program for 2018 and advised her that she will be enrolled in the [facility] program as soon as onboarding is resumed      RNCM to fax today's office visit note to [name] and Dr. Dorris Fetch. RNCM will meet twice yearly and as needed with patient per Link To Wellness program guidelines to assist with Type II DM and obesity self-management and assess patient's progress toward mutually set goals  Barrington Ellison RN,CCM,CDE [place] Management Coordinator Link To Wellness Office Phone [phone] Office Fax [phone]

## 2017-04-09 MED FILL — QSYMIA 3.75 MG-23 MG CAP: 3.75-23 | 14 days supply | Qty: 14 | Fill #0

## 2017-04-10 MED FILL — JANUMET XR 50-1,000 MG TAB: 50-1000 | 30 days supply | Qty: 60 | Fill #3

## 2017-04-11 ENCOUNTER — Telehealth: Payer: 59 | Admitting: Physician Assistant

## 2017-04-11 DIAGNOSIS — J019 Acute sinusitis, unspecified: Secondary | ICD-10-CM | POA: Diagnosis not present

## 2017-04-11 DIAGNOSIS — B9689 Other specified bacterial agents as the cause of diseases classified elsewhere: Secondary | ICD-10-CM | POA: Diagnosis not present

## 2017-04-11 MED ORDER — AMOXICILLIN-POT CLAVULANATE 875-125 MG PO TABS: 1.0000 | ORAL_TABLET | Freq: Two times a day (BID) | ORAL | 0 refills | Status: DC

## 2017-04-11 MED FILL — JARDIANCE 25 MG TABLET: 25 | 30 days supply | Qty: 30 | Fill #0

## 2017-04-11 NOTE — Progress Notes (Signed)

## 2017-04-12 ENCOUNTER — Ambulatory Visit: Payer: 59 | Admitting: Family Medicine

## 2017-04-30 MED FILL — QSYMIA 7.5 MG-46 MG CAPSULE: 7.5-46 | 30 days supply | Qty: 30 | Fill #0

## 2017-05-03 ENCOUNTER — Ambulatory Visit: Payer: 59 | Admitting: Family Medicine

## 2017-05-08 MED FILL — RESTASIS 0.05% EYE EMULSION: 0.05 | 90 days supply | Qty: 180 | Fill #0

## 2017-05-09 ENCOUNTER — Telehealth: Payer: Self-pay

## 2017-05-09 ENCOUNTER — Other Ambulatory Visit: Payer: Self-pay | Admitting: "Endocrinology

## 2017-05-09 ENCOUNTER — Other Ambulatory Visit: Payer: Self-pay

## 2017-05-09 MED ORDER — JANUMET XR 50-1000 MG PO TB24: ORAL_TABLET | ORAL | 1 refills | Status: DC

## 2017-05-09 NOTE — Telephone Encounter (Signed)
Pt is requesting that we send Janumet to [facility]. Is it ok to do this? Not sure if we prescribe this. She is requesting a 90 day supply

## 2017-05-09 NOTE — Telephone Encounter (Signed)
Yes, I will take care of it. 

## 2017-05-14 ENCOUNTER — Other Ambulatory Visit: Payer: Self-pay | Admitting: "Endocrinology

## 2017-05-14 MED ORDER — EMPAGLIFLOZIN 25 MG PO TABS: 25.0000 mg | ORAL_TABLET | Freq: Every day | ORAL | 1 refills | Status: DC

## 2017-05-14 MED ORDER — JANUMET XR 50-1000 MG PO TB24: ORAL_TABLET | ORAL | 1 refills | Status: DC

## 2017-05-14 MED ORDER — ROSUVASTATIN CALCIUM 5 MG PO TABS: 5.0000 mg | ORAL_TABLET | Freq: Every day | ORAL | 1 refills | Status: DC

## 2017-05-14 MED FILL — JARDIANCE 25 MG TABLET: 25 | 30 days supply | Qty: 30 | Fill #1

## 2017-05-14 MED FILL — ROSUVASTATIN CALCIUM 5 MG T: 5 | 90 days supply | Qty: 90 | Fill #1

## 2017-05-14 MED FILL — JANUMET XR 50-1,000 MG TAB: 50-1000 | 90 days supply | Qty: 90 | Fill #0 | Status: TO

## 2017-05-29 MED FILL — QSYMIA 7.5 MG-46 MG CAPSULE: 7.5-46 | 30 days supply | Qty: 30 | Fill #1

## 2017-06-28 MED FILL — QSYMIA 7.5 MG-46 MG CAPSULE: 7.5-46 | 30 days supply | Qty: 30 | Fill #2

## 2017-06-28 MED FILL — JARDIANCE 25 MG TABLET: 25 | 30 days supply | Qty: 30 | Fill #2

## 2017-07-02 ENCOUNTER — Other Ambulatory Visit: Payer: Self-pay

## 2017-07-02 ENCOUNTER — Encounter: Payer: Self-pay | Admitting: "Endocrinology

## 2017-07-02 MED ORDER — SITAGLIPTIN PHOS-METFORMIN HCL 50-1000 MG PO TABS: 1.0000 | ORAL_TABLET | Freq: Two times a day (BID) | ORAL | 1 refills | Status: DC

## 2017-07-02 MED FILL — JANUMET XR 50-1,000 MG TAB: 50-1000 | 90 days supply | Qty: 180 | Fill #0

## 2017-07-09 ENCOUNTER — Other Ambulatory Visit: Payer: Self-pay | Admitting: "Endocrinology

## 2017-07-09 DIAGNOSIS — E119 Type 2 diabetes mellitus without complications: Secondary | ICD-10-CM | POA: Diagnosis not present

## 2017-07-10 LAB — T4, FREE: Free T4: 1.2 ng/dL (ref 0.8–1.8)

## 2017-07-10 LAB — HEMOGLOBIN A1C
Hgb A1c MFr Bld: 6.4 % — ABNORMAL HIGH (ref <5.7)
Mean Plasma Glucose: 137 mg/dL

## 2017-07-10 LAB — VITAMIN D 25 HYDROXY (VIT D DEFICIENCY, FRACTURES): VIT D 25 HYDROXY: 60 ng/mL (ref 30–100)

## 2017-07-10 LAB — COMPREHENSIVE METABOLIC PANEL
ALT: 25 U/L (ref 6–29)
AST: 20 U/L (ref 10–35)
Albumin: 4.5 g/dL (ref 3.6–5.1)
Alkaline Phosphatase: 70 U/L (ref 33–130)
BUN: 19 mg/dL (ref 7–25)
CHLORIDE: 108 mmol/L (ref 98–110)
CO2: 19 mmol/L — ABNORMAL LOW (ref 20–31)
CREATININE: 0.8 mg/dL (ref 0.50–1.05)
Calcium: 9.6 mg/dL (ref 8.6–10.4)
GLUCOSE: 113 mg/dL — AB (ref 65–99)
Potassium: 3.8 mmol/L (ref 3.5–5.3)
SODIUM: 141 mmol/L (ref 135–146)
TOTAL PROTEIN: 7.5 g/dL (ref 6.1–8.1)
Total Bilirubin: 0.6 mg/dL (ref 0.2–1.2)

## 2017-07-10 LAB — TSH: TSH: 0.85 m[IU]/L

## 2017-07-12 ENCOUNTER — Ambulatory Visit (INDEPENDENT_AMBULATORY_CARE_PROVIDER_SITE_OTHER): Payer: 59 | Admitting: "Endocrinology

## 2017-07-12 ENCOUNTER — Encounter: Payer: Self-pay | Admitting: "Endocrinology

## 2017-07-12 VITALS — BP 115/75 | HR 88 | Ht 66.5 in | Wt 233.0 lb

## 2017-07-12 DIAGNOSIS — E559 Vitamin D deficiency, unspecified: Secondary | ICD-10-CM

## 2017-07-12 DIAGNOSIS — E119 Type 2 diabetes mellitus without complications: Secondary | ICD-10-CM

## 2017-07-12 DIAGNOSIS — E782 Mixed hyperlipidemia: Secondary | ICD-10-CM

## 2017-07-12 MED ORDER — PHENTERMINE-TOPIRAMATE ER 7.5-46 MG PO CP24: 1.0000 | ORAL_CAPSULE | Freq: Every day | ORAL | 3 refills | Status: DC

## 2017-07-12 NOTE — Progress Notes (Signed)
Subjective:    Patient ID: Laura Valencia, female    DOB: 1961-12-23. Patient is being seen in f/u for management of diabetes requested by  Mikey Kirschner, MD  Past Medical History:  Diagnosis Date  . Arthritis    knees  . Colon polyp    TYPE UNKNOWN  . Diabetes mellitus, type 2 ([place])   . Hyperthyroidism    HX; RX IN THE PAST  . OSA (obstructive sleep apnea)    [facility] 06-19-2010 AHI 9.3/HR  . Seasonal rhinitis    MILD  . Sleep apnea    occasional uses CPAP   Past Surgical History:  Procedure Laterality Date  . BREAST LUMPECTOMY     LEFT -BENIGN  . CARPAL TUNNEL RELEASE     BILATERAL  . COLONOSCOPY  11/2010   stark poylps  . TRIGGER FINGER RELEASE Left    middle finger   Social History   Social History  . Marital status: Married    Spouse name: N/A  . Number of children: N/A  . Years of education: N/A   Occupational History  . 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 Smoking status: Never Smoker  . Smokeless tobacco: Never Used  . Alcohol use Yes     Comment: RARE USE OF ETOH  . Drug use: No  . Sexual activity: Not Asked   Other Topics Concern  . None   Social History Narrative   ORIGINALLY FROM THE [place] [employer].   Outpatient Encounter Prescriptions as of 07/12/2017  Medication Sig  . Phentermine-Topiramate (QSYMIA) 7.5-46 MG CP24 Take 1 capsule by mouth daily.  . sitaGLIPtin-metformin (JANUMET) 50-1000 MG tablet Take 1 tablet by mouth 2 (two) times daily with a meal.  . [DISCONTINUED] Phentermine-Topiramate (QSYMIA) 7.5-46 MG CP24 Take 1 capsule by mouth daily.  Marland Kitchen aspirin 81 MG tablet Take 81 mg by mouth daily.  . diclofenac sodium (VOLTAREN) 1 % GEL Apply up to QID to affected area  . empagliflozin (JARDIANCE) 25 MG TABS tablet Take 25 mg by mouth daily.  . fluticasone (FLONASE) 50 MCG/ACT nasal spray Place 1 spray into both nostrils daily.  . rosuvastatin (CRESTOR) 5 MG tablet Take 1 tablet (5 mg total) by  mouth daily.  . [DISCONTINUED] amoxicillin-clavulanate (AUGMENTIN) 875-125 MG tablet Take 1 tablet by mouth 2 (two) times daily.  . [DISCONTINUED] cholecalciferol (VITAMIN D) 1000 units tablet Take 1,000 Units by mouth daily.  . [DISCONTINUED] Cholecalciferol (VITAMIN D3) 5000 units CAPS Take 1 capsule (5,000 Units total) by mouth daily.  . [DISCONTINUED] Phentermine-Topiramate (QSYMIA) 3.75-23 MG CP24 Take 1 capsule by mouth daily.   Facility-Administered Encounter Medications as of 07/12/2017  Medication  . 0.9 %  sodium chloride infusion   ALLERGIES: No Known Allergies VACCINATION STATUS: Immunization History  Administered Date(s) Administered  . Influenza Whole 09/30/2010  . Influenza-Unspecified 10/05/2016  . Td 10/09/2016    Diabetes  She presents for her follow-up diabetic visit. She has type 2 diabetes mellitus. Onset time: She was diagnosed at approximate age of 44 years. Her disease course has been improving. Pertinent negatives for hypoglycemia include no confusion, headaches, pallor or seizures. Pertinent negatives for diabetes include no chest pain, no fatigue, no polydipsia, no polyphagia and no polyuria. There are no hypoglycemic complications. Symptoms are improving. There are no diabetic complications. Risk factors for coronary artery disease include diabetes mellitus, dyslipidemia and obesity. Current diabetic treatment includes oral agent (dual therapy).  She is compliant with treatment all of the time. Her weight is decreasing steadily (She has history of body weight up to 276 pounds followed by progressive weight loss up to 237 pounds last year. Since last visit, she lost 15 pounds.). She is following a generally unhealthy diet. She has had a previous visit with a dietitian. She participates in exercise intermittently. (Her most recent A1c was 6.5%.) An ACE inhibitor/angiotensin II receptor blocker is not being taken.  Hyperlipidemia  This is a chronic problem. The current  episode started more than 1 year ago. The problem is uncontrolled. Exacerbating diseases include diabetes and obesity. Pertinent negatives include no chest pain, myalgias or shortness of breath. She is currently on no antihyperlipidemic treatment. The current treatment provides no improvement of lipids. Risk factors for coronary artery disease include dyslipidemia, diabetes mellitus, hypertension and obesity.    Review of Systems  Constitutional: Negative for chills, fatigue, fever and unexpected weight change.  HENT: Negative for trouble swallowing and voice change.   Eyes: Negative for visual disturbance.  Respiratory: Negative for cough, shortness of breath and wheezing.   Cardiovascular: Negative for chest pain, palpitations and leg swelling.  Gastrointestinal: Negative for diarrhea, nausea and vomiting.  Endocrine: Negative for cold intolerance, heat intolerance, polydipsia, polyphagia and polyuria.  Musculoskeletal: Negative for arthralgias and myalgias.  Skin: Negative for color change, pallor, rash and wound.  Neurological: Negative for seizures and headaches.  Psychiatric/Behavioral: Negative for confusion and suicidal ideas.    Objective:    BP 115/75   Pulse 88   Ht 5' 6.5" (1.689 m)   Wt 233 lb (105.7 kg)   LMP 03/19/2012   BMI 37.04 kg/m   Wt Readings from Last 3 Encounters:  07/12/17 233 lb (105.7 kg)  04/08/17 243 lb (110.2 kg)  04/05/17 248 lb (112.5 kg)    Physical Exam  Constitutional: She is oriented to person, place, and time. She appears well-developed.  HENT:  Head: Normocephalic and atraumatic.  Eyes: EOM are normal.  Neck: Normal range of motion. Neck supple. No tracheal deviation present. No thyromegaly present.  Cardiovascular: Normal rate and regular rhythm.   Pulmonary/Chest: Effort normal and breath sounds normal.  Abdominal: Soft. Bowel sounds are normal. There is no tenderness. There is no guarding.  Musculoskeletal: Normal range of motion. She  exhibits edema.  She has significant caloric) with venous stasis, on  bilateral lower extremities.  Neurological: She is alert and oriented to person, place, and time. She has normal reflexes. No cranial nerve deficit. Coordination normal.  Skin: Skin is warm and dry. No rash noted. No erythema. No pallor.  Psychiatric: She has a normal mood and affect. Judgment normal.     CMP ( most recent) CMP     Component Value Date/Time   NA 141 07/09/2017 0957   NA 143 10/10/2016 0803   K 3.8 07/09/2017 0957   CL 108 07/09/2017 0957   CO2 19 (L) 07/09/2017 0957   GLUCOSE 113 (H) 07/09/2017 0957   BUN 19 07/09/2017 0957   BUN 14 10/10/2016 0803   CREATININE 0.80 07/09/2017 0957   CALCIUM 9.6 07/09/2017 0957   PROT 7.5 07/09/2017 0957   ALBUMIN 4.5 07/09/2017 0957   AST 20 07/09/2017 0957   ALT 25 07/09/2017 0957   ALKPHOS 70 07/09/2017 0957   BILITOT 0.6 07/09/2017 0957   GFRNONAA 76 10/10/2016 0803   GFRNONAA 81 10/01/2013 1729   GFRAA 88 10/10/2016 0803   GFRAA >89 10/01/2013 1729  Diabetic Labs (most recent): Lab Results  Component Value Date   HGBA1C 6.4 (H) 07/09/2017   HGBA1C 6.5 (H) 10/10/2016   HGBA1C 6.4 (H) 04/14/2016     Lipid Panel ( most recent) Lipid Panel     Component Value Date/Time   CHOL 200 03/21/2017   TRIG 69 03/21/2017   HDL 60 03/21/2017   CHOLHDL 2.6 10/01/2013 1729   VLDL 15 10/01/2013 1729   LDLCALC 126 03/21/2017      Assessment & Plan:   1) Weight management:  - She is responding to lifestyle modification and medical treatment for weight management. She has lost 15 pounds which is approximately 6.5% of her body weight. This is encouraging. - Hers is is likely related to positive caloric intake/slow metabolism.   I have counseled the patient on diet management and weight loss, by adopting a carbohydrate restricted/protein rich diet. - Suggestion is made for patient to avoid simple carbohydrates   from her diet including Cakes ,  Desserts, Ice Cream,  Soda (  diet and regular) , Sweet Tea , Candies,  Chips, Cookies, Artificial Sweeteners,  juices, and "Sugar-free" Products .   - I encouraged the patient to switch to  unprocessed or minimally processed complex starch and increased protein intake (animal or plant source), fruits, and vegetables.  - Patient is advised to stick to a routine mealtimes to eat 3 meals  a day and avoid unnecessary snacks .  - I have discussed and  continued therapy with Qsymia. She has no contraindications for its use. - I will   Continue Qsymia  7.5 mg/46 mg for 12 weeks with a goal of achieving another 5% body weight loss.   2. Diabetes mellitus without complication ([place])  - Patient has currently controlled asymptomatic type 2 DM since  56 years of age,  with most recent A1c of  6.4 %. Recent labs reviewed, showing normal renal and hepatic function.   - she remains at a high risk for more acute and chronic complications of diabetes which include CAD, CVA, CKD, retinopathy, and neuropathy.  - I have approached patient with the following individualized plan to manage diabetes and patient agrees:   - She does not require insulin treatment at this time. - I will continue Janumet XR 50/1000 mg daily, therapeutically suitable for patient. -  Continue  Jardiance 25 mg by mouth daily with breakfast.  - Patient will be considered for incretin therapy as appropriate next visit. - Patient specific target  A1c;  LDL, HDL, Triglycerides, and  Waist Circumference were discussed in detail.  3) Hyperlipidemia:  Uncontrolled with recent LDL of 126. - Given her other comorbidities including type 2 diabetes, she will continue to  benefit from statin therapy. He has tolerated Crestor. I will continue  Crestor 5 mg by mouth daily at bedtime to advance if tolerated and if necessary.  4) BP/HTN: Controlled, at 115/75.  She is not on any  therapy at this time. She will be considered for low-dose  ACEI/ARB on  subsequent visits if necessary.   5) vitamin D deficiency:   Her vitamin D level is not 60. I have advised her to hold off on vitamin D supplements for now. She will have repeat vitamin D informant and when the reassessed for the need of vitamin D supplement.   6) Chronic Care/Health Maintenance:  -Patient is  encouraged to continue to stay away from smoking. I have recommended yearly   moderate intensity exercise for up to  60 minutes of walking for up to 4 days a week.   - 30 minutes of time was spent on the care of this patient , 50% of which was applied for counseling on diabetes complications and their preventions.  - I advised patient to maintain close follow up with Mikey Kirschner, MD for primary care needs.  Follow up plan: - Return in about 4 months (around 11/12/2017) for follow up with pre-visit labs.  Glade Lloyd, MD Phone: [phone]  Fax: [phone]   07/12/2017, 12:21 PM

## 2017-08-01 MED FILL — RESTASIS 0.05% EYE EMULSION: 0.05 | 90 days supply | Qty: 180 | Fill #1

## 2017-08-06 ENCOUNTER — Telehealth: Payer: Self-pay | Admitting: *Deleted

## 2017-08-06 MED FILL — QSYMIA 7.5 MG-46 MG CAPSULE: 7.5-46 | 30 days supply | Qty: 30 | Fill #0

## 2017-08-06 NOTE — Telephone Encounter (Signed)
Patient called this AM checking on PA For Qsymia

## 2017-08-06 NOTE — Telephone Encounter (Signed)
Prior Authorization Approved  For a Maxium 12 fills from 08/05/2017 to 08/04/2018.  Request approved for 1 capsule  Per day.  Patient notified of approval.

## 2017-08-09 ENCOUNTER — Other Ambulatory Visit: Payer: Self-pay | Admitting: Family Medicine

## 2017-08-09 DIAGNOSIS — Z1231 Encounter for screening mammogram for malignant neoplasm of breast: Secondary | ICD-10-CM

## 2017-08-09 MED FILL — JARDIANCE 25 MG TABLET: 25 | 30 days supply | Qty: 30 | Fill #3

## 2017-09-09 ENCOUNTER — Other Ambulatory Visit: Payer: Self-pay | Admitting: "Endocrinology

## 2017-09-09 MED FILL — QSYMIA 7.5 MG-46 MG CAPSULE: 7.5-46 | 30 days supply | Qty: 30 | Fill #1

## 2017-09-10 MED FILL — JARDIANCE 25 MG TABLET: 25 | 30 days supply | Qty: 30 | Fill #0

## 2017-09-10 MED FILL — ROSUVASTATIN CALCIUM 5 MG T: 5 | 90 days supply | Qty: 90 | Fill #0

## 2017-10-07 MED FILL — JANUMET XR 50-1,000 MG TAB: 50-1000 | 90 days supply | Qty: 180 | Fill #1 | Status: TO

## 2017-10-07 MED FILL — JARDIANCE 25 MG TABLET: 25 | 90 days supply | Qty: 90 | Fill #1

## 2017-10-07 MED FILL — QSYMIA 7.5 MG-46 MG CAPSULE: 7.5-46 | 30 days supply | Qty: 30 | Fill #2 | Status: TO

## 2017-10-11 ENCOUNTER — Ambulatory Visit: Payer: 59

## 2017-10-30 MED FILL — RESTASIS 0.05% EYE EMULSION: 0.05 | 90 days supply | Qty: 180 | Fill #2

## 2017-11-06 DIAGNOSIS — E559 Vitamin D deficiency, unspecified: Secondary | ICD-10-CM | POA: Diagnosis not present

## 2017-11-06 DIAGNOSIS — E119 Type 2 diabetes mellitus without complications: Secondary | ICD-10-CM | POA: Diagnosis not present

## 2017-11-07 LAB — RENAL FUNCTION PANEL
Albumin: 4.5 g/dL (ref 3.6–5.1)
BUN: 23 mg/dL (ref 7–25)
CO2: 26 mmol/L (ref 20–32)
CREATININE: 0.8 mg/dL (ref 0.50–1.05)
Calcium: 9.5 mg/dL (ref 8.6–10.4)
Chloride: 107 mmol/L (ref 98–110)
Glucose, Bld: 90 mg/dL (ref 65–99)
POTASSIUM: 3.9 mmol/L (ref 3.5–5.3)
Phosphorus: 3.4 mg/dL (ref 2.5–4.5)
SODIUM: 140 mmol/L (ref 135–146)

## 2017-11-07 LAB — HEMOGLOBIN A1C
EAG (MMOL/L): 7.7 (calc)
Hgb A1c MFr Bld: 6.5 % of total Hgb — ABNORMAL HIGH (ref <5.7)
Mean Plasma Glucose: 140 (calc)

## 2017-11-07 LAB — VITAMIN D 25 HYDROXY (VIT D DEFICIENCY, FRACTURES): Vit D, 25-Hydroxy: 41 ng/mL (ref 30–100)

## 2017-11-08 ENCOUNTER — Ambulatory Visit (INDEPENDENT_AMBULATORY_CARE_PROVIDER_SITE_OTHER): Payer: 59 | Admitting: "Endocrinology

## 2017-11-08 ENCOUNTER — Encounter: Payer: Self-pay | Admitting: "Endocrinology

## 2017-11-08 VITALS — BP 122/85 | HR 61 | Ht 66.5 in | Wt 224.0 lb

## 2017-11-08 DIAGNOSIS — E119 Type 2 diabetes mellitus without complications: Secondary | ICD-10-CM | POA: Diagnosis not present

## 2017-11-08 DIAGNOSIS — E782 Mixed hyperlipidemia: Secondary | ICD-10-CM | POA: Diagnosis not present

## 2017-11-08 MED ORDER — SITAGLIPTIN PHOS-METFORMIN HCL 50-1000 MG PO TABS: 1.0000 | ORAL_TABLET | Freq: Two times a day (BID) | ORAL | 4 refills | Status: DC

## 2017-11-08 MED ORDER — PHENTERMINE-TOPIRAMATE ER 7.5-46 MG PO CP24: 1.0000 | ORAL_CAPSULE | Freq: Every day | ORAL | 3 refills | Status: DC

## 2017-11-08 MED ORDER — ROSUVASTATIN CALCIUM 5 MG PO TABS: 5.0000 mg | ORAL_TABLET | Freq: Every day | ORAL | 3 refills | Status: DC

## 2017-11-08 MED FILL — QSYMIA 7.5 MG-46 MG CAPSULE: 7.5-46 | 30 days supply | Qty: 30 | Fill #0

## 2017-11-08 NOTE — Progress Notes (Signed)
Subjective:    Patient ID: Laura Valencia, female    DOB: [birth year]. Patient is being seen in f/u for management of diabetes requested by  Mikey Kirschner, MD  Past Medical History:  Diagnosis Date  . Arthritis    knees  . Colon polyp    TYPE UNKNOWN  . Diabetes mellitus, type 2 ([place])   . Hyperthyroidism    HX; RX IN THE PAST  . OSA (obstructive sleep apnea)    [facility] 06-19-2010 AHI 9.3/HR  . Seasonal rhinitis    MILD  . Sleep apnea    occasional uses CPAP   Past Surgical History:  Procedure Laterality Date  . BREAST LUMPECTOMY     LEFT -BENIGN  . CARPAL TUNNEL RELEASE     BILATERAL  . COLONOSCOPY  11/2010   stark poylps  . TRIGGER FINGER RELEASE Left    middle finger   Social History   Socioeconomic History  . Marital status: Married    Spouse name: None  . Number of children: None  . Years of education: None  . Highest education level: None  Social Needs  . Financial resource strain: None  . Food insecurity - worry: None  . Food insecurity - inability: None  . Transportation needs - medical: None  . Transportation needs - non-medical: None  Occupational History  . 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 Smoking status: Never Smoker  . Smokeless tobacco: Never Used  Substance and Sexual Activity  . Alcohol use: Yes    Comment: RARE USE OF ETOH  . Drug use: No  . Sexual activity: None  Other Topics Concern  . None  Social History Narrative   ORIGINALLY FROM THE [place]IT[place].   Outpatient Encounter Medications as of 11/08/2017  Medication Sig  . aspirin 81 MG tablet Take 81 mg by mouth daily.  . diclofenac sodium (VOLTAREN) 1 % GEL Apply up to QID to affected area  . fluticasone (FLONASE) 50 MCG/ACT nasal spray Place 1 spray into both nostrils daily.  . Phentermine-Topiramate (QSYMIA) 7.5-46 MG CP24 Take 1 capsule daily by mouth.  . rosuvastatin (CRESTOR) 5 MG tablet Take 1 tablet (5 mg  total) daily by mouth.  . sitaGLIPtin-metformin (JANUMET) 50-1000 MG tablet Take 1 tablet 2 (two) times daily with a meal by mouth.  . [DISCONTINUED] empagliflozin (JARDIANCE) 25 MG TABS tablet Take 25 mg by mouth daily.  . [DISCONTINUED] JARDIANCE 25 MG TABS tablet TAKE 1 TABLET BY MOUTH ONCE DAILY  . [DISCONTINUED] Phentermine-Topiramate (QSYMIA) 7.5-46 MG CP24 Take 1 capsule by mouth daily.  . [DISCONTINUED] rosuvastatin (CRESTOR) 5 MG tablet Take 1 tablet (5 mg total) by mouth daily.  . [DISCONTINUED] rosuvastatin (CRESTOR) 5 MG tablet TAKE 1 TABLET (5 MG TOTAL) BY MOUTH DAILY.  . [DISCONTINUED] sitaGLIPtin-metformin (JANUMET) 50-1000 MG tablet Take 1 tablet by mouth 2 (two) times daily with a meal.   Facility-Administered Encounter Medications as of 11/08/2017  Medication  . 0.9 %  sodium chloride infusion   ALLERGIES: No Known Allergies VACCINATION STATUS: Immunization History  Administered Date(s) Administered  . Influenza Whole 09/30/2010  . Influenza-Unspecified 10/05/2016  . Td 10/09/2016    Diabetes  She presents for her follow-up diabetic visit. She has type 2 diabetes mellitus. Onset time: She was diagnosed at approximate age of 33 years. Her disease course has been stable. Pertinent negatives for hypoglycemia include no confusion, headaches, pallor  or seizures. Pertinent negatives for diabetes include no chest pain, no fatigue, no polydipsia, no polyphagia and no polyuria. There are no hypoglycemic complications. Symptoms are stable. There are no diabetic complications. Risk factors for coronary artery disease include diabetes mellitus, dyslipidemia and obesity. Current diabetic treatment includes oral agent (dual therapy). She is compliant with treatment all of the time. Her weight is decreasing steadily (She has history of body weight up to 276 pounds followed by progressive weight loss  to 237 pounds last year. more recently sh e lost 24 lbs.). She is following a generally  unhealthy diet. She has had a previous visit with a dietitian. She participates in exercise intermittently. (Her most recent A1c was 6.5%.) An ACE inhibitor/angiotensin II receptor blocker is not being taken.  Hyperlipidemia  This is a chronic problem. The current episode started more than 1 year ago. The problem is uncontrolled. Exacerbating diseases include diabetes and obesity. Pertinent negatives include no chest pain, myalgias or shortness of breath. Current antihyperlipidemic treatment includes statins. The current treatment provides no improvement of lipids. Risk factors for coronary artery disease include dyslipidemia, diabetes mellitus, hypertension and obesity.    Review of Systems  Constitutional: Negative for chills, fatigue, fever and unexpected weight change.  HENT: Negative for trouble swallowing and voice change.   Eyes: Negative for visual disturbance.  Respiratory: Negative for cough, shortness of breath and wheezing.   Cardiovascular: Negative for chest pain, palpitations and leg swelling.  Gastrointestinal: Negative for diarrhea, nausea and vomiting.  Endocrine: Negative for cold intolerance, heat intolerance, polydipsia, polyphagia and polyuria.  Musculoskeletal: Negative for arthralgias and myalgias.  Skin: Negative for color change, pallor, rash and wound.  Neurological: Negative for seizures and headaches.  Psychiatric/Behavioral: Negative for confusion and suicidal ideas.    Objective:    BP 122/85   Pulse 61   Ht 5' 6.5" (1.689 m)   Wt 224 lb (101.6 kg)   LMP 03/19/2012   BMI 35.61 kg/m   Wt Readings from Last 3 Encounters:  11/08/17 224 lb (101.6 kg)  07/12/17 233 lb (105.7 kg)  04/08/17 243 lb (110.2 kg)    Physical Exam  Constitutional: She is oriented to person, place, and time. She appears well-developed.  HENT:  Head: Normocephalic and atraumatic.  Eyes: EOM are normal.  Neck: Normal range of motion. Neck supple. No tracheal deviation present. No  thyromegaly present.  Cardiovascular: Normal rate and regular rhythm.  Pulmonary/Chest: Effort normal and breath sounds normal.  Abdominal: Soft. Bowel sounds are normal. There is no tenderness. There is no guarding.  Musculoskeletal: Normal range of motion. She exhibits edema.  She has significant caloric) with venous stasis, on  bilateral lower extremities.  Neurological: She is alert and oriented to person, place, and time. She has normal reflexes. No cranial nerve deficit. Coordination normal.  Skin: Skin is warm and dry. No rash noted. No erythema. No pallor.  Psychiatric: She has a normal mood and affect. Judgment normal.     CMP ( most recent) CMP     Component Value Date/Time   NA 140 11/06/2017 1216   NA 143 10/10/2016 0803   K 3.9 11/06/2017 1216   CL 107 11/06/2017 1216   CO2 26 11/06/2017 1216   GLUCOSE 90 11/06/2017 1216   BUN 23 11/06/2017 1216   BUN 14 10/10/2016 0803   CREATININE 0.80 11/06/2017 1216   CALCIUM 9.5 11/06/2017 1216   PROT 7.5 07/09/2017 0957   ALBUMIN 4.5 07/09/2017 0957   AST  20 07/09/2017 0957   ALT 25 07/09/2017 0957   ALKPHOS 70 07/09/2017 0957   BILITOT 0.6 07/09/2017 0957   GFRNONAA 76 10/10/2016 0803   GFRNONAA 81 10/01/2013 1729   GFRAA 88 10/10/2016 0803   GFRAA >89 10/01/2013 1729     Diabetic Labs (most recent): Lab Results  Component Value Date   HGBA1C 6.5 (H) 11/06/2017   HGBA1C 6.4 (H) 07/09/2017   HGBA1C 6.5 (H) 10/10/2016     Lipid Panel ( most recent) Lipid Panel     Component Value Date/Time   CHOL 200 03/21/2017   TRIG 69 03/21/2017   HDL 60 03/21/2017   CHOLHDL 2.6 10/01/2013 1729   VLDL 15 10/01/2013 1729   LDLCALC 126 03/21/2017      Assessment & Plan:   1) Weight management:  - She is responding to lifestyle modification and medical treatment for weight management. She has lost 24 pounds which is  12% of her recent  body weight. This is encouraging. - Hers is is likely related to positive caloric  intake/slow metabolism.   I have counseled the patient on diet management and weight loss, by adopting a carbohydrate restricted/protein rich diet.  -  Suggestion is made for her to avoid simple carbohydrates  from her diet including Cakes, Sweet Desserts / Pastries, Ice Cream, Soda (diet and regular), Sweet Tea, Candies, Chips, Cookies, Store Bought Juices, Alcohol in Excess of  1-2 drinks a day, Artificial Sweeteners, and "Sugar-free" Products. This will help patient to have stable blood glucose profile and potentially avoid unintended weight gain.    - I encouraged the patient to switch to  unprocessed or minimally processed complex starch and increased protein intake (animal or plant source), fruits, and vegetables.  - Patient is advised to stick to a routine mealtimes to eat 3 meals  a day and avoid unnecessary snacks .  - I have discussed and  continued therapy with Qsymia. She has no contraindications for its use. -  She has benefited from [place]. I advised her to continue  7.5 mg/46 mg for 12 weeks with a goal of achieving another 5% body weight loss.   2. Diabetes mellitus without complication ([place])  - She has currently controlled asymptomatic type 2 DM since  57 years of age,  with most recent A1c of  6.5 %. Recent labs reviewed, showing normal renal and hepatic function.   - she remains at a high risk for more acute and chronic complications of diabetes which include CAD, CVA, CKD, retinopathy, and neuropathy.  - I have approached patient with the following individualized plan to manage diabetes and patient agrees:   - She does not require insulin treatment at this time. - I will continue Janumet  50/1000 mg po BID daily, therapeutically suitable for patient. -  I will discontinue  Jardiance .  - Patient will be considered for weekly  incretin therapy as appropriate next visit. - Patient specific target  A1c;  LDL, HDL, Triglycerides, and  Waist Circumference were  discussed in detail.  3) Hyperlipidemia:  Uncontrolled with recent LDL of 126. - Given her other comorbidities including type 2 diabetes, she will continue to  benefit from statin therapy. She has tolerated Crestor. I will continue  Crestor 5 mg by mouth daily at bedtime to advance if tolerated and if necessary.  4) BP/HTN: Controlled, at 122/85.  She is not on any  therapy at this time. She will be considered for low-dose  ACEI/ARB  on subsequent visits if necessary.   5) vitamin D deficiency:   Her vitamin D level is not 41. I have advised her to resume OTC vitamin D 3 , 2000 units daily x 90 days.  6) Chronic Care/Health Maintenance:  -Patient is  encouraged to stay away from smoking. I have recommended   moderate intensity exercise for up to 60 minutes of walking for up to 4 days a week.   - I advised patient to maintain close follow up with Mikey Kirschner, MD for primary care needs.  Follow up plan: - Return in about 4 months (around 03/08/2018) for follow up with pre-visit labs.  Glade Lloyd, MD Phone: [phone]  Fax: [phone]  -  This note was partially dictated with voice recognition software. Similar sounding words can be transcribed inadequately or may not  be corrected upon review.  11/08/2017, 11:26 AM

## 2017-11-15 ENCOUNTER — Ambulatory Visit: Payer: 59 | Admitting: "Endocrinology

## 2017-11-15 ENCOUNTER — Ambulatory Visit
Admission: RE | Admit: 2017-11-15 | Discharge: 2017-11-15 | Disposition: A | Discharge status: Home / Self Care | Payer: 59 | Source: Ambulatory Visit | Attending: Family Medicine | Admitting: Family Medicine

## 2017-11-15 DIAGNOSIS — Z1231 Encounter for screening mammogram for malignant neoplasm of breast: Secondary | ICD-10-CM

## 2017-11-25 ENCOUNTER — Other Ambulatory Visit: Payer: Self-pay | Admitting: *Deleted

## 2017-11-25 NOTE — Patient Outreach (Addendum)
No response to secure e-mail sent to patient's [facility] e-mail address on 11/19 regarding changes to the [facility] program for 2019. Left message on her cell phone today advising her that disease self-management services will be transitioned from the [facility] program to either [facility] or [facility] in 2019. Also advised her that she will receive a letter mailed to her home address with details of the transition.                                                         Will close case to [facility] diabetes program. Barrington Ellison RN,CCM,CDE [place] Management Coordinator [facility] and [employer] [phone] Office Fax [phone].

## 2017-11-28 ENCOUNTER — Other Ambulatory Visit: Payer: Self-pay | Admitting: *Deleted

## 2017-11-28 NOTE — Patient Outreach (Signed)
Received reply e-mail from [name] on 11/26/17 at 1:34 pm acknowledging receipt of previous e-mails from this [facility] and thanking this RNCM for the services provided through the [place] RN,CCM,CDE [place] Management Coordinator [facility] and [employer] [phone] Office Fax [phone]

## 2017-12-17 MED FILL — QSYMIA 7.5 MG-46 MG CAPSULE: 7.5-46 | 30 days supply | Qty: 30 | Fill #0

## 2018-01-03 DIAGNOSIS — N814 Uterovaginal prolapse, unspecified: Secondary | ICD-10-CM | POA: Diagnosis not present

## 2018-01-03 DIAGNOSIS — Z01419 Encounter for gynecological examination (general) (routine) without abnormal findings: Secondary | ICD-10-CM | POA: Diagnosis not present

## 2018-01-09 MED FILL — JANUMET XR 50-1,000 MG TAB: 50-1000 | 90 days supply | Qty: 90 | Fill #0

## 2018-01-09 MED FILL — SHIPPING COST: 1 days supply | Qty: 1 | Fill #0

## 2018-01-09 MED FILL — ROSUVASTATIN CALCIUM 5 MG T: 5 | 90 days supply | Qty: 90 | Fill #0

## 2018-01-16 MED FILL — QSYMIA 7.5 MG-46 MG CAPSULE: 7.5-46 | 30 days supply | Qty: 30 | Fill #1

## 2018-01-16 MED FILL — SHIPPING COST: 1 days supply | Qty: 1 | Fill #1

## 2018-02-28 MED FILL — QSYMIA 7.5 MG-46 MG CAPSULE: 7.5-46 | 30 days supply | Qty: 30 | Fill #2

## 2018-02-28 MED FILL — SHIPPING COST: 1 days supply | Qty: 1 | Fill #2

## 2018-03-10 ENCOUNTER — Other Ambulatory Visit: Payer: Self-pay | Admitting: "Endocrinology

## 2018-03-10 ENCOUNTER — Telehealth: Payer: Self-pay

## 2018-03-10 MED ORDER — METFORMIN HCL 1000 MG PO TABS
1000.0000 mg | ORAL_TABLET | Freq: Two times a day (BID) | ORAL | 0 refills | Status: DC
Start: 2018-03-10 — End: 2018-03-28

## 2018-03-10 NOTE — Telephone Encounter (Signed)
-----   Message from Fayrene Helper, MD sent at 03/10/2018  2:24 PM EDT ----- Regarding: help please , personal! [place]!  Believe it or not,  I LOST 6 weeks of janumet in my home!!!!  PLEASE help me by sending a 90 day supply of metformin to [place] , [facility], 1000 mg twice daily, I will pay out of pocket.  I hope this is OK Doc, I PROMISE I will be more responsible with my medication in the future , I have looked for the past 5 days, cannot find my medication!!!,   Please let me know  Thanks in advance!

## 2018-03-10 NOTE — Telephone Encounter (Signed)
Done  Rx sent

## 2018-03-14 ENCOUNTER — Ambulatory Visit: Payer: 59 | Admitting: "Endocrinology

## 2018-03-21 DIAGNOSIS — E119 Type 2 diabetes mellitus without complications: Secondary | ICD-10-CM | POA: Diagnosis not present

## 2018-03-21 DIAGNOSIS — H524 Presbyopia: Secondary | ICD-10-CM | POA: Diagnosis not present

## 2018-03-21 LAB — HM DIABETES EYE EXAM

## 2018-03-21 MED FILL — SHIPPING COST: 1 days supply | Qty: 1 | Fill #3

## 2018-03-21 MED FILL — RESTASIS 0.05% EYE EMULSION: 0.05 | 90 days supply | Qty: 180 | Fill #0

## 2018-03-26 DIAGNOSIS — E119 Type 2 diabetes mellitus without complications: Secondary | ICD-10-CM | POA: Diagnosis not present

## 2018-03-26 DIAGNOSIS — E782 Mixed hyperlipidemia: Secondary | ICD-10-CM | POA: Diagnosis not present

## 2018-03-27 LAB — LIPID PANEL
CHOLESTEROL: 138 mg/dL (ref <200)
HDL: 62 mg/dL (ref >50)
LDL CHOLESTEROL (CALC): 62 mg/dL
Non-HDL Cholesterol (Calc): 76 mg/dL (calc) (ref <130)
Total CHOL/HDL Ratio: 2.2 (calc) (ref <5.0)
Triglycerides: 61 mg/dL (ref <150)

## 2018-03-27 LAB — HEMOGLOBIN A1C
EAG (MMOL/L): 8.1 (calc)
Hgb A1c MFr Bld: 6.7 % of total Hgb — ABNORMAL HIGH (ref <5.7)
Mean Plasma Glucose: 146 (calc)

## 2018-03-27 LAB — COMPLETE METABOLIC PANEL WITH GFR
AG Ratio: 1.5 (calc) (ref 1.0–2.5)
ALBUMIN MSPROF: 4.1 g/dL (ref 3.6–5.1)
ALKALINE PHOSPHATASE (APISO): 68 U/L (ref 33–130)
ALT: 27 U/L (ref 6–29)
AST: 21 U/L (ref 10–35)
BILIRUBIN TOTAL: 0.6 mg/dL (ref 0.2–1.2)
BUN: 15 mg/dL (ref 7–25)
CHLORIDE: 110 mmol/L (ref 98–110)
CO2: 23 mmol/L (ref 20–32)
Calcium: 9.4 mg/dL (ref 8.6–10.4)
Creat: 0.81 mg/dL (ref 0.50–1.05)
GFR, Est African American: 93 mL/min/{1.73_m2} (ref >=60)
GFR, Est Non African American: 80 mL/min/{1.73_m2} (ref >=60)
GLUCOSE: 122 mg/dL — AB (ref 65–99)
Globulin: 2.7 g/dL (calc) (ref 1.9–3.7)
Potassium: 4.1 mmol/L (ref 3.5–5.3)
SODIUM: 141 mmol/L (ref 135–146)
Total Protein: 6.8 g/dL (ref 6.1–8.1)

## 2018-03-28 ENCOUNTER — Encounter: Payer: Self-pay | Admitting: "Endocrinology

## 2018-03-28 ENCOUNTER — Ambulatory Visit: Payer: 59 | Admitting: "Endocrinology

## 2018-03-28 VITALS — BP 117/81 | HR 80 | Ht 66.5 in | Wt 235.0 lb

## 2018-03-28 DIAGNOSIS — E119 Type 2 diabetes mellitus without complications: Secondary | ICD-10-CM | POA: Diagnosis not present

## 2018-03-28 DIAGNOSIS — E782 Mixed hyperlipidemia: Secondary | ICD-10-CM | POA: Diagnosis not present

## 2018-03-28 MED ORDER — PHENTERMINE-TOPIRAMATE ER 15-92 MG PO CP24: 15.0000 mg | ORAL_CAPSULE | Freq: Every day | ORAL | 1 refills | Status: DC

## 2018-03-28 NOTE — Progress Notes (Signed)
Subjective:    Patient ID: Laura Valencia, female    DOB: 15-Jun-1961. Patient is being seen in f/u for management of diabetes requested by  Mikey Kirschner, MD  Past Medical History:  Diagnosis Date  . Arthritis    knees  . Colon polyp    TYPE UNKNOWN  . Diabetes mellitus, type 2 ([place])   . Hyperthyroidism    HX; RX IN THE PAST  . OSA (obstructive sleep apnea)    [facility] 06-19-2010 AHI 9.3/HR  . Seasonal rhinitis    MILD  . Sleep apnea    occasional uses CPAP   Past Surgical History:  Procedure Laterality Date  . BREAST LUMPECTOMY     LEFT -BENIGN  . CARPAL TUNNEL RELEASE     BILATERAL  . COLONOSCOPY  11/2010   stark poylps  . TRIGGER FINGER RELEASE Left    middle finger   Social History   Socioeconomic History  . Marital status: Married    Spouse name: Not on file  . Number of children: Not on file  . Years of education: Not on file  . Highest education level: Not on file  Occupational History  . 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 Financial resource strain: Not on file  . Food insecurity:    Worry: Not on file    Inability: Not on file  . Transportation needs:    Medical: Not on file    Non-medical: Not on file  Tobacco Use  . Smoking status: Never Smoker  . Smokeless tobacco: Never Used  Substance and Sexual Activity  . Alcohol use: Yes    Comment: RARE USE OF ETOH  . Drug use: No  . Sexual activity: Not on file  Lifestyle  . Physical activity:    Days per week: Not on file    Minutes per session: Not on file  . Stress: Not on file  Relationships  . Social connections:    Talks on phone: Not on file    Gets together: Not on file    Attends religious service: Not on file    Active member of club or organization: Not on file    Attends meetings of clubs or organizations: Not on file    Relationship status: Not on file  Other Topics Concern  . Not on file  Social History Narrative    ORIGINALLY [place].   Outpatient Encounter Medications as of 03/28/2018  Medication Sig  . aspirin 81 MG tablet Take 81 mg by mouth daily.  . diclofenac sodium (VOLTAREN) 1 % GEL Apply up to QID to affected area  . fluticasone (FLONASE) 50 MCG/ACT nasal spray Place 1 spray into both nostrils daily.  . Phentermine-Topiramate 15-92 MG CP24 Take 15 mg by mouth daily.  . rosuvastatin (CRESTOR) 5 MG tablet Take 1 tablet (5 mg total) daily by mouth.  . sitaGLIPtin-metformin (JANUMET) 50-1000 MG tablet Take 1 tablet 2 (two) times daily with a meal by mouth.  . [DISCONTINUED] metFORMIN (GLUCOPHAGE) 1000 MG tablet Take 1 tablet (1,000 mg total) by mouth 2 (two) times daily with a meal.  . [DISCONTINUED] Phentermine-Topiramate (QSYMIA) 7.5-46 MG CP24 Take 1 capsule daily by mouth.   Facility-Administered Encounter Medications as of 03/28/2018  Medication  . 0.9 %  sodium chloride infusion   ALLERGIES: No Known Allergies VACCINATION STATUS: Immunization History  Administered Date(s) Administered  . Influenza  Whole 09/30/2010  . Influenza-Unspecified 10/05/2016  . Td 10/09/2016    Diabetes  She presents for her follow-up diabetic visit. She has type 2 diabetes mellitus. Onset time: She was diagnosed at approximate age of 73 years. Her disease course has been stable. Pertinent negatives for hypoglycemia include no confusion, headaches, pallor or seizures. Pertinent negatives for diabetes include no chest pain, no fatigue, no polydipsia, no polyphagia and no polyuria. There are no hypoglycemic complications. Symptoms are stable. There are no diabetic complications. Risk factors for coronary artery disease include diabetes mellitus, dyslipidemia and obesity. Current diabetic treatment includes oral agent (dual therapy). She is compliant with treatment all of the time. Her weight is increasing steadily (She has history of body weight up to 276 pounds followed by progressive weight loss   to 224 pounds last year, more recently she has gained 10 pounds.  ). She has had a previous visit with a dietitian. She participates in exercise intermittently. (Her most recent A1c was 6.7% staying stable.    ) An ACE inhibitor/angiotensin II receptor blocker is not being taken.  Hyperlipidemia  This is a chronic problem. The current episode started more than 1 year ago. The problem is controlled. Exacerbating diseases include diabetes and obesity. Pertinent negatives include no chest pain, myalgias or shortness of breath. Current antihyperlipidemic treatment includes statins. The current treatment provides significant improvement of lipids. Risk factors for coronary artery disease include dyslipidemia, diabetes mellitus, hypertension, obesity and post-menopausal.    Review of Systems  Constitutional: Negative for chills, fatigue, fever and unexpected weight change.  HENT: Negative for trouble swallowing and voice change.   Eyes: Negative for visual disturbance.  Respiratory: Negative for cough, shortness of breath and wheezing.   Cardiovascular: Negative for chest pain, palpitations and leg swelling.  Gastrointestinal: Negative for diarrhea, nausea and vomiting.  Endocrine: Negative for cold intolerance, heat intolerance, polydipsia, polyphagia and polyuria.  Musculoskeletal: Negative for arthralgias and myalgias.  Skin: Negative for color change, pallor, rash and wound.  Neurological: Negative for seizures and headaches.  Psychiatric/Behavioral: Negative for confusion and suicidal ideas.    Objective:    BP 117/81   Pulse 80   Ht 5' 6.5" (1.689 m)   Wt 235 lb (106.6 kg)   LMP 03/19/2012   BMI 37.36 kg/m   Wt Readings from Last 3 Encounters:  03/28/18 235 lb (106.6 kg)  11/08/17 224 lb (101.6 kg)  07/12/17 233 lb (105.7 kg)    Physical Exam  Constitutional: She is oriented to person, place, and time. She appears well-developed.  HENT:  Head: Normocephalic and atraumatic.   Eyes: EOM are normal.  Neck: Normal range of motion. Neck supple. No tracheal deviation present. No thyromegaly present.  Cardiovascular: Normal rate.  Pulmonary/Chest: Effort normal.  Abdominal: There is no tenderness. There is no guarding.  Musculoskeletal: Normal range of motion. She exhibits edema.  She has significant  venous stasis, on  bilateral lower extremities.  Neurological: She is alert and oriented to person, place, and time. She has normal reflexes. No cranial nerve deficit. Coordination normal.  Skin: Skin is warm and dry. No rash noted. No erythema. No pallor.  Psychiatric: She has a normal mood and affect. Judgment normal.     CMP ( most recent) CMP     Component Value Date/Time   NA 141 03/26/2018 0745   NA 143 10/10/2016 0803   K 4.1 03/26/2018 0745   CL 110 03/26/2018 0745   CO2 23 03/26/2018 0745  GLUCOSE 122 (H) 03/26/2018 0745   BUN 15 03/26/2018 0745   BUN 14 10/10/2016 0803   CREATININE 0.81 03/26/2018 0745   CALCIUM 9.4 03/26/2018 0745   PROT 6.8 03/26/2018 0745   ALBUMIN 4.5 07/09/2017 0957   AST 21 03/26/2018 0745   ALT 27 03/26/2018 0745   ALKPHOS 70 07/09/2017 0957   BILITOT 0.6 03/26/2018 0745   GFRNONAA 80 03/26/2018 0745   GFRAA 93 03/26/2018 0745     Diabetic Labs (most recent): Lab Results  Component Value Date   HGBA1C 6.7 (H) 03/26/2018   HGBA1C 6.5 (H) 11/06/2017   HGBA1C 6.4 (H) 07/09/2017     Lipid Panel ( most recent) Lipid Panel     Component Value Date/Time   CHOL 138 03/26/2018 0745   TRIG 61 03/26/2018 0745   HDL 62 03/26/2018 0745   CHOLHDL 2.2 03/26/2018 0745   VLDL 15 10/01/2013 1729   LDLCALC 62 03/26/2018 0745      Assessment & Plan:   1) Weight management:  - She has responded to lifestyle modification and medical treatment for weight management, by losing 50+ pounds overall.  More recently she has gained 10 pounds.   - Hers is is likely related to combination of positive caloric intake and slow  metabolism.   I have counseled the patient on diet management and weight loss, by adopting a carbohydrate restricted/protein rich diet.  -  Suggestion is made for her to avoid simple carbohydrates  from her diet including Cakes, Sweet Desserts / Pastries, Ice Cream, Soda (diet and regular), Sweet Tea, Candies, Chips, Cookies, Store Bought Juices, Alcohol in Excess of  1-2 drinks a day, Artificial Sweeteners, and "Sugar-free" Products. This will help patient to have stable blood glucose profile and potentially avoid unintended weight gain.  - I encouraged the patient to switch to  unprocessed or minimally processed complex starch and increased protein intake (animal or plant source), fruits, and vegetables.  - She is advised to stick to a routine mealtimes to eat 3 meals  a day and avoid unnecessary snacks . -  She has benefited from [place]. She will benefit from higher dose.   Treatment goal is loss of 3-5% by next visit. She has no contraindications for its use.  I will prescribe Qsymia 15/92 mg p.o. Daily.   2. Diabetes mellitus without complication ([place])  - She has currently controlled asymptomatic type 2 DM since  57 years of age,  with most recent A1c of  6.7%. Recent labs reviewed, showing normal renal and hepatic function.   - she remains at a high risk for acute and chronic complications of diabetes which include CAD, CVA, CKD, retinopathy, and neuropathy.  - I have approached patient with the following individualized plan to manage diabetes and patient agrees:   - She does not require insulin treatment at this time. - I will continue Janumet  50/1000 mg po BID daily, therapeutically suitable for patient. -She will be considered for weekly incretin therapy if necessary by next visit.  - Patient specific target  A1c;  LDL, HDL, Triglycerides, and  Waist Circumference were discussed in detail.  3) Hyperlipidemia: She has seen significant improvement in her lipid panel LDL of  62 lowering from 126.    She has tolerated Crestor. I will continue  Crestor 5 mg by mouth daily at bedtime to advance if tolerated and if necessary.  4) BP/HTN: Controlled, at 117/81.    She is not on any antihypertensive  therapy at this time. She will be considered for low-dose  ACEI/ARB on subsequent visits if necessary.   5) vitamin D deficiency:   Her vitamin D level is not 41. I have advised her to hold OTC vitamin D for next season.     6) Chronic Care/Health Maintenance:  -Patient is  encouraged to stay away from smoking. I have recommended   moderate intensity exercise for up to 60 minutes of walking for up to 4 days a week.   - I advised patient to maintain close follow up with Mikey Kirschner, MD for primary care needs.  Follow up plan: - Return in about 3 months (around 06/28/2018) for follow up with pre-visit labs.  Glade Lloyd, MD Phone: [phone]  Fax: [phone]  -  This note was partially dictated with voice recognition software. Similar sounding words can be transcribed inadequately or may not  be corrected upon review.  03/28/2018, 11:49 AM

## 2018-04-02 MED FILL — ROSUVASTATIN CALCIUM 5 MG T: 5 | 30 days supply | Qty: 30 | Fill #1

## 2018-04-02 MED FILL — SHIPPING COST: 1 days supply | Qty: 1 | Fill #4

## 2018-04-04 ENCOUNTER — Other Ambulatory Visit: Payer: Self-pay

## 2018-04-04 MED ORDER — SITAGLIPTIN PHOS-METFORMIN HCL 50-1000 MG PO TABS: 1.0000 | ORAL_TABLET | Freq: Two times a day (BID) | ORAL | 0 refills | Status: DC

## 2018-04-04 MED ORDER — ROSUVASTATIN CALCIUM 5 MG PO TABS: 5.0000 mg | ORAL_TABLET | Freq: Every day | ORAL | 0 refills | Status: DC

## 2018-04-04 MED FILL — JANUMET XR 50-1,000 MG TAB: 50-1000 | 60 days supply | Qty: 120 | Fill #0

## 2018-04-07 ENCOUNTER — Other Ambulatory Visit: Payer: Self-pay

## 2018-04-07 MED ORDER — SITAGLIP PHOS-METFORMIN HCL ER 50-1000 MG PO TB24: 1.0000 | ORAL_TABLET | Freq: Two times a day (BID) | ORAL | 1 refills | Status: DC

## 2018-04-11 MED FILL — SHIPPING COST: 1 days supply | Qty: 1 | Fill #5

## 2018-04-11 MED FILL — QSYMIA 15 MG-92 MG CAPSULE: 15-92 | 30 days supply | Qty: 30 | Fill #0

## 2018-05-30 DIAGNOSIS — B372 Candidiasis of skin and nail: Secondary | ICD-10-CM | POA: Diagnosis not present

## 2018-06-05 DIAGNOSIS — S90212A Contusion of left great toe with damage to nail, initial encounter: Secondary | ICD-10-CM | POA: Diagnosis not present

## 2018-06-05 DIAGNOSIS — L608 Other nail disorders: Secondary | ICD-10-CM | POA: Diagnosis not present

## 2018-06-06 MED FILL — JANUMET XR 50-1,000 MG TAB: 50-1000 | 60 days supply | Qty: 120 | Fill #1

## 2018-06-06 MED FILL — SHIPPING COST: 1 days supply | Qty: 1 | Fill #6

## 2018-06-06 MED FILL — QSYMIA 15 MG-92 MG CAPSULE: 15-92 | 30 days supply | Qty: 30 | Fill #1

## 2018-06-06 MED FILL — ROSUVASTATIN CALCIUM 5 MG T: 5 | 90 days supply | Qty: 90 | Fill #0

## 2018-06-09 ENCOUNTER — Telehealth: Payer: 59 | Admitting: Nurse Practitioner

## 2018-06-09 DIAGNOSIS — N3000 Acute cystitis without hematuria: Secondary | ICD-10-CM

## 2018-06-09 MED ORDER — CIPROFLOXACIN HCL 500 MG PO TABS: 500.0000 mg | ORAL_TABLET | Freq: Two times a day (BID) | ORAL | 0 refills | Status: DC

## 2018-06-09 NOTE — Progress Notes (Signed)

## 2018-07-04 ENCOUNTER — Ambulatory Visit: Payer: 59 | Admitting: "Endocrinology

## 2018-07-08 MED FILL — SHIPPING COST: 1 days supply | Qty: 1 | Fill #7

## 2018-07-08 MED FILL — QSYMIA 15 MG-92 MG CAPSULE: 15-92 | 30 days supply | Qty: 30 | Fill #2

## 2018-07-11 ENCOUNTER — Ambulatory Visit: Payer: 59 | Admitting: "Endocrinology

## 2018-07-25 ENCOUNTER — Ambulatory Visit: Payer: 59 | Admitting: "Endocrinology

## 2018-07-25 MED FILL — SHIPPING COST: 1 days supply | Qty: 1 | Fill #8

## 2018-07-25 MED FILL — RESTASIS 0.05% EYE EMULSION: 0.05 | 90 days supply | Qty: 180 | Fill #1

## 2018-08-01 ENCOUNTER — Ambulatory Visit: Payer: 59 | Admitting: Family Medicine

## 2018-08-01 ENCOUNTER — Encounter: Payer: Self-pay | Admitting: Family Medicine

## 2018-08-01 VITALS — BP 124/78 | Temp 97.5°F | Ht 66.5 in | Wt 230.0 lb

## 2018-08-01 DIAGNOSIS — E119 Type 2 diabetes mellitus without complications: Secondary | ICD-10-CM | POA: Diagnosis not present

## 2018-08-01 DIAGNOSIS — Z114 Encounter for screening for human immunodeficiency virus [HIV]: Secondary | ICD-10-CM | POA: Diagnosis not present

## 2018-08-01 DIAGNOSIS — Z1159 Encounter for screening for other viral diseases: Secondary | ICD-10-CM

## 2018-08-01 DIAGNOSIS — Z79899 Other long term (current) drug therapy: Secondary | ICD-10-CM

## 2018-08-01 MED ORDER — MUPIROCIN 2 % EX OINT: TOPICAL_OINTMENT | CUTANEOUS | 2 refills | Status: DC

## 2018-08-01 MED ORDER — CEPHALEXIN 500 MG PO CAPS: 500.0000 mg | ORAL_CAPSULE | Freq: Four times a day (QID) | ORAL | 0 refills | Status: DC

## 2018-08-01 NOTE — Addendum Note (Signed)
Addended by: Sallee Lange A on: 08/01/2018 10:05 PM   Modules accepted: Level of Service

## 2018-08-01 NOTE — Progress Notes (Addendum)
   Subjective:    Patient ID: Laura Valencia, female    DOB: 17-Aug-1961, 57 y.o.   MRN: [id number]  HPIinfected left great toe. Just finished 2 weeks of keflex from Dr. Nevada Crane.   Would like bloodwork for hiv, a1c, urine microalb, and kidney function.  Was seeing dr nida would like to start back seeing dr Richardson Landry for diabetes.   Patient relates that she was treated with Keflex that seem to help she also relates some pain and discomfort but it seems to be getting better denies high fever chills or sweats states her overall energy level okay  Patient is trying to get her diabetes under good control  Review of Systems  Constitutional: Negative for activity change, appetite change and fatigue.  HENT: Negative for congestion and rhinorrhea.   Respiratory: Negative for cough and shortness of breath.   Cardiovascular: Negative for chest pain and leg swelling.  Gastrointestinal: Negative for abdominal pain and diarrhea.  Endocrine: Negative for polydipsia and polyphagia.  Skin: Negative for color change.  Neurological: Negative for dizziness and weakness.  Psychiatric/Behavioral: Negative for behavioral problems and confusion.       Objective:   Physical Exam  Her knee is normal calf is normal no sign of DVT ankle is normal foot is normal pulses are faint but present Patient does have what appears to be a healing infection of her toe would benefit from 1 more round of antibiotics and Bactroban  15 minutes was spent with patient today discussing healthcare issues which they came.  More than 50% of this visit-total duration of visit-was spent in counseling and coordination of care.  Please see diagnosis regarding the focus of this coordination and care     Assessment & Plan:  Lab work for diabetes ordered Follow-up with Dr. Richardson Landry  Patient states her diabetes is under good control takes her medicine on a regular basis denies any sugar issues  Toe infection Bactroban ointment along with  Keflex Warning signs were discussed because patient is diabetic

## 2018-08-02 LAB — HIV ANTIBODY (ROUTINE TESTING W REFLEX): HIV Screen 4th Generation wRfx: NONREACTIVE

## 2018-08-02 LAB — HEMOGLOBIN A1C
ESTIMATED AVERAGE GLUCOSE: 140 mg/dL
HEMOGLOBIN A1C: 6.5 % — AB (ref 4.8–5.6)

## 2018-08-02 LAB — BASIC METABOLIC PANEL
BUN/Creatinine Ratio: 20 (ref 9–23)
BUN: 16 mg/dL (ref 6–24)
CALCIUM: 9.9 mg/dL (ref 8.7–10.2)
CHLORIDE: 105 mmol/L (ref 96–106)
CO2: 22 mmol/L (ref 20–29)
CREATININE: 0.82 mg/dL (ref 0.57–1.00)
GFR calc Af Amer: 91 mL/min/{1.73_m2} (ref >59)
GFR calc non Af Amer: 79 mL/min/{1.73_m2} (ref >59)
GLUCOSE: 95 mg/dL (ref 65–99)
Potassium: 4.1 mmol/L (ref 3.5–5.2)
Sodium: 142 mmol/L (ref 134–144)

## 2018-08-02 LAB — HEPATITIS C ANTIBODY

## 2018-08-02 LAB — MICROALBUMIN / CREATININE URINE RATIO
CREATININE, UR: 123.5 mg/dL
MICROALB/CREAT RATIO: 5.6 mg/g{creat} (ref 0.0–30.0)
MICROALBUM., U, RANDOM: 6.9 ug/mL

## 2018-08-04 ENCOUNTER — Encounter: Payer: Self-pay | Admitting: Family Medicine

## 2018-09-04 MED FILL — JANUMET XR 50-1,000 MG TAB: 50-1000 | 60 days supply | Qty: 120 | Fill #2

## 2018-09-04 MED FILL — SHIPPING COST: 1 days supply | Qty: 1 | Fill #9

## 2018-10-03 DIAGNOSIS — G4733 Obstructive sleep apnea (adult) (pediatric): Secondary | ICD-10-CM | POA: Diagnosis not present

## 2018-10-03 DIAGNOSIS — E1169 Type 2 diabetes mellitus with other specified complication: Secondary | ICD-10-CM | POA: Diagnosis not present

## 2018-10-03 DIAGNOSIS — J324 Chronic pansinusitis: Secondary | ICD-10-CM | POA: Diagnosis not present

## 2018-10-03 DIAGNOSIS — E669 Obesity, unspecified: Secondary | ICD-10-CM | POA: Diagnosis not present

## 2018-10-14 ENCOUNTER — Other Ambulatory Visit: Payer: Self-pay | Admitting: Family Medicine

## 2018-10-14 DIAGNOSIS — Z1231 Encounter for screening mammogram for malignant neoplasm of breast: Secondary | ICD-10-CM

## 2018-11-18 MED FILL — RESTASIS 0.05% EYE EMULSION: 0.05 | 90 days supply | Qty: 180 | Fill #2

## 2018-11-18 MED FILL — SHIPPING COST: 1 days supply | Qty: 1 | Fill #10

## 2018-11-21 ENCOUNTER — Ambulatory Visit: Payer: 59 | Admitting: Family Medicine

## 2018-11-21 ENCOUNTER — Ambulatory Visit: Payer: 59

## 2018-11-21 ENCOUNTER — Encounter: Payer: Self-pay | Admitting: Family Medicine

## 2018-11-21 VITALS — BP 110/72 | Ht 66.5 in | Wt 236.0 lb

## 2018-11-21 DIAGNOSIS — Z79899 Other long term (current) drug therapy: Secondary | ICD-10-CM | POA: Diagnosis not present

## 2018-11-21 DIAGNOSIS — G4714 Hypersomnia due to medical condition: Secondary | ICD-10-CM | POA: Diagnosis not present

## 2018-11-21 DIAGNOSIS — G4733 Obstructive sleep apnea (adult) (pediatric): Secondary | ICD-10-CM | POA: Diagnosis not present

## 2018-11-21 DIAGNOSIS — E785 Hyperlipidemia, unspecified: Secondary | ICD-10-CM | POA: Diagnosis not present

## 2018-11-21 DIAGNOSIS — E119 Type 2 diabetes mellitus without complications: Secondary | ICD-10-CM

## 2018-11-21 MED ORDER — SITAGLIP PHOS-METFORMIN HCL ER 50-1000 MG PO TB24: 1.0000 | ORAL_TABLET | Freq: Two times a day (BID) | ORAL | 1 refills | Status: DC

## 2018-11-21 MED ORDER — FLUTICASONE PROPIONATE 50 MCG/ACT NA SUSP: 1.0000 | Freq: Every day | NASAL | 1 refills | Status: DC

## 2018-11-21 MED ORDER — ROSUVASTATIN CALCIUM 5 MG PO TABS: 5.0000 mg | ORAL_TABLET | Freq: Every day | ORAL | 1 refills | Status: DC

## 2018-11-21 NOTE — Progress Notes (Signed)
   Subjective:    Patient ID: Laura Valencia, female    DOB: [birth year], 59 y.o.   MRN: [id number]  Diabetes  She presents for her follow-up diabetic visit. She has type 2 diabetes mellitus. Compliance with diabetes treatment: has not been taking crestor everyday but will start. Home blood sugar record trend: does not test blood sugar. Eye exam is current (early this year).   Pt states no concerns. Wants A1C done on bloodwork.   Patient continues to take lipid medication regularly. No obvious side effects from it. Generally does not miss a dose. Prior blood work results are reviewed with patient. Patient continues to work on fat intake in diet  Patient claims compliance with diabetes medication. No obvious side effects. Reports no substantial low sugar spells. Most numbers are generally in good range when checked fasting. Generally does not miss a dose of medication. Watching diabetic diet closely  Sleep apnea/patient currently not treating the CPAP left.  Due to get back with neurologist soon in this regard.   Under   Bicycles thirty min three days per week     Review of Systems No headache, no major weight loss or weight gain, no chest pain no back pain abdominal pain no change in bowel habits complete ROS otherwise negative     Objective:   Physical Exam Alert and oriented, vitals reviewed and stable, NAD ENT-TM's and ext canals WNL bilat via otoscopic exam Soft palate, tonsils and post pharynx WNL via oropharyngeal exam Neck-symmetric, no masses; thyroid nonpalpable and nontender Pulmonary-no tachypnea or accessory muscle use; Clear without wheezes via auscultation Card--no abnrml murmurs, rhythm reg and rate WNL Carotid pulses symmetric, without bruits        Assessment & Plan:  Impression type 2 diabetes.  Current control uncertain.  Prior A1c discussed.  Maintain same meds pending salt  2.  Hyperlipidemia.  Discussed.  Patient has been off Crestor.  Will resume.   Patient to stick with meds appropriate blood work  3.  Morbid obesity.  Patient recognizes her diet is a big source of her concerns.  She states will definitely work on it  4.  Stress.  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  Venous stasis discussed briefly  Follow-up in 6 months for wellness plus chronic

## 2018-12-01 ENCOUNTER — Ambulatory Visit (HOSPITAL_COMMUNITY)
Admission: RE | Admit: 2018-12-01 | Discharge: 2018-12-01 | Disposition: A | Discharge status: Home / Self Care | Payer: 59 | Source: Ambulatory Visit | Attending: Family Medicine | Admitting: Family Medicine

## 2018-12-01 DIAGNOSIS — Z1231 Encounter for screening mammogram for malignant neoplasm of breast: Secondary | ICD-10-CM | POA: Insufficient documentation

## 2018-12-01 MED FILL — JANUMET XR 50-1,000 MG TAB: 50-1000 | 60 days supply | Qty: 120 | Fill #0

## 2018-12-01 MED FILL — ROSUVASTATIN CALCIUM 5 MG T: 5 | 90 days supply | Qty: 90 | Fill #0

## 2018-12-01 MED FILL — FLUTICASONE PROP 50 MCG SPR: 50 | 60 days supply | Qty: 16 | Fill #0

## 2018-12-05 ENCOUNTER — Ambulatory Visit: Payer: 59 | Admitting: Family Medicine

## 2018-12-21 DIAGNOSIS — G4714 Hypersomnia due to medical condition: Secondary | ICD-10-CM | POA: Diagnosis not present

## 2018-12-21 DIAGNOSIS — G4733 Obstructive sleep apnea (adult) (pediatric): Secondary | ICD-10-CM | POA: Diagnosis not present

## 2019-01-02 ENCOUNTER — Ambulatory Visit: Payer: 59

## 2019-01-03 DIAGNOSIS — Z79899 Other long term (current) drug therapy: Secondary | ICD-10-CM | POA: Diagnosis not present

## 2019-01-03 DIAGNOSIS — E119 Type 2 diabetes mellitus without complications: Secondary | ICD-10-CM | POA: Diagnosis not present

## 2019-01-03 DIAGNOSIS — E785 Hyperlipidemia, unspecified: Secondary | ICD-10-CM | POA: Diagnosis not present

## 2019-01-05 ENCOUNTER — Encounter: Payer: Self-pay | Admitting: Family Medicine

## 2019-01-05 LAB — HEPATIC FUNCTION PANEL
ALT: 39 IU/L — ABNORMAL HIGH (ref 0–32)
AST: 27 IU/L (ref 0–40)
Albumin: 4.6 g/dL (ref 3.5–5.5)
Alkaline Phosphatase: 75 IU/L (ref 39–117)
Bilirubin Total: 0.7 mg/dL (ref 0.0–1.2)
Bilirubin, Direct: 0.2 mg/dL (ref 0.00–0.40)
Total Protein: 7.2 g/dL (ref 6.0–8.5)

## 2019-01-05 LAB — HEMOGLOBIN A1C
Est. average glucose Bld gHb Est-mCnc: 157 mg/dL
Hgb A1c MFr Bld: 7.1 % — ABNORMAL HIGH (ref 4.8–5.6)

## 2019-01-05 LAB — LIPID PANEL
Chol/HDL Ratio: 2.1 ratio (ref 0.0–4.4)
Cholesterol, Total: 143 mg/dL (ref 100–199)
HDL: 68 mg/dL (ref >39)
LDL Calculated: 64 mg/dL (ref 0–99)
Triglycerides: 54 mg/dL (ref 0–149)
VLDL Cholesterol Cal: 11 mg/dL (ref 5–40)

## 2019-01-09 DIAGNOSIS — E1169 Type 2 diabetes mellitus with other specified complication: Secondary | ICD-10-CM | POA: Diagnosis not present

## 2019-01-09 DIAGNOSIS — G4733 Obstructive sleep apnea (adult) (pediatric): Secondary | ICD-10-CM | POA: Diagnosis not present

## 2019-01-09 DIAGNOSIS — E669 Obesity, unspecified: Secondary | ICD-10-CM | POA: Diagnosis not present

## 2019-01-09 DIAGNOSIS — J324 Chronic pansinusitis: Secondary | ICD-10-CM | POA: Diagnosis not present

## 2019-01-21 DIAGNOSIS — G4733 Obstructive sleep apnea (adult) (pediatric): Secondary | ICD-10-CM | POA: Diagnosis not present

## 2019-01-21 DIAGNOSIS — G4714 Hypersomnia due to medical condition: Secondary | ICD-10-CM | POA: Diagnosis not present

## 2019-01-26 DIAGNOSIS — G4733 Obstructive sleep apnea (adult) (pediatric): Secondary | ICD-10-CM | POA: Diagnosis not present

## 2019-02-21 DIAGNOSIS — G4733 Obstructive sleep apnea (adult) (pediatric): Secondary | ICD-10-CM | POA: Diagnosis not present

## 2019-02-21 DIAGNOSIS — G4714 Hypersomnia due to medical condition: Secondary | ICD-10-CM | POA: Diagnosis not present

## 2019-03-05 ENCOUNTER — Ambulatory Visit (INDEPENDENT_AMBULATORY_CARE_PROVIDER_SITE_OTHER): Payer: 59

## 2019-03-05 ENCOUNTER — Encounter: Payer: Self-pay | Admitting: Orthopaedic Surgery

## 2019-03-05 ENCOUNTER — Ambulatory Visit: Payer: 59 | Admitting: Orthopaedic Surgery

## 2019-03-05 VITALS — BP 121/80 | HR 69 | Ht 66.0 in | Wt 245.0 lb

## 2019-03-05 DIAGNOSIS — M25561 Pain in right knee: Secondary | ICD-10-CM

## 2019-03-05 DIAGNOSIS — M1711 Unilateral primary osteoarthritis, right knee: Secondary | ICD-10-CM | POA: Diagnosis not present

## 2019-03-05 MED FILL — JANUMET XR 50-1,000 MG TAB: 50-1000 | 60 days supply | Qty: 120 | Fill #1 | Status: TO

## 2019-03-05 NOTE — Progress Notes (Signed)
Subjective:    Patient ID: Laura Valencia, female    DOB: [birth year], 58 y.o.   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 She was told about 8 to 10 years ago she might need a total knee.  She has popping and pain of the knee with some swelling at times. It got more acute in the last few days with more crepitus, feeling of giving way and almost locking.  She has had difficulty walking.  She has no direct trauma.  She has taken ibuprofen, used ice and tried to limit activity but is still seeing patients.    She has no redness, no numbness.   Review of Systems  Constitutional: Positive for activity change.  Musculoskeletal: Positive for arthralgias, gait problem and joint swelling.  All other systems reviewed and are negative.  For Review of Systems, all other systems reviewed and are negative.  The following is a summary of the past history medically, past history surgically, known current medicines, social history and family history.  This information is gathered electronically by the computer from prior information and documentation.  I review this each visit and have found including this information at this point in the chart is beneficial and informative.   Past Medical History:  Diagnosis Date  . Arthritis    knees  . Colon polyp    TYPE UNKNOWN  . Diabetes mellitus, type 2 ([place])   . Hyperthyroidism    HX; RX IN THE PAST  . OSA (obstructive sleep apnea)    NPSH 06-19-2010 AHI 9.3/HR  . Seasonal rhinitis    MILD  . Sleep apnea    occasional uses CPAP    Past Surgical History:  Procedure Laterality Date  . BREAST LUMPECTOMY     LEFT -BENIGN  . CARPAL TUNNEL RELEASE     BILATERAL  . COLONOSCOPY  11/2010   stark poylps  . TRIGGER FINGER RELEASE Left    middle finger    Current Outpatient Medications on File Prior to Visit  Medication Sig Dispense Refill  . aspirin 81 MG tablet Take 81 mg by mouth daily.    . fluticasone (FLONASE)  50 MCG/ACT nasal spray Place 1 spray into both nostrils daily. 16 g 1  . rosuvastatin (CRESTOR) 5 MG tablet Take 1 tablet (5 mg total) by mouth daily. 90 tablet 1  . SitaGLIPtin-MetFORMIN HCl 50-1000 MG TB24 Take 1 tablet by mouth 2 (two) times daily. 180 tablet 1  . mupirocin ointment (BACTROBAN) 2 % Apply to affected area 2 times daily (Patient not taking: Reported on 03/05/2019) 15 g 2   No current facility-administered medications on file prior to visit.     Social History   Socioeconomic History  . Marital status: Married    Spouse name: Not on file  . Number of children: Not on file  . Years of education: Not on file  . Highest education level: Not on file  Occupational History  . Occupation: MD    Employer: 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 Financial resource strain: Not on file  . Food insecurity:    Worry: Not on file    Inability: Not on file  . Transportation needs:    Medical: Not on file    Non-medical: Not on file  Tobacco Use  . Smoking status: Never Smoker  . Smokeless tobacco: Never Used  Substance and Sexual Activity  . Alcohol  use: Yes    Comment: RARE USE OF ETOH  . Drug use: No  . Sexual activity: Not on file  Lifestyle  . Physical activity:    Days per week: Not on file    Minutes per session: Not on file  . Stress: Not on file  Relationships  . Social connections:    Talks on phone: Not on file    Gets together: Not on file    Attends religious service: Not on file    Active member of club or organization: Not on file    Attends meetings of clubs or organizations: Not on file    Relationship status: Not on file  . Intimate partner violence:    Fear of current or ex partner: Not on file    Emotionally abused: Not on file    Physically abused: Not on file    Forced sexual activity: Not on file  Other Topics Concern  . Not on file  Social History Narrative   ORIGINALLY [place].    Family History    Problem Relation Age of Onset  . Colon cancer Mother 38  . Esophageal cancer Neg Hx   . Stomach cancer Neg Hx   . Rectal cancer Neg Hx     BP 121/80   Pulse 69   Ht 5\' 6"  (1.676 m)   Wt 245 lb (111.1 kg)   LMP 03/19/2012   BMI 39.54 kg/m   Body mass index is 39.54 kg/m.     Objective:   Physical Exam Constitutional:      Appearance: She is well-developed.  HENT:     Head: Normocephalic and atraumatic.  Eyes:     Conjunctiva/sclera: Conjunctivae normal.     Pupils: Pupils are equal, round, and reactive to light.  Neck:     Musculoskeletal: Normal range of motion and neck supple.  Cardiovascular:     Rate and Rhythm: Normal rate and regular rhythm.  Pulmonary:     Effort: Pulmonary effort is normal.  Abdominal:     Palpations: Abdomen is soft.  Musculoskeletal:     Right knee: She exhibits swelling and effusion. Tenderness found. Medial joint line tenderness noted.       Legs:  Skin:    General: Skin is warm and dry.  Neurological:     Mental Status: She is alert and oriented to person, place, and time.     Cranial Nerves: No cranial nerve deficit.     Motor: No abnormal muscle tone.     Coordination: Coordination normal.     Deep Tendon Reflexes: Reflexes are normal and symmetric. Reflexes normal.  Psychiatric:        Behavior: Behavior normal.        Thought Content: Thought content normal.        Judgment: Judgment normal.       X-rays were done of the right knee, reported separately. Moderate to severe degenerative joint disease is present with bone on bone medially.    Assessment & Plan:   Encounter Diagnoses  Name Primary?  . Acute pain of right knee Yes  . Primary osteoarthritis of right knee    PROCEDURE NOTE:  The patient requests injections of the right knee , verbal consent was obtained.  The right knee was prepped appropriately after time out was performed.   Sterile technique was observed and injection of 1 cc of Depo-Medrol 40 mg  with several cc's of plain xylocaine. Anesthesia was  provided by ethyl chloride and a 20-gauge needle was used to inject the knee area. The injection was tolerated well.  A band aid dressing was applied.  The patient was advised to apply ice later today and tomorrow to the injection sight as needed.  I have shown her the x-rays and the bone on bone medial apposition.  She has significant DJD.  I have discussed possible visco supplementation.  I have discussed possible MRI of the right knee.  She brought up that she was told she would need a total knee arthroplasty in the future.  She wants to delay any surgery as long as she can.  She is to continue the ibuprofen.  Return to see me as needed.  Call if any problem.  Precautions discussed.   Electronically Signed Sanjuana Kava, MD 3/5/202011:32 AM

## 2019-03-10 ENCOUNTER — Telehealth: Payer: Self-pay | Admitting: Orthopaedic Surgery

## 2019-03-10 NOTE — Telephone Encounter (Signed)
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  She wanted to thank you and the staff for getting her in last week.

## 2019-03-22 DIAGNOSIS — G4733 Obstructive sleep apnea (adult) (pediatric): Secondary | ICD-10-CM | POA: Diagnosis not present

## 2019-03-22 DIAGNOSIS — G4714 Hypersomnia due to medical condition: Secondary | ICD-10-CM | POA: Diagnosis not present

## 2019-03-25 ENCOUNTER — Other Ambulatory Visit: Payer: Self-pay | Admitting: Family Medicine

## 2019-03-25 DIAGNOSIS — Z1231 Encounter for screening mammogram for malignant neoplasm of breast: Secondary | ICD-10-CM

## 2019-04-01 DIAGNOSIS — G4733 Obstructive sleep apnea (adult) (pediatric): Secondary | ICD-10-CM | POA: Diagnosis not present

## 2019-04-08 ENCOUNTER — Other Ambulatory Visit: Payer: Self-pay | Admitting: Radiology

## 2019-04-08 MED ORDER — DICLOFENAC SODIUM 2 % TD SOLN: 2.0000 | Freq: Two times a day (BID) | TRANSDERMAL | 3 refills | Status: DC

## 2019-04-08 NOTE — Telephone Encounter (Signed)
Patient calls asking for Pennsaid Rx to be sent in. Ok per Dr Luna Glasgow.  I sent to [facility] for processing, they will mail to her.

## 2019-04-13 MED FILL — ROSUVASTATIN CALCIUM 5 MG T: 5 | 90 days supply | Qty: 90 | Fill #0

## 2019-04-13 MED FILL — RESTASIS 0.05% EYE EMULSION: 0.05 | 90 days supply | Qty: 180 | Fill #0

## 2019-04-14 ENCOUNTER — Ambulatory Visit (INDEPENDENT_AMBULATORY_CARE_PROVIDER_SITE_OTHER): Payer: 59 | Admitting: Family Medicine

## 2019-04-14 ENCOUNTER — Encounter: Payer: Self-pay | Admitting: Family Medicine

## 2019-04-14 ENCOUNTER — Other Ambulatory Visit: Payer: Self-pay

## 2019-04-14 ENCOUNTER — Ambulatory Visit (HOSPITAL_COMMUNITY)
Admission: RE | Admit: 2019-04-14 | Discharge: 2019-04-14 | Disposition: A | Discharge status: Home / Self Care | Payer: 59 | Source: Ambulatory Visit | Attending: Family Medicine | Admitting: Family Medicine

## 2019-04-14 ENCOUNTER — Other Ambulatory Visit (HOSPITAL_COMMUNITY)
Admission: RE | Admit: 2019-04-14 | Discharge: 2019-04-14 | Disposition: A | Discharge status: Home / Self Care | Payer: 59 | Source: Ambulatory Visit | Attending: Family Medicine | Admitting: Family Medicine

## 2019-04-14 DIAGNOSIS — M7989 Other specified soft tissue disorders: Secondary | ICD-10-CM | POA: Insufficient documentation

## 2019-04-14 DIAGNOSIS — M660 Rupture of popliteal cyst: Secondary | ICD-10-CM

## 2019-04-14 DIAGNOSIS — M79604 Pain in right leg: Secondary | ICD-10-CM

## 2019-04-14 DIAGNOSIS — R600 Localized edema: Secondary | ICD-10-CM | POA: Diagnosis not present

## 2019-04-14 LAB — D-DIMER, QUANTITATIVE (NOT AT ARMC): D-Dimer, Quant: 0.35 ug/mL-FEU (ref 0.00–0.50)

## 2019-04-14 NOTE — Progress Notes (Signed)
   Subjective:  Audio plus visual  Patient ID: Laura Valencia, female    DOB: 17-Feb-1961, 58 y.o.   MRN: [id number] Audio plus visual HPI right leg swelling and pain.  Received injection for knee 2 weeks ago. Swelling started over weekend. Tried compression hose.   Virtual Visit via Telephone Note  I connected with Laura Valencia on 04/14/19 at  1:10 PM EDT by telephone and verified that I am speaking with the correct person using two identifiers.   I discussed the limitations, risks, security and privacy concerns of performing an evaluation and management service by telephone and the availability of in person appointments. I also discussed with the patient that there may be a patient responsible charge related to this service. The patient expressed understanding and agreed to proceed.   History of Present Illness:    Observations/Objective:   Assessment and Plan:   Follow Up Instructions:    I discussed the assessment and treatment plan with the patient. The patient was provided an opportunity to ask questions and all were answered. The patient agreed with the plan and demonstrated an understanding of the instructions.   The patient was advised to call back or seek an in-person evaluation if the symptoms worsen or if the condition fails to improve as anticipated.  I provided 25 minutes of non-face-to-face time during this encounter.  Patient does have known substantial osteoarthritis in that knee.  In fact received a knee injection 2 weeks ago.  Also has known history of venous stasis bilateral.  Has compression stockings though does not always wear.  Has noted substantial swelling and discomfort in the calf lately in recent days.  No personal history of DVTs in the past    Review of Systems No headache, no major weight loss or weight gain, no chest pain no back pain abdominal pain no change in bowel habits complete ROS otherwise negative     Objective:   Physical  Exam   Virtual visit     Assessment & Plan:  Impression enlarged tender calf with some risk factors for DVT.  Discussion held.  We will press on and do d-dimer and ultrasound stat.  D-dimer returned normal.  Ultrasound revealed thankfully no DVT.  However there was a linear pocket of fluid consistent with a ruptured Baker's cyst.  Patient advised of this via my chart after phone call failed.

## 2019-04-14 NOTE — Progress Notes (Signed)
   Subjective:    Patient ID: Laura Valencia, female    DOB: [birth year], 58 y.o.   MRN: [id number]  HPI    Review of Systems     Objective:   Physical Exam        Assessment & Plan:

## 2019-04-17 ENCOUNTER — Telehealth: Payer: 59 | Admitting: Nurse Practitioner

## 2019-04-17 DIAGNOSIS — N3000 Acute cystitis without hematuria: Secondary | ICD-10-CM

## 2019-04-17 MED ORDER — CEPHALEXIN 500 MG PO CAPS: 500.0000 mg | ORAL_CAPSULE | Freq: Two times a day (BID) | ORAL | 0 refills | Status: DC

## 2019-04-17 NOTE — Progress Notes (Signed)

## 2019-04-22 DIAGNOSIS — G4714 Hypersomnia due to medical condition: Secondary | ICD-10-CM | POA: Diagnosis not present

## 2019-04-22 DIAGNOSIS — G4733 Obstructive sleep apnea (adult) (pediatric): Secondary | ICD-10-CM | POA: Diagnosis not present

## 2019-05-01 ENCOUNTER — Other Ambulatory Visit: Payer: Self-pay | Admitting: Orthopedic Surgery

## 2019-05-01 DIAGNOSIS — M25562 Pain in left knee: Secondary | ICD-10-CM

## 2019-05-01 DIAGNOSIS — M25561 Pain in right knee: Secondary | ICD-10-CM

## 2019-05-08 DIAGNOSIS — H5203 Hypermetropia, bilateral: Secondary | ICD-10-CM | POA: Diagnosis not present

## 2019-05-08 LAB — HM DIABETES EYE EXAM

## 2019-05-14 ENCOUNTER — Telehealth: Payer: Self-pay | Admitting: Family Medicine

## 2019-05-14 NOTE — Telephone Encounter (Signed)
Patient put in my chart message for appointments for lab review for diabetes/cholestrol management from 5/18-/5/29. She states fridays are better for her. Possibly needing labs done for this visit. Also she wants to schedule her annual physical for July on any Friday also. Please advise

## 2019-05-15 ENCOUNTER — Other Ambulatory Visit: Payer: Self-pay

## 2019-05-15 ENCOUNTER — Other Ambulatory Visit: Payer: Self-pay | Admitting: *Deleted

## 2019-05-15 ENCOUNTER — Ambulatory Visit
Admission: RE | Admit: 2019-05-15 | Discharge: 2019-05-15 | Disposition: A | Discharge status: Home / Self Care | Payer: 59 | Source: Ambulatory Visit | Attending: Orthopedic Surgery | Admitting: Orthopedic Surgery

## 2019-05-15 DIAGNOSIS — M25561 Pain in right knee: Secondary | ICD-10-CM

## 2019-05-15 MED ORDER — SITAGLIP PHOS-METFORMIN HCL ER 50-1000 MG PO TB24: 1.0000 | ORAL_TABLET | Freq: Two times a day (BID) | ORAL | 0 refills | Status: DC

## 2019-05-15 MED FILL — JANUMET XR 50-1,000 MG TAB: 50-1000 | 60 days supply | Qty: 120 | Fill #0

## 2019-05-15 NOTE — Telephone Encounter (Signed)
Can give may 22 appt.no labs nec other than A1c here, do not know July schedule yet as far as fridays

## 2019-05-18 NOTE — Telephone Encounter (Signed)
Pt is schedule 5/22

## 2019-05-22 ENCOUNTER — Encounter: Payer: 59 | Admitting: Family Medicine

## 2019-05-22 DIAGNOSIS — G4714 Hypersomnia due to medical condition: Secondary | ICD-10-CM | POA: Diagnosis not present

## 2019-05-22 DIAGNOSIS — G4733 Obstructive sleep apnea (adult) (pediatric): Secondary | ICD-10-CM | POA: Diagnosis not present

## 2019-05-22 DIAGNOSIS — M170 Bilateral primary osteoarthritis of knee: Secondary | ICD-10-CM | POA: Diagnosis not present

## 2019-06-05 ENCOUNTER — Ambulatory Visit (INDEPENDENT_AMBULATORY_CARE_PROVIDER_SITE_OTHER): Payer: 59 | Admitting: Family Medicine

## 2019-06-05 ENCOUNTER — Other Ambulatory Visit: Payer: Self-pay

## 2019-06-05 VITALS — BP 128/76 | Temp 98.0°F | Ht 67.0 in | Wt 250.6 lb

## 2019-06-05 DIAGNOSIS — E785 Hyperlipidemia, unspecified: Secondary | ICD-10-CM

## 2019-06-05 DIAGNOSIS — E119 Type 2 diabetes mellitus without complications: Secondary | ICD-10-CM

## 2019-06-05 DIAGNOSIS — Z0000 Encounter for general adult medical examination without abnormal findings: Secondary | ICD-10-CM

## 2019-06-05 NOTE — Progress Notes (Signed)
Subjective:  Dragon busted, abrevi8ated documentation  Patient ID: Laura Valencia, female    DOB: 1961/08/23, 58 y.o.   MRN: [id number]  HPI The patient comes in today for a wellness visit.    A review of their health history was completed.  A review of medications was also completed.  Any needed refills; up date all meds  Eating habits: health conscious  Falls/  MVA accidents in past few months: none  Regular exercise: 4 days a week on stationary bike  Specialist pt sees on regular basis: ortho for knees and well loss clinic  Preventative health issues were discussed.   Additional concerns: none  Glu oversll doing well   needs to work on morbid   obesty   Doing  May  8 2029n  Has not done  Gyn side up to snuff  Up to dat e    Patient claims compliance with diabetes medication. No obvious side effects. Reports no substantial low sugar spells. Most numbers are generally in good range when checked fasting. Generally does not miss a dose of medication. Watching diabetic diet closely  Patient continues to take lipid medication regularly. No obvious side effects from it. Generally does not miss a dose. Prior blood work results are reviewed with patient. Patient continues to work on fat intake in diet  Results for orders placed or performed in visit on 05/13/19  HM DIABETES EYE EXAM  Result Value Ref Range   HM Diabetic Eye Exam No Retinopathy No Retinopathy      Review of Systems  Constitutional: Negative for activity change, appetite change and fatigue.  HENT: Negative for congestion and rhinorrhea.   Eyes: Negative for discharge.  Respiratory: Negative for cough, chest tightness and wheezing.   Cardiovascular: Negative for chest pain.  Gastrointestinal: Negative for abdominal pain, blood in stool and vomiting.  Endocrine: Negative for polyphagia.  Genitourinary: Negative for difficulty urinating and frequency.  Musculoskeletal: Negative for neck pain.   Skin: Negative for color change.  Allergic/Immunologic: Negative for environmental allergies and food allergies.  Neurological: Negative for weakness and headaches.  Psychiatric/Behavioral: Negative for agitation and behavioral problems.  All other systems reviewed and are negative.      Objective:   Physical Exam Vitals signs reviewed.  Constitutional:      Appearance: She is well-developed.  HENT:     Head: Normocephalic.     Right Ear: External ear normal.     Left Ear: External ear normal.  Eyes:     Pupils: Pupils are equal, round, and reactive to light.  Neck:     Musculoskeletal: Normal range of motion.     Thyroid: No thyromegaly.  Cardiovascular:     Rate and Rhythm: Normal rate and regular rhythm.     Heart sounds: Normal heart sounds. No murmur.  Pulmonary:     Effort: Pulmonary effort is normal. No respiratory distress.     Breath sounds: Normal breath sounds. No wheezing.  Abdominal:     General: Bowel sounds are normal. There is no distension.     Palpations: Abdomen is soft. There is no mass.     Tenderness: There is no abdominal tenderness.  Musculoskeletal: Normal range of motion.        General: No tenderness.  Lymphadenopathy:     Cervical: No cervical adenopathy.  Skin:    General: Skin is warm and dry.  Neurological:     Mental Status: She is alert and oriented to person, place, and  time.     Motor: No abnormal muscle tone.  Psychiatric:        Behavior: Behavior normal.           Assessment & Plan:  Imp wellnes, diet , ex disc, pt declines all vaccines, utd colon  2 type 2 diab good control, b w doisc  3 hyperlip compliance, meds, diet disc  21morbid obesity pt starting weight loss clinic, if no improve over next yr will consider bariatric ref disc

## 2019-06-08 ENCOUNTER — Ambulatory Visit (INDEPENDENT_AMBULATORY_CARE_PROVIDER_SITE_OTHER): Payer: 59 | Admitting: Bariatrics

## 2019-06-22 ENCOUNTER — Other Ambulatory Visit: Payer: Self-pay

## 2019-06-22 ENCOUNTER — Ambulatory Visit (INDEPENDENT_AMBULATORY_CARE_PROVIDER_SITE_OTHER): Payer: 59 | Admitting: Bariatrics

## 2019-06-22 ENCOUNTER — Encounter (INDEPENDENT_AMBULATORY_CARE_PROVIDER_SITE_OTHER): Payer: Self-pay | Admitting: Bariatrics

## 2019-06-22 VITALS — BP 112/75 | HR 73 | Temp 98.0°F | Ht 66.0 in | Wt 250.0 lb

## 2019-06-22 DIAGNOSIS — R0602 Shortness of breath: Secondary | ICD-10-CM | POA: Diagnosis not present

## 2019-06-22 DIAGNOSIS — Z6841 Body Mass Index (BMI) 40.0 and over, adult: Secondary | ICD-10-CM

## 2019-06-22 DIAGNOSIS — F3289 Other specified depressive episodes: Secondary | ICD-10-CM | POA: Diagnosis not present

## 2019-06-22 DIAGNOSIS — E119 Type 2 diabetes mellitus without complications: Secondary | ICD-10-CM | POA: Diagnosis not present

## 2019-06-22 DIAGNOSIS — G4714 Hypersomnia due to medical condition: Secondary | ICD-10-CM | POA: Diagnosis not present

## 2019-06-22 DIAGNOSIS — R5383 Other fatigue: Secondary | ICD-10-CM | POA: Diagnosis not present

## 2019-06-22 DIAGNOSIS — E559 Vitamin D deficiency, unspecified: Secondary | ICD-10-CM | POA: Diagnosis not present

## 2019-06-22 DIAGNOSIS — G4710 Hypersomnia, unspecified: Secondary | ICD-10-CM | POA: Diagnosis not present

## 2019-06-22 DIAGNOSIS — G4733 Obstructive sleep apnea (adult) (pediatric): Secondary | ICD-10-CM | POA: Diagnosis not present

## 2019-06-22 DIAGNOSIS — E7849 Other hyperlipidemia: Secondary | ICD-10-CM

## 2019-06-22 DIAGNOSIS — Z9189 Other specified personal risk factors, not elsewhere classified: Secondary | ICD-10-CM

## 2019-06-22 DIAGNOSIS — G4730 Sleep apnea, unspecified: Secondary | ICD-10-CM

## 2019-06-22 MED ORDER — BLOOD GLUCOSE METER KIT: PACK | 0 refills | Status: DC

## 2019-06-22 MED FILL — FREESTYLE LITE METER: 25 days supply | Qty: 1 | Fill #0

## 2019-06-22 MED FILL — FREESTYLE LANCETS: 25 days supply | Qty: 100 | Fill #0

## 2019-06-22 MED FILL — FREESTYLE LITE TEST STRIP: 25 days supply | Qty: 100 | Fill #0

## 2019-06-23 LAB — COMPREHENSIVE METABOLIC PANEL
ALT: 28 IU/L (ref 0–32)
AST: 21 IU/L (ref 0–40)
Albumin/Globulin Ratio: 1.8 (ref 1.2–2.2)
Albumin: 4.6 g/dL (ref 3.8–4.9)
Alkaline Phosphatase: 77 IU/L (ref 39–117)
BUN/Creatinine Ratio: 24 (ref 12–28)
BUN: 15 mg/dL (ref 8–27)
Bilirubin Total: 0.7 mg/dL (ref 0.0–1.2)
CO2: 22 mmol/L (ref 20–29)
Calcium: 9.4 mg/dL (ref 8.7–10.3)
Chloride: 104 mmol/L (ref 96–106)
Creatinine, Ser: 0.63 mg/dL (ref 0.57–1.00)
GFR calc Af Amer: 113 mL/min/{1.73_m2} (ref >59)
GFR calc non Af Amer: 98 mL/min/{1.73_m2} (ref >59)
Globulin, Total: 2.6 g/dL (ref 1.5–4.5)
Glucose: 114 mg/dL — ABNORMAL HIGH (ref 65–99)
Potassium: 4.2 mmol/L (ref 3.5–5.2)
Sodium: 143 mmol/L (ref 134–144)
Total Protein: 7.2 g/dL (ref 6.0–8.5)

## 2019-06-23 LAB — LIPID PANEL WITH LDL/HDL RATIO
Cholesterol, Total: 152 mg/dL (ref 100–199)
HDL: 71 mg/dL (ref >39)
LDL Calculated: 66 mg/dL (ref 0–99)
LDl/HDL Ratio: 0.9 ratio (ref 0.0–3.2)
Triglycerides: 73 mg/dL (ref 0–149)
VLDL Cholesterol Cal: 15 mg/dL (ref 5–40)

## 2019-06-23 LAB — HEMOGLOBIN A1C
Est. average glucose Bld gHb Est-mCnc: 166 mg/dL
Hgb A1c MFr Bld: 7.4 % — ABNORMAL HIGH (ref 4.8–5.6)

## 2019-06-23 LAB — TSH: TSH: 0.817 u[IU]/mL (ref 0.450–4.500)

## 2019-06-23 LAB — T3: T3, Total: 101 ng/dL (ref 71–180)

## 2019-06-23 LAB — INSULIN, RANDOM: INSULIN: 13.6 u[IU]/mL (ref 2.6–24.9)

## 2019-06-23 LAB — VITAMIN D 25 HYDROXY (VIT D DEFICIENCY, FRACTURES): Vit D, 25-Hydroxy: 26 ng/mL — ABNORMAL LOW (ref 30.0–100.0)

## 2019-06-23 LAB — T4, FREE: Free T4: 1.21 ng/dL (ref 0.82–1.77)

## 2019-06-23 NOTE — Progress Notes (Signed)
Office: [phone]  /  Fax: [phone]   Dear Dr. Wolfgang Valencia,   Thank you for referring Laura Valencia to our clinic. The following note includes my evaluation and treatment recommendations.  HPI:   Chief Complaint: OBESITY    Laura Valencia has been referred by Laura Valencia. Laura Phoenix, MD for consultation regarding her obesity and obesity related comorbidities.    Laura Valencia (MR# [id number]) is a 58 y.o. female who presents on 06/22/2019 for obesity evaluation and treatment. Current BMI is Body mass index is 40.35 kg/m.Laura Valencia has been struggling with her weight for many years and has been unsuccessful in either losing weight, maintaining weight loss, or reaching her healthy weight goal.     Laura Valencia attended our information session and states she is currently in the action stage of change and ready to dedicate time achieving and maintaining a healthier weight. Laura Valencia is interested in becoming our patient and working on intensive lifestyle modifications including (but not limited to) diet, exercise and weight loss.    Laura Valencia states her family eats meals together she thinks her family will eat healthier with  her her desired weight loss is 90 lbs she has been heavy most of her life her heaviest weight ever was 280 lbs. she considers sweets and sugar in excess, to be her biggest problem  she snacks frequently in the evenings she is frequently drinking liquids with calories she frequently makes poor food choices she states she has excessive hunger  she frequently eats larger portions than normal  she struggles with emotional eating    Fatigue Laura Valencia feels her energy is lower than it should be. This has worsened with weight gain and has not worsened recently. Laura Valencia admits to daytime somnolence and she admits to waking up still tired. Patient has a diagnosis of obstructive sleep apnea which may contribute to her fatigue. Patent has a history of symptoms of daytime  fatigue, morning fatigue and morning headache. Patient generally gets 5 or 6 hours of sleep per night, and states they generally have restless sleep. Snoring is present. Apneic episodes are present. Epworth Sleepiness Score is 9  Dyspnea on exertion Laura Valencia notes certain activities exacerbate shortness of breath and this seems to be worsening over time with weight gain. She notes getting out of breath sooner with activity than she used to. This has not gotten worse recently. Laura Valencia denies orthopnea.  Diabetes II without complication Laura Valencia has a diagnosis of diabetes type II. Last A1c was at 7.1 Laura Valencia is currently taking Laura Valencia. She is attempting to work on intensive lifestyle modifications including diet, exercise, and weight loss to help control her blood glucose levels. Laura Valencia admits to polyphagia.  Sleep Apnea with Hypersomnia Laura Valencia has a diagnosis of sleep apnea with hypersomnia. She wears CPAP 60% of the time. She feels more rested with CPAP.  Mixed Hyperlipidemia Laura Valencia has mixed hyperlipidemia and she is attempting to improve her cholesterol levels with intensive lifestyle modification including a low saturated fat diet, exercise and weight loss. Laura Valencia is currently taking Crestor. She denies myalgias.  At risk for cardiovascular disease Laura Valencia is at a higher than average risk for cardiovascular disease due to obesity and mixed hyperlipidemia. She currently denies any chest pain.  Vitamin D deficiency Laura Valencia has a diagnosis of vitamin D deficiency. Laura Valencia is not currently taking vit D and she denies nausea, vomiting or muscle weakness.  Depression with emotional eating behaviors Laura Valencia is struggling with boredom and stress eating and using food for comfort  to the extent that it is negatively impacting her health. She often snacks when she is not hungry. Laura Valencia sometimes feels she is out of control and then feels guilty that she made poor food choices. Laura Valencia  craves sweets. She is attempting to work on behavior modification techniques to help reduce her emotional eating. She shows no sign of suicidal or homicidal ideations. PHQ-9 Score is 19  Depression Screen Laura Valencia's Food and Mood (modified PHQ-9) score was  Depression screen PHQ 2/9 06/22/2019  Decreased Interest 2  Down, Depressed, Hopeless 0  PHQ - 2 Score 2  Altered sleeping 3  Tired, decreased energy 3  Change in appetite 3  Feeling bad or failure about yourself  3  Trouble concentrating 2  Moving slowly or fidgety/restless 3  Suicidal thoughts 0  PHQ-9 Score 19  Difficult doing work/chores Very difficult    ASSESSMENT AND PLAN:  Other fatigue - Plan: EKG 12-Lead, Laura Valencia, Laura Valencia, free, TSH  Shortness of breath on exertion  Type 2 diabetes mellitus without complication, without long-term current use of insulin (HCC) - Plan: Comprehensive metabolic panel, Hemoglobin A1c, Insulin, random, blood glucose meter kit and supplies  Hypersomnia with sleep apnea  Other hyperlipidemia - Plan: Lipid Panel With LDL/HDL Ratio  Vitamin D deficiency - Plan: VITAMIN D 25 Hydroxy (Vit-D Deficiency, Fractures)  Other depression - with emotional eating  At risk for heart disease  Class 3 severe obesity with serious comorbidity and body mass index (BMI) of 40.0 to 44.9 in adult, unspecified obesity type (HCC)  PLAN:  Fatigue Laura Valencia was informed that her fatigue may be related to obesity, depression or many other causes. Labs will be ordered, and in the meanwhile [name] has agreed to work on diet, exercise and weight loss to help with fatigue. Proper sleep hygiene was discussed including the need for 7-8 hours of quality sleep each night.   Dyspnea on exertion Laura Valencia's shortness of breath appears to be obesity related and exercise induced. She has agreed to work on weight loss and gradually increase exercise to treat her exercise induced shortness of breath. If Noralyn follows our  instructions and loses weight without improvement of her shortness of breath, we will plan to refer to pulmonology. We will monitor this condition regularly. Dreama agrees to this plan.  Diabetes II without complication Laura Valencia has been given extensive diabetes education by myself today including ideal fasting and post-prandial blood glucose readings, individual ideal Hgb A1c goals and hypoglycemia prevention. We discussed the importance of good blood sugar control to decrease the likelihood of diabetic complications such as nephropathy, neuropathy, limb loss, blindness, coronary artery disease, and death. We discussed the importance of intensive lifestyle modification including diet, exercise and weight loss as the first line treatment for diabetes. Aishah will continue Laura Valencia. She will check her fasting blood sugars and 2 hour post prandial blood sugars. Prescription was written today for Glucose meter kit, with needles and strips. Diara will follow up at the agreed upon time.  Sleep Apnea with Hypersomnia  Laura Valencia will increase her use of CPAP. She will follow up with our clinic in 2 weeks.  Mixed Hyperlipidemia Laura Valencia was informed of the American Heart Association Guidelines emphasizing intensive lifestyle modifications as the first line treatment for mixed hyperlipidemia. We discussed many lifestyle modifications today in depth, and Caitriona will continue to work on decreasing saturated fats such as fatty red meat, butter and many fried foods. She will also increase vegetables and lean protein in her diet and begin  to work on exercise and weight loss efforts. Jenavie will continue Crestor and follow up as directed.  Cardiovascular risk counseling Laura Valencia was given extended (15 minutes) coronary artery disease prevention counseling today. She is 58 y.o. female and has risk factors for heart disease including obesity and mixed hyperlipidemia. We discussed intensive lifestyle  modifications today with an emphasis on specific weight loss instructions and strategies. Pt was also informed of the importance of increasing exercise and decreasing saturated fats to help prevent heart disease.  Vitamin D Deficiency Laura Valencia was informed that low vitamin D levels contributes to fatigue and are associated with obesity, breast, and colon cancer. We will check vitamin D level and she will follow up for routine testing of vitamin D, at least 2-3 times per year.   Depression with Emotional Eating Behaviors We discussed behavior modification techniques today to help Laura Valencia deal with her emotional eating and depression. We will schedule Laura Valencia with Dr. Mallie Mussel our bariatric psychologist.  Depression Screen Laura Valencia had a strongly positive depression screening. Depression is commonly associated with obesity and often results in emotional eating behaviors. We will monitor this closely and work on CBT to help improve the non-hunger eating patterns.   Obesity Laura Valencia is currently in the action stage of change and her goal is to continue with weight loss efforts. I recommend Laura Valencia begin the structured treatment plan as follows:  She has agreed to follow the category 3 plan  Laura Valencia has been instructed to eventually work up to a goal of 150 minutes of combined cardio and strengthening exercise per week for weight loss and overall health benefits. We discussed the following Behavioral Modification Strategies today: increase H2O intake, no skipping meals, keeping healthy foods in the home, increasing lean protein intake, decreasing simple carbohydrates, increasing vegetables, decrease eating out and work on meal planning and easy cooking plans Dannelle will work on decreasing sweets. Handouts for Category 3 and grocery list were provided to patient today. Snack sheet and fruit sheet was given to patient today.   She was informed of the importance of frequent follow up visits to  maximize her success with intensive lifestyle modifications for her multiple health conditions. She was informed we would discuss her lab results at her next visit unless there is a critical issue that needs to be addressed sooner. Hadasa agreed to keep her next visit at the agreed upon time to discuss these results.  ALLERGIES: No Known Allergies  MEDICATIONS: Current Outpatient Medications on File Prior to Visit  Medication Sig Dispense Refill   dipyridamole (PERSANTINE) 25 MG tablet Take 25 mg by mouth 4 (four) times daily.     sitaGLIPtin-metformin (Laura Valencia) 50-1000 MG tablet Take 1 tablet by mouth 2 (two) times daily with a meal.     aspirin 81 MG tablet Take 81 mg by mouth daily.     fluticasone (FLONASE) 50 MCG/ACT nasal spray Place 1 spray into both nostrils daily. 16 g 1   rosuvastatin (CRESTOR) 5 MG tablet Take 1 tablet (5 mg total) by mouth daily. 90 tablet 1   No current facility-administered medications on file prior to visit.     PAST MEDICAL HISTORY: Past Medical History:  Diagnosis Date   Arthritis    knees   Colon polyp    TYPE UNKNOWN   Diabetes mellitus, type 2 ([place])    Hyperthyroidism    HX; RX IN THE PAST   OSA (obstructive sleep apnea)    [facility] 06-19-2010 AHI 9.3/HR   Osteoarthritis  Seasonal rhinitis    MILD   Sleep apnea    occasional uses CPAP    PAST SURGICAL HISTORY: Past Surgical History:  Procedure Laterality Date   BREAST LUMPECTOMY     LEFT -BENIGN   CARPAL TUNNEL RELEASE     BILATERAL   COLONOSCOPY  11/2010   [facility]   TRIGGER FINGER RELEASE Left    middle finger    SOCIAL HISTORY: Social History   Tobacco Use   Smoking status: Never Smoker   Smokeless tobacco: Never Used  Substance Use Topics   Alcohol use: Yes    Comment: RARE USE OF ETOH   Drug use: No    FAMILY HISTORY: Family History  Problem Relation Age of Onset   Colon cancer Mother 36   Esophageal cancer Neg Hx    Stomach cancer  Neg Hx    Rectal cancer Neg Hx     ROS: Review of Systems  Constitutional: Positive for malaise/fatigue.  Eyes:       + Wear Glasses or Contacts  Respiratory: Positive for shortness of breath (on exertion).   Cardiovascular: Negative for chest pain and orthopnea.  Gastrointestinal: Negative for nausea and vomiting.  Musculoskeletal: Negative for myalgias.       + Red or Swollen Joints  Endo/Heme/Allergies:       Positive for polyphagia Positive for sweet cravings  Psychiatric/Behavioral: Positive for depression. Negative for suicidal ideas.    PHYSICAL EXAM: Blood pressure 112/75, pulse 73, temperature 98 F (36.7 C), temperature source Oral, height '5\' 6"'$  (1.676 m), weight 250 lb (113.4 kg), last menstrual period 03/19/2012, SpO2 99 %. Body mass index is 40.35 kg/m. Physical Exam Vitals signs reviewed.  Constitutional:      Appearance: Normal appearance. She is well-developed. She is obese.  HENT:     Head: Normocephalic and atraumatic.     Nose: Nose normal.  Eyes:     General: No scleral icterus.    Extraocular Movements: Extraocular movements intact.     Right eye: Normal extraocular motion.     Left eye: Normal extraocular motion.  Neck:     Musculoskeletal: Normal range of motion and neck supple.     Thyroid: No thyromegaly.  Cardiovascular:     Rate and Rhythm: Normal rate and regular rhythm.  Pulmonary:     Effort: Pulmonary effort is normal. No respiratory distress.  Abdominal:     Palpations: Abdomen is soft.     Tenderness: There is no abdominal tenderness.  Musculoskeletal: Normal range of motion.     Comments: Range of Motion normal in all 4 extremities  Skin:    General: Skin is warm and dry.  Neurological:     Mental Status: She is alert and oriented to person, place, and time.     Coordination: Coordination normal.  Psychiatric:        Mood and Affect: Mood normal.        Behavior: Behavior normal.     RECENT LABS AND TESTS: BMET      Component Value Date/Time   NA 143 06/22/2019 1220   K 4.2 06/22/2019 1220   CL 104 06/22/2019 1220   CO2 22 06/22/2019 1220   GLUCOSE 114 (H) 06/22/2019 1220   GLUCOSE 122 (H) 03/26/2018 0745   BUN 15 06/22/2019 1220   CREATININE 0.63 06/22/2019 1220   CREATININE 0.81 03/26/2018 0745   CALCIUM 9.4 06/22/2019 1220   GFRNONAA 98 06/22/2019 1220   GFRNONAA 80 03/26/2018 0745  GFRAA 113 06/22/2019 1220   GFRAA 93 03/26/2018 0745   Lab Results  Component Value Date   HGBA1C 7.4 (H) 06/22/2019   Lab Results  Component Value Date   INSULIN 13.6 06/22/2019   CBC    Component Value Date/Time   WBC 5.4 10/01/2013 1729   RBC 5.13 (H) 10/01/2013 1729   HGB 13.7 10/01/2013 1729   HCT 42.0 10/01/2013 1729   PLT 274 10/01/2013 1729   MCV 81.9 10/01/2013 1729   MCH 26.7 10/01/2013 1729   MCHC 32.6 10/01/2013 1729   RDW 14.0 10/01/2013 1729   LYMPHSABS 2.6 10/01/2013 1729   MONOABS 0.6 10/01/2013 1729   EOSABS 0.0 10/01/2013 1729   BASOSABS 0.0 10/01/2013 1729   Iron/TIBC/Ferritin/ %Sat No results found for: IRON, TIBC, FERRITIN, IRONPCTSAT Lipid Panel     Component Value Date/Time   CHOL 152 06/22/2019 1220   TRIG 73 06/22/2019 1220   HDL 71 06/22/2019 1220   CHOLHDL 2.1 01/03/2019 1041   CHOLHDL 2.2 03/26/2018 0745   VLDL 15 10/01/2013 1729   LDLCALC 66 06/22/2019 1220   LDLCALC 62 03/26/2018 0745   Hepatic Function Panel     Component Value Date/Time   PROT 7.2 06/22/2019 1220   ALBUMIN 4.6 06/22/2019 1220   AST 21 06/22/2019 1220   ALT 28 06/22/2019 1220   ALKPHOS 77 06/22/2019 1220   BILITOT 0.7 06/22/2019 1220   BILIDIR 0.20 01/03/2019 1041      Component Value Date/Time   TSH 0.817 06/22/2019 1220   TSH 0.85 07/09/2017 0957   TSH 1.02 03/21/2017    ECG  shows NSR with a rate of 63 BPM INDIRECT CALORIMETER done today shows a VO2 of 258 and a REE of 1798.  Her calculated basal metabolic rate is 2409 thus her basal metabolic rate is better than  expected.      OBESITY BEHAVIORAL INTERVENTION VISIT  Today's visit was # 1   Starting weight: 250 lbs Starting date: 06/22/2019 Today's weight : 250 lbs Today's date: 06/22/2019 Total lbs lost to date: 0    06/22/2019  Height '5\' 6"'$  (1.676 m)  Weight 250 lb (113.4 kg)  BMI (Calculated) 40.37  BLOOD PRESSURE - SYSTOLIC 735  BLOOD PRESSURE - DIASTOLIC 75  Waist Measurement  42 inches   Body Fat % 52.5 %  Total Body Water (lbs) 90.2 lbs  RMR 1798    ASK: We discussed the diagnosis of obesity with Fayrene Helper today and Laura Valencia agreed to give [name] permission to discuss obesity behavioral modification therapy today.  ASSESS: Mariea has the diagnosis of obesity and her BMI today is 40.37 Nanami is in the action stage of change   ADVISE: Faatima was educated on the multiple health risks of obesity as well as the benefit of weight loss to improve her health. She was advised of the need for long term treatment and the importance of lifestyle modifications to improve her current health and to decrease her risk of future health problems.  AGREE: Multiple dietary modification options and treatment options were discussed and  Oliana agreed to follow the recommendations documented in the above note.  ARRANGE: Ardenia was educated on the importance of frequent visits to treat obesity as outlined per CMS and USPSTF guidelines and agreed to schedule her next follow up appointment today.  Corey Skains, am acting as Location manager for [employer]. Owens Shark, DO  I have reviewed the above documentation for accuracy and completeness, and I agree with  the above. -[name], DO

## 2019-06-24 ENCOUNTER — Encounter (INDEPENDENT_AMBULATORY_CARE_PROVIDER_SITE_OTHER): Payer: Self-pay | Admitting: Bariatrics

## 2019-07-07 MED FILL — RESTASIS 0.05% EYE EMULSION: 0.05 | 90 days supply | Qty: 180 | Fill #0

## 2019-07-07 NOTE — Progress Notes (Signed)
Office: [phone]  /  Fax: [phone]    Date: July 08, 2019   Appointment Start Time: 2:02pm Duration: 35 minutes Provider: Glennie Isle, Psy.D. Type of Session: Intake for Individual Therapy  Location of Patient: Work Location of Provider: [facility] Type of Contact: Telepsychological Visit via [employer]  Informed Consent: Prior to proceeding with today's appointment, two pieces of identifying information were obtained from [name] to verify identity. In addition, [name]'s physical location at the time of this appointment was obtained. Laura Valencia reported she was at work and provided the address. In the event of technical difficulties, Laura Valencia shared a phone number she could be reached at. Laura Valencia and this provider participated in today's telepsychological service. Also, [name] denied anyone else being present in the room or on the WebEx appointment.   The provider's role was explained to Laura Valencia. The provider reviewed and discussed issues of confidentiality, privacy, and limits therein (e.g., reporting obligations). In addition to verbal informed consent, written informed consent for psychological services was obtained from [name] prior to the initial intake interview. Written consent included information concerning the practice, financial arrangements, and confidentiality and patients' rights. Since the clinic is not a 24/7 crisis center, mental health emergency resources were shared, and the provider explained MyChart, e-mail, voicemail, and/or other messaging systems should be utilized only for non-emergency reasons. This provider also explained that information obtained during appointments will be placed in [name]'s medical record in a confidential manner and relevant information will be shared with other providers at [facility] that she meets with for coordination of care. Laura Valencia verbally acknowledged understanding of the  aforementioned, and agreed to use mental health emergency resources discussed if needed. Moreover, Laura Valencia agreed information may be shared with other [facility] providers as needed for coordination of care. By signing the service agreement document, Laura Valencia provided written consent for coordination of care.   Prior to initiating telepsychological services, [name] was provided with an informed consent document, which included the development of a safety plan (i.e., an emergency contact and emergency resources) in the event of an emergency/crisis. [name] expressed understanding of the rationale of the safety plan and provided consent for this provider to reach out to her emergency contact in the event of an emergency/crisis. [name] returned the completed consent form prior to today's appointment. This provider verbally reviewed the consent form during today's appointment prior to proceeding with the appointment. Laura Valencia verbally acknowledged understanding that she is ultimately responsible for understanding her insurance benefits as it relates to reimbursement of telepsychological and in-person services. This provider also reviewed confidentiality, as it relates to telepsychological services, as well as the rationale for telepsychological services. More specifically, this provider's clinic is limiting in-person visits due to COVID-19. Therapeutic services will resume to in-person appointments once deemed appropriate. Laura Valencia expressed understanding regarding the rationale for telepsychological services. In addition, this provider explained the telepsychological services informed consent document would be considered an addendum to the initial consent document/service agreement. Laura Valencia verbally consented to proceed.   Chief Complaint/HPI: Laura Valencia was referred by Dr. [name] due to depression with emotional eating behaviors. Per the note for the initial visit with Dr. [name] on  June 22, 2019, "[name] is struggling with boredom and stress eating and using food for comfort to the extent that it is negatively impacting her health. She often snacks when she is not hungry. [name] sometimes feels she is out of control and then feels guilty that she  made poor food choices. [name] craves sweets. She is attempting to work on behavior modification techniques to help reduce her emotional eating. She shows no sign of suicidal or homicidal ideations. PHQ-9 Score is 19." During the initial appointment, Laura Valencia further reported experiencing the following: snacking frequently in the evenings, frequently drinking liquids with calories, frequently making poor food choices, frequently eating larger portions than normal , struggling with emotional eating and having problems with excessive hunger. Additionally, "she considers sweets and sugar in excess, to be her biggest problem."  During today's appointment, Laura Valencia was verbally administered a questionnaire assessing various behaviors related to emotional eating. Laura Valencia endorsed the following: overeat when you are celebrating, experience food cravings on a regular basis, eat certain foods when you are anxious, stressed, depressed, or your feelings are hurt, overeat when you are angry or upset and eat to help you stay awake. Laura Valencia noted the onset of emotional eating was in adulthood, specifically due to the work place. She described the frequency as "4-5 days a week." She shared she craves sweets, such as chocolates and caramel. She noted she typically eats sweets at night. She denied a history of binge eating. Laura Valencia denied a history of restricting food intake, purging and engagement in other compensatory strategies, and has never been diagnosed with an eating disorder. She also denied a history of treatment for emotional eating. Moreover, Laura Valencia indicated stress triggers emotional eating, whereas being on vacation makes emotional eating  better. She acknowledged, "I cannot follow a diet." Thus, she was encouraged to discuss obstacles/barriers further with Dr. Owens Shark during their appointment today; Laura Valencia agreed. Furthermore, Laura Valencia denied other problems of concern at this time.    Mental Status Examination:  Appearance: neat Behavior: cooperative Mood: euthymic Affect: mood congruent Speech: normal in rate, volume, and tone Eye Contact: appropriate Psychomotor Activity: appropriate Thought Process: linear, logical, and goal directed  Content/Perceptual Disturbances: denies suicidal and homicidal ideation, plan, and intent and no hallucinations, delusions, bizarre thinking or behavior reported or observed Orientation: time, person, place and purpose of appointment Cognition/Sensorium: memory, attention, language, and fund of knowledge intact  Insight: good Judgment: good  Family & Psychosocial History: Laura Valencia reported she is married and she has two adult children (ages 53 and 64). 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 Additionally, Laura Valencia shared her highest level of education obtained is a doctorate degree (MD). Currently, Laura Valencia's social support system consists of her immediate family. Moreover, Aariah stated she resides with her husband, daughter, and father (age 40).   Medical History:  Past Medical History:  Diagnosis Date   Arthritis    knees   Colon polyp    TYPE UNKNOWN   Diabetes mellitus, type 2 ([place])    Hyperthyroidism    HX; RX IN THE PAST   OSA (obstructive sleep apnea)    [facility] 06-19-2010 AHI 9.3/HR   Osteoarthritis    Seasonal rhinitis    MILD   Sleep apnea    occasional uses CPAP   Past Surgical History:  Procedure Laterality Date   BREAST LUMPECTOMY     LEFT -BENIGN   CARPAL TUNNEL RELEASE     BILATERAL   COLONOSCOPY  11/2010   stark poylps   TRIGGER FINGER RELEASE Left    middle finger   Current Outpatient Medications on File Prior  to Visit  Medication Sig Dispense Refill   aspirin 81 MG tablet Take 81 mg by mouth daily.     blood glucose meter kit  and supplies Dispense based on patient and insurance preference. Use up to four times daily as directed. (FOR ICD-10 E10.9, E11.9). 1 each 0   dipyridamole (PERSANTINE) 25 MG tablet Take 25 mg by mouth 4 (four) times daily.     fluticasone (FLONASE) 50 MCG/ACT nasal spray Place 1 spray into both nostrils daily. 16 g 1   rosuvastatin (CRESTOR) 5 MG tablet Take 1 tablet (5 mg total) by mouth daily. 90 tablet 1   sitaGLIPtin-metformin (JANUMET) 50-1000 MG tablet Take 1 tablet by mouth 2 (two) times daily with a meal.     No current facility-administered medications on file prior to visit.   Ilianna denied a history of head injuries and loss of consciousness.   Mental Health History: Laura Valencia denied a history of mental health treatment, including therapeutic services. Laura Valencia denied a history of hospitalizations for psychiatric concerns, and has never met with a psychiatrist. Laura Valencia denied ever being prescribed psychotropic medications. Laura Valencia denied a family history of mental health related concerns. Laura Valencia also denied a trauma history, including psychological, physical  and sexual abuse, as well as neglect.   Betti described her typical mood as "good, positive, hopeful." Aside from concerns noted above and endorsed on the PHQ-9, Jaimarie reported experiencing frustration and she added, "I'm fed up with my weight." Siya denied current alcohol use. She denied tobacco use. She denied illicit substance use. Regarding caffeine intake, Aahna reported she used to "drink a lot." She has not consumed coffee in the past week. Furthermore, Laura Valencia denied experiencing the following: hopelessness, hallucinations and delusions, paranoia, mania and decreased motivation. She also denied history of and current suicidal ideation, plan, and intent; history of and current homicidal  ideation, plan, and intent; and history of and current engagement in self-harm.  The following strengths were reported by Laura Valencia: hopeful, forgiving, and seizing the moment. The following strengths were observed by this provider: ability to express thoughts and feelings during the therapeutic session, ability to establish and benefit from a therapeutic relationship, ability to learn and practice coping skills, willingness to work toward established goal(s) with the clinic and ability to engage in reciprocal conversation.  Legal History: Katieann denied a history of legal involvement.   Structured Assessment Results: The Patient Health Questionnaire-9 (PHQ-9) is a self-report measure that assesses symptoms and severity of depression over the course of the last two weeks. Nadiyah obtained a score of 2 suggesting minimal depression. Tijana finds the endorsed symptoms to be not difficult at all. Little interest or pleasure in doing things 0  Feeling down, depressed, or hopeless 0  Trouble falling or staying asleep, or sleeping too much 0  Feeling tired or having little energy 0  Poor appetite or overeating 1  Feeling bad about yourself --- or that you are a failure or have let yourself or your family down 1  Trouble concentrating on things, such as reading the newspaper or watching television 0  Moving or speaking so slowly that other people could have noticed? Or the opposite --- being so fidgety or restless that you have been moving around a lot more than usual 0  Thoughts that you would be better off dead or hurting yourself in some way 0  PHQ-9 Score 2    The Generalized Anxiety Disorder-7 (GAD-7) is a brief self-report measure that assesses symptoms of anxiety over the course of the last two weeks. Tayli obtained a score of 0. Feeling nervous, anxious, on edge 0  Not being able to stop or control  worrying 0  Worrying too much about different things 0  Trouble relaxing 0  Being so  restless that it's hard to sit still 0  Becoming easily annoyed or irritable 0  Feeling afraid as if something awful might happen 0  GAD-7 Score 0   Interventions: A chart review was conducted prior to the clinical intake interview. The PHQ-9, and GAD-7 were verbally administered as well as a Mood and Food questionnaire to assess various behaviors related to emotional eating. Throughout session, empathic reflections and validation was provided. Continuing treatment with this provider was discussed; however, future appointments were declined at this time. Nevertheless, psychoeducation regarding emotional versus physical hunger was provided. Angeliah was sent a handout via e-mail  to increase awareness of hunger patterns and subsequent eating. Olevia provided verbal consent during today's appointment for this provider to send the handout via e-mail.   Provisional DSM-5 Diagnosis: 311 (F32.8) Other Specified Depressive Disorder, Emotional Eating Behaviors  Plan: Lara declined future appointments with this provider, but acknowledged understanding that she may request a follow-up appointment with this provider in the future.

## 2019-07-08 ENCOUNTER — Encounter (INDEPENDENT_AMBULATORY_CARE_PROVIDER_SITE_OTHER): Payer: Self-pay | Admitting: Bariatrics

## 2019-07-08 ENCOUNTER — Ambulatory Visit (INDEPENDENT_AMBULATORY_CARE_PROVIDER_SITE_OTHER): Payer: 59 | Admitting: Psychology

## 2019-07-08 ENCOUNTER — Ambulatory Visit (INDEPENDENT_AMBULATORY_CARE_PROVIDER_SITE_OTHER): Payer: 59 | Admitting: Bariatrics

## 2019-07-08 ENCOUNTER — Other Ambulatory Visit: Payer: Self-pay

## 2019-07-08 VITALS — BP 104/70 | HR 59 | Temp 98.2°F | Ht 66.0 in | Wt 251.0 lb

## 2019-07-08 DIAGNOSIS — F3289 Other specified depressive episodes: Secondary | ICD-10-CM | POA: Diagnosis not present

## 2019-07-08 DIAGNOSIS — E119 Type 2 diabetes mellitus without complications: Secondary | ICD-10-CM | POA: Diagnosis not present

## 2019-07-08 DIAGNOSIS — Z9189 Other specified personal risk factors, not elsewhere classified: Secondary | ICD-10-CM | POA: Diagnosis not present

## 2019-07-08 DIAGNOSIS — E559 Vitamin D deficiency, unspecified: Secondary | ICD-10-CM

## 2019-07-08 DIAGNOSIS — Z6841 Body Mass Index (BMI) 40.0 and over, adult: Secondary | ICD-10-CM | POA: Diagnosis not present

## 2019-07-08 NOTE — Progress Notes (Signed)
Office: [phone]  /  Fax: [phone]   HPI:   Chief Complaint: OBESITY Laura Valencia is here to discuss her progress with her obesity treatment plan. She is on the Category 3 plan and is following her eating plan approximately 20% of the time. She states she is biking 40 minutes 2 times per week. Laura Valencia is up 1 lb (1st follow-up). She states she has not shopped for the food and has been under a lot of stress.  Her weight is 251 lb (113.9 kg) today and has had a weight gain of 1 lb since her last visit. She has lost 0 lbs since starting treatment with [name].  Diabetes II Laura Valencia has a diagnosis of diabetes type II. Laura Valencia does not report checking her blood sugars. Last A1c was 7.4 on 06/22/2019 and insulin was 13.6. She has been working on intensive lifestyle modifications including diet, exercise, and weight loss to help control her blood glucose levels.  Vitamin D deficiency Laura Valencia has a diagnosis of Vitamin D deficiency. She is not currently taking prescription Vit D and denies nausea, vomiting or muscle weakness.  At risk for osteopenia and osteoporosis Laura Valencia is at higher risk of osteopenia and osteoporosis due to Vitamin D deficiency.   ASSESSMENT AND PLAN:  Type 2 diabetes mellitus without complication, without long-term current use of insulin ([place]) - Plan: Liraglutide -Weight Management (SAXENDA) 18 MG/3ML SOPN  Vitamin D deficiency - Plan: Vitamin D, Ergocalciferol, (DRISDOL) 1.25 MG (50000 UT) CAPS capsule  At risk for osteoporosis  Class 3 severe obesity with serious comorbidity and body mass index (BMI) of 40.0 to 44.9 in adult, unspecified obesity type ([place])  PLAN:  Diabetes II Laura Valencia has been given extensive diabetes education by myself today including ideal fasting and post-prandial blood glucose readings, individual ideal HgA1c goals  and hypoglycemia prevention. We discussed the importance of good blood sugar control to decrease the likelihood of diabetic  complications such as nephropathy, neuropathy, limb loss, blindness, coronary artery disease, and death. We discussed the importance of intensive lifestyle modification including diet, exercise and weight loss as the first line treatment for diabetes. Laura Valencia agrees to continue her diabetes medications and will follow-up at the agreed upon time.  Vitamin D Deficiency Laura Valencia was informed that low Vitamin D levels contributes to fatigue and are associated with obesity, breast, and colon cancer. She was given a prescription for Vit D 50,000 IU once weekly #4 with 0 refills and will follow-up for routine testing of Vitamin D, at least 2-3 times per year. She was informed of the risk of over-replacement of Vitamin D and agrees to not increase her dose unless she discusses this with [name] first. Laura Valencia agrees to follow-up with our clinic in 4 weeks.  At risk for osteopenia and osteoporosis Laura Valencia was given extended  (15 minutes) osteoporosis prevention counseling today. Laura Valencia is at risk for osteopenia and osteoporsis due to her Vitamin D deficiency. She was encouraged to take her Vitamin D and follow her higher calcium diet and increase strengthening exercise to help strengthen her bones and decrease her risk of osteopenia and osteoporosis.  Obesity Laura Valencia is currently in the action stage of change. As such, her goal is to continue with weight loss efforts. She has agreed to follow the Category 3 plan. Laura Valencia will work on meal planning and intentional eating. Laura Valencia has been instructed to work up to a goal of 150 minutes of combined cardio and strengthening exercise per week for weight loss and overall health benefits.  We discussed the following Behavioral Modification Strategies today: increasing lean protein intake, decreasing simple carbohydrates, increasing vegetables, increase H20 intake, decrease eating out, no skipping meals, work on meal planning, easy cooking plans, and keeping healthy  foods in the home. We discussed various medication options to help Laura Valencia with her weight loss efforts and we both agreed to start Saxenda 3.0 mg daily (start at 0.6 mg) and needles x 1 month supply. She agrees to follow-up with our clinic in 4 weeks.  Laura Valencia has agreed to follow-up with our clinic in 4 weeks. She was informed of the importance of frequent follow-up visits to maximize her success with intensive lifestyle modifications for her multiple health conditions.  ALLERGIES: No Known Allergies  MEDICATIONS: Current Outpatient Medications on File Prior to Visit  Medication Sig Dispense Refill  . aspirin 81 MG tablet Take 81 mg by mouth daily.    . blood glucose meter kit and supplies Dispense based on patient and insurance preference. Use up to four times daily as directed. (FOR ICD-10 E10.9, E11.9). 1 each 0  . dipyridamole (PERSANTINE) 25 MG tablet Take 25 mg by mouth 4 (four) times daily.    . fluticasone (FLONASE) 50 MCG/ACT nasal spray Place 1 spray into both nostrils daily. 16 g 1  . rosuvastatin (CRESTOR) 5 MG tablet Take 1 tablet (5 mg total) by mouth daily. 90 tablet 1  . sitaGLIPtin-metformin (JANUMET) 50-1000 MG tablet Take 1 tablet by mouth 2 (two) times daily with a meal.     No current facility-administered medications on file prior to visit.     PAST MEDICAL HISTORY: Past Medical History:  Diagnosis Date  . Arthritis    knees  . Colon polyp    TYPE UNKNOWN  . Diabetes mellitus, type 2 ([place])   . Hyperthyroidism    HX; RX IN THE PAST  . OSA (obstructive sleep apnea)    NPSH 06-19-2010 AHI 9.3/HR  . Osteoarthritis   . Seasonal rhinitis    MILD  . Sleep apnea    occasional uses CPAP    PAST SURGICAL HISTORY: Past Surgical History:  Procedure Laterality Date  . BREAST LUMPECTOMY     LEFT -BENIGN  . CARPAL TUNNEL RELEASE     BILATERAL  . COLONOSCOPY  11/2010   stark poylps  . TRIGGER FINGER RELEASE Left    middle finger    SOCIAL HISTORY: Social  History   Tobacco Use  . Smoking status: Never Smoker  . Smokeless tobacco: Never Used  Substance Use Topics  . Alcohol use: Yes    Comment: RARE USE OF ETOH  . Drug use: No    FAMILY HISTORY: Family History  Problem Relation Age of Onset  . Colon cancer Mother 70  . Esophageal cancer Neg Hx   . Stomach cancer Neg Hx   . Rectal cancer Neg Hx    ROS: Review of Systems  Gastrointestinal: Negative for nausea and vomiting.  Musculoskeletal:       Negative for muscle weakness.   PHYSICAL EXAM: Blood pressure 104/70, pulse (!) 59, temperature 98.2 F (36.8 C), temperature source Oral, height '5\' 6"'  (1.676 m), weight 251 lb (113.9 kg), last menstrual period 03/19/2012, SpO2 99 %. Body mass index is 40.51 kg/m. Physical Exam Vitals signs reviewed.  Constitutional:      Appearance: Normal appearance. She is obese.  Cardiovascular:     Rate and Rhythm: Normal rate.     Pulses: Normal pulses.  Pulmonary:  Effort: Pulmonary effort is normal.     Breath sounds: Normal breath sounds.  Musculoskeletal: Normal range of motion.  Skin:    General: Skin is warm and dry.  Neurological:     Mental Status: She is alert and oriented to person, place, and time.  Psychiatric:        Behavior: Behavior normal.   RECENT LABS AND TESTS: BMET    Component Value Date/Time   NA 143 06/22/2019 1220   K 4.2 06/22/2019 1220   CL 104 06/22/2019 1220   CO2 22 06/22/2019 1220   GLUCOSE 114 (H) 06/22/2019 1220   GLUCOSE 122 (H) 03/26/2018 0745   BUN 15 06/22/2019 1220   CREATININE 0.63 06/22/2019 1220   CREATININE 0.81 03/26/2018 0745   CALCIUM 9.4 06/22/2019 1220   GFRNONAA 98 06/22/2019 1220   GFRNONAA 80 03/26/2018 0745   GFRAA 113 06/22/2019 1220   GFRAA 93 03/26/2018 0745   Lab Results  Component Value Date   HGBA1C 7.4 (H) 06/22/2019   HGBA1C 7.1 (H) 01/03/2019   HGBA1C 6.5 (H) 08/01/2018   HGBA1C 6.7 (H) 03/26/2018   HGBA1C 6.5 (H) 11/06/2017   Lab Results  Component  Value Date   INSULIN 13.6 06/22/2019   CBC    Component Value Date/Time   WBC 5.4 10/01/2013 1729   RBC 5.13 (H) 10/01/2013 1729   HGB 13.7 10/01/2013 1729   HCT 42.0 10/01/2013 1729   PLT 274 10/01/2013 1729   MCV 81.9 10/01/2013 1729   MCH 26.7 10/01/2013 1729   MCHC 32.6 10/01/2013 1729   RDW 14.0 10/01/2013 1729   LYMPHSABS 2.6 10/01/2013 1729   MONOABS 0.6 10/01/2013 1729   EOSABS 0.0 10/01/2013 1729   BASOSABS 0.0 10/01/2013 1729   Iron/TIBC/Ferritin/ %Sat No results found for: IRON, TIBC, FERRITIN, IRONPCTSAT Lipid Panel     Component Value Date/Time   CHOL 152 06/22/2019 1220   TRIG 73 06/22/2019 1220   HDL 71 06/22/2019 1220   CHOLHDL 2.1 01/03/2019 1041   CHOLHDL 2.2 03/26/2018 0745   VLDL 15 10/01/2013 1729   LDLCALC 66 06/22/2019 1220   LDLCALC 62 03/26/2018 0745   Hepatic Function Panel     Component Value Date/Time   PROT 7.2 06/22/2019 1220   ALBUMIN 4.6 06/22/2019 1220   AST 21 06/22/2019 1220   ALT 28 06/22/2019 1220   ALKPHOS 77 06/22/2019 1220   BILITOT 0.7 06/22/2019 1220   BILIDIR 0.20 01/03/2019 1041      Component Value Date/Time   TSH 0.817 06/22/2019 1220   TSH 0.85 07/09/2017 0957   TSH 1.02 03/21/2017   Results for MAIYA, KATES (MRN [id number]) as of 07/08/2019 16:49  Ref. Range 06/22/2019 12:20  Vitamin D, 25-Hydroxy Latest Ref Range: 30.0 - 100.0 ng/mL 26.0 (L)   OBESITY BEHAVIORAL INTERVENTION VISIT  Today's visit was #2  Starting weight: 250 lbs Starting date: 06/22/2019 Today's weight: 251 lbs  Today's date: 07/08/2019 Total lbs lost to date: 0   07/08/2019  Height '5\' 6"'  (1.676 m)  Weight 251 lb (113.9 kg)  BMI (Calculated) 40.53  BLOOD PRESSURE - SYSTOLIC 086  BLOOD PRESSURE - DIASTOLIC 70   Body Fat % 47 %  Total Body Water (lbs) 104.4 lbs   ASK: We discussed the diagnosis of obesity with Laura Valencia today and Laura Valencia agreed to give [name] permission to discuss obesity behavioral modification therapy  today.  ASSESS: Laura Valencia has the diagnosis of obesity and her BMI today is 40.5.  Laura Valencia is in the action stage of change.   ADVISE: Laura Valencia was educated on the multiple health risks of obesity as well as the benefit of weight loss to improve her health. She was advised of the need for long term treatment and the importance of lifestyle modifications to improve her current health and to decrease her risk of future health problems.  AGREE: Multiple dietary modification options and treatment options were discussed and  Laura Valencia agreed to follow the recommendations documented in the above note.  ARRANGE: Laura Valencia was educated on the importance of frequent visits to treat obesity as outlined per CMS and USPSTF guidelines and agreed to schedule her next follow up appointment today.  Laura Valencia, am acting as Location manager for [employer], DO  I have reviewed the above documentation for accuracy and completeness, and I agree with the above. -[name], DO

## 2019-07-09 MED ORDER — VITAMIN D (ERGOCALCIFEROL) 1.25 MG (50000 UNIT) PO CAPS: 50000.0000 [IU] | ORAL_CAPSULE | ORAL | 0 refills | Status: DC

## 2019-07-09 MED ORDER — SAXENDA 18 MG/3ML ~~LOC~~ SOPN: 3.0000 mg | PEN_INJECTOR | Freq: Every day | SUBCUTANEOUS | 0 refills | Status: DC

## 2019-07-09 MED ORDER — BD PEN NEEDLE NANO U/F 32G X 4 MM MISC: 1.0000 | Freq: Every day | 0 refills | Status: DC

## 2019-07-09 MED FILL — VIT D2 1.25 MG (50,000 UNIT: 1.25 MG | 28 days supply | Qty: 4 | Fill #0

## 2019-07-09 MED FILL — UNIFINE PENTIPS 32GX5/32": 32G X 4 MM | 90 days supply | Qty: 100 | Fill #0

## 2019-07-09 MED FILL — UNIFINE PENTIPS 32GX5/32: 32G X 4 MM | 90 days supply | Qty: 100 | Fill #0

## 2019-07-15 MED FILL — JANUMET XR 50-1,000 MG TAB: 50-1000 | 30 days supply | Qty: 60 | Fill #0

## 2019-07-22 ENCOUNTER — Telehealth (INDEPENDENT_AMBULATORY_CARE_PROVIDER_SITE_OTHER): Payer: Self-pay | Admitting: Bariatrics

## 2019-07-22 DIAGNOSIS — G4733 Obstructive sleep apnea (adult) (pediatric): Secondary | ICD-10-CM | POA: Diagnosis not present

## 2019-07-22 DIAGNOSIS — G4714 Hypersomnia due to medical condition: Secondary | ICD-10-CM | POA: Diagnosis not present

## 2019-07-22 NOTE — Telephone Encounter (Signed)
Pt called concerning the medication being called in that was discussed at her last appt.  She hasn't received this medication yet because it isn't at the pharmacy and is wondering if she needs to reschedule her next appt until she has tried it for some time.

## 2019-07-22 NOTE — Telephone Encounter (Signed)
Pt notified that Laura Valencia was denied because insr requires Contrave to be tried first.  Pt gave verbal understanding that this will be discussed with Dr Owens Shark at her next ov.  Bronte Sabado

## 2019-08-04 ENCOUNTER — Encounter (INDEPENDENT_AMBULATORY_CARE_PROVIDER_SITE_OTHER): Payer: Self-pay

## 2019-08-05 ENCOUNTER — Other Ambulatory Visit: Payer: Self-pay

## 2019-08-05 ENCOUNTER — Encounter (INDEPENDENT_AMBULATORY_CARE_PROVIDER_SITE_OTHER): Payer: Self-pay | Admitting: Bariatrics

## 2019-08-05 ENCOUNTER — Ambulatory Visit (INDEPENDENT_AMBULATORY_CARE_PROVIDER_SITE_OTHER): Payer: 59 | Admitting: Bariatrics

## 2019-08-05 VITALS — BP 116/73 | HR 71 | Temp 98.4°F | Ht 66.0 in | Wt 250.0 lb

## 2019-08-05 DIAGNOSIS — E119 Type 2 diabetes mellitus without complications: Secondary | ICD-10-CM

## 2019-08-05 DIAGNOSIS — Z9189 Other specified personal risk factors, not elsewhere classified: Secondary | ICD-10-CM | POA: Diagnosis not present

## 2019-08-05 DIAGNOSIS — Z6841 Body Mass Index (BMI) 40.0 and over, adult: Secondary | ICD-10-CM | POA: Diagnosis not present

## 2019-08-05 DIAGNOSIS — E559 Vitamin D deficiency, unspecified: Secondary | ICD-10-CM | POA: Diagnosis not present

## 2019-08-05 MED ORDER — VITAMIN D (ERGOCALCIFEROL) 1.25 MG (50000 UNIT) PO CAPS: 50000.0000 [IU] | ORAL_CAPSULE | ORAL | 0 refills | Status: DC

## 2019-08-05 MED ORDER — NALTREXONE-BUPROPION HCL ER 8-90 MG PO TB12: ORAL_TABLET | ORAL | 0 refills | Status: DC

## 2019-08-06 ENCOUNTER — Encounter (INDEPENDENT_AMBULATORY_CARE_PROVIDER_SITE_OTHER): Payer: Self-pay | Admitting: Bariatrics

## 2019-08-06 MED FILL — VIT D2 1.25 MG (50,000 UNIT: 1.25 MG | 84 days supply | Qty: 12 | Fill #0

## 2019-08-06 NOTE — Progress Notes (Signed)
Office: [phone]  /  Fax: [phone]   HPI:   Chief Complaint: OBESITY Laura Valencia is here to discuss her progress with her obesity treatment plan. She is on the Category 3 plan and is following her eating plan approximately 40% of the time. She states she is walking 5,000 steps a day.  Laura Valencia was disappointed not to get the Saxenda. She is down 1 lb. Her weight is 250 lb (113.4 kg) today and has had a weight loss of 1 pound over a period of 4 weeks since her last visit. She has lost 0 lbs since starting treatment with [name].  Diabetes II Laura Valencia has a diagnosis of diabetes type II. Laura Valencia does not report checking her blood sugars. Last A1c was 7.4 on 06/22/2019 and insulin was 13.6. She has been working on intensive lifestyle modifications including diet, exercise, and weight loss to help control her blood glucose levels.  Vitamin D deficiency Laura Valencia has a diagnosis of Vitamin D deficiency. Her last Vitamin D was 26.0 on 06/22/2019. She is currently taking prescription Vit D and denies nausea, vomiting or muscle weakness.  At risk for osteopenia and osteoporosis Laura Valencia is at higher risk of osteopenia and osteoporosis due to Vitamin D deficiency.   ASSESSMENT AND PLAN:  Vitamin D deficiency - Plan: Vitamin D, Ergocalciferol, (DRISDOL) 1.25 MG (50000 UT) CAPS capsule  Type 2 diabetes mellitus without complication, without long-term current use of insulin (HCC)  At risk for osteoporosis  Class 3 severe obesity with serious comorbidity and body mass index (BMI) of 40.0 to 44.9 in adult, unspecified obesity type ([place]) - Plan: Naltrexone-buPROPion HCl ER 8-90 MG TB12  PLAN:  Diabetes II Laura Valencia has been given extensive diabetes education by myself today including ideal fasting and post-prandial blood glucose readings, individual ideal HgA1c goals  and hypoglycemia prevention. We discussed the importance of good blood sugar control to decrease the likelihood of diabetic  complications such as nephropathy, neuropathy, limb loss, blindness, coronary artery disease, and death. We discussed the importance of intensive lifestyle modification including diet, exercise and weight loss as the first line treatment for diabetes. Laura Valencia agrees to continue her diabetes medications and will follow-up at the agreed upon time.  Vitamin D Deficiency Laura Valencia was informed that low Vitamin D levels contributes to fatigue and are associated with obesity, breast, and colon cancer. She agrees to continue to take prescription Vit D @ 50,000 IU every week #12 with 0 refills and will follow-up for routine testing of Vitamin D, at least 2-3 times per year. She was informed of the risk of over-replacement of Vitamin D and agrees to not increase her dose unless she discusses this with [name] first. Laura Valencia agrees to follow-up with our clinic in 6 weeks.  At risk for osteopenia and osteoporosis Laura Valencia was given extended  (15 minutes) osteoporosis prevention counseling today. Laura Valencia is at risk for osteopenia and osteoporosis due to her Vitamin D deficiency. She was encouraged to take her Vitamin D and follow her higher calcium diet and increase strengthening exercise to help strengthen her bones and decrease her risk of osteopenia and osteoporosis.  Obesity Laura Valencia is currently in the action stage of change. As such, her goal is to continue with weight loss efforts. She has agreed to follow the Category 3 plan. Laura Valencia will work on meal planning and intentional eating. Laura Valencia has been instructed to her current exercise regimen for weight loss and overall health benefits. We discussed the following Behavioral Modification Strategies today: increasing lean protein  intake, decreasing simple carbohydrates, increasing vegetables, increase H20 intake, decrease eating out, no skipping meals, work on meal planning and easy cooking plans, and keeping healthy foods in the home.  She will take  Contrave 8/90 (1 AM initially), taper up gradually and start 2 daily BID.  Laura Valencia has agreed to follow-up with our clinic in 6 weeks. She was informed of the importance of frequent follow-up visits to maximize her success with intensive lifestyle modifications for her multiple health conditions.  ALLERGIES: No Known Allergies  MEDICATIONS: Current Outpatient Medications on File Prior to Visit  Medication Sig Dispense Refill  . aspirin 81 MG tablet Take 81 mg by mouth daily.    . blood glucose meter kit and supplies Dispense based on patient and insurance preference. Use up to four times daily as directed. (FOR ICD-10 E10.9, E11.9). 1 each 0  . dipyridamole (PERSANTINE) 25 MG tablet Take 25 mg by mouth 4 (four) times daily.    . fluticasone (FLONASE) 50 MCG/ACT nasal spray Place 1 spray into both nostrils daily. 16 g 1  . Insulin Pen Needle (BD PEN NEEDLE NANO U/F) 32G X 4 MM MISC 1 Device by Does not apply route daily. 100 each 0  . rosuvastatin (CRESTOR) 5 MG tablet Take 1 tablet (5 mg total) by mouth daily. 90 tablet 1  . sitaGLIPtin-metformin (JANUMET) 50-1000 MG tablet Take 1 tablet by mouth 2 (two) times daily with a meal.     No current facility-administered medications on file prior to visit.     PAST MEDICAL HISTORY: Past Medical History:  Diagnosis Date  . Arthritis    knees  . Colon polyp    TYPE UNKNOWN  . Diabetes mellitus, type 2 ([place])   . Hyperthyroidism    HX; RX IN THE PAST  . OSA (obstructive sleep apnea)    [facility] 06-19-2010 AHI 9.3/HR  . Osteoarthritis   . Seasonal rhinitis    MILD  . Sleep apnea    occasional uses CPAP    PAST SURGICAL HISTORY: Past Surgical History:  Procedure Laterality Date  . BREAST LUMPECTOMY     LEFT -BENIGN  . CARPAL TUNNEL RELEASE     BILATERAL  . COLONOSCOPY  11/2010   stark poylps  . TRIGGER FINGER RELEASE Left    middle finger    SOCIAL HISTORY: Social History   Tobacco Use  . Smoking status: Never Smoker  .  Smokeless tobacco: Never Used  Substance Use Topics  . Alcohol use: Yes    Comment: RARE USE OF ETOH  . Drug use: No    FAMILY HISTORY: Family History  Problem Relation Age of Onset  . Colon cancer Mother 28  . Esophageal cancer Neg Hx   . Stomach cancer Neg Hx   . Rectal cancer Neg Hx    ROS: Review of Systems  Gastrointestinal: Negative for nausea and vomiting.  Musculoskeletal:       Negative for muscle weakness.   PHYSICAL EXAM: Blood pressure 116/73, pulse 71, temperature 98.4 F (36.9 C), temperature source Oral, height '5\' 6"'  (1.676 m), weight 250 lb (113.4 kg), last menstrual period 03/19/2012, SpO2 98 %. Body mass index is 40.35 kg/m. Physical Exam Vitals signs reviewed.  Constitutional:      Appearance: Normal appearance. She is obese.  Cardiovascular:     Rate and Rhythm: Normal rate.     Pulses: Normal pulses.  Pulmonary:     Effort: Pulmonary effort is normal.     Breath sounds:  Normal breath sounds.  Musculoskeletal: Normal range of motion.  Skin:    General: Skin is warm and dry.  Neurological:     Mental Status: She is alert and oriented to person, place, and time.  Psychiatric:        Behavior: Behavior normal.   RECENT LABS AND TESTS: BMET    Component Value Date/Time   NA 143 06/22/2019 1220   K 4.2 06/22/2019 1220   CL 104 06/22/2019 1220   CO2 22 06/22/2019 1220   GLUCOSE 114 (H) 06/22/2019 1220   GLUCOSE 122 (H) 03/26/2018 0745   BUN 15 06/22/2019 1220   CREATININE 0.63 06/22/2019 1220   CREATININE 0.81 03/26/2018 0745   CALCIUM 9.4 06/22/2019 1220   GFRNONAA 98 06/22/2019 1220   GFRNONAA 80 03/26/2018 0745   GFRAA 113 06/22/2019 1220   GFRAA 93 03/26/2018 0745   Lab Results  Component Value Date   HGBA1C 7.4 (H) 06/22/2019   HGBA1C 7.1 (H) 01/03/2019   HGBA1C 6.5 (H) 08/01/2018   HGBA1C 6.7 (H) 03/26/2018   HGBA1C 6.5 (H) 11/06/2017   Lab Results  Component Value Date   INSULIN 13.6 06/22/2019   CBC    Component Value  Date/Time   WBC 5.4 10/01/2013 1729   RBC 5.13 (H) 10/01/2013 1729   HGB 13.7 10/01/2013 1729   HCT 42.0 10/01/2013 1729   PLT 274 10/01/2013 1729   MCV 81.9 10/01/2013 1729   MCH 26.7 10/01/2013 1729   MCHC 32.6 10/01/2013 1729   RDW 14.0 10/01/2013 1729   LYMPHSABS 2.6 10/01/2013 1729   MONOABS 0.6 10/01/2013 1729   EOSABS 0.0 10/01/2013 1729   BASOSABS 0.0 10/01/2013 1729   Iron/TIBC/Ferritin/ %Sat No results found for: IRON, TIBC, FERRITIN, IRONPCTSAT Lipid Panel     Component Value Date/Time   CHOL 152 06/22/2019 1220   TRIG 73 06/22/2019 1220   HDL 71 06/22/2019 1220   CHOLHDL 2.1 01/03/2019 1041   CHOLHDL 2.2 03/26/2018 0745   VLDL 15 10/01/2013 1729   LDLCALC 66 06/22/2019 1220   LDLCALC 62 03/26/2018 0745   Hepatic Function Panel     Component Value Date/Time   PROT 7.2 06/22/2019 1220   ALBUMIN 4.6 06/22/2019 1220   AST 21 06/22/2019 1220   ALT 28 06/22/2019 1220   ALKPHOS 77 06/22/2019 1220   BILITOT 0.7 06/22/2019 1220   BILIDIR 0.20 01/03/2019 1041      Component Value Date/Time   TSH 0.817 06/22/2019 1220   TSH 0.85 07/09/2017 0957   TSH 1.02 03/21/2017   Results for CAYLIN, NASS (MRN [id number]) as of 08/06/2019 08:29  Ref. Range 06/22/2019 12:20  Vitamin D, 25-Hydroxy Latest Ref Range: 30.0 - 100.0 ng/mL 26.0 (L)   OBESITY BEHAVIORAL INTERVENTION VISIT  Today's visit was #3   Starting weight: 250 lbs Starting date: 06/22/2019 Today's weight: 250 lbs  Today's date: 08/05/2019 Total lbs lost to date: 0    08/05/2019  Height '5\' 6"'  (1.676 m)  Weight 250 lb (113.4 kg)  BMI (Calculated) 40.37  BLOOD PRESSURE - SYSTOLIC 440  BLOOD PRESSURE - DIASTOLIC 73   Body Fat % 34.7 %   ASK: We discussed the diagnosis of obesity with Laura Valencia today and Laura Valencia agreed to give [name] permission to discuss obesity behavioral modification therapy today.  ASSESS: Laura Valencia has the diagnosis of obesity and her BMI today is 40.4. Laura Valencia is in the  action stage of change.   ADVISE: Laura Valencia was educated on the  multiple health risks of obesity as well as the benefit of weight loss to improve her health. She was advised of the need for long term treatment and the importance of lifestyle modifications to improve her current health and to decrease her risk of future health problems.  AGREE: Multiple dietary modification options and treatment options were discussed and  Laura Valencia agreed to follow the recommendations documented in the above note.  ARRANGE: Laura Valencia was educated on the importance of frequent visits to treat obesity as outlined per CMS and USPSTF guidelines and agreed to schedule her next follow up appointment today.  Migdalia Dk, am acting as Location manager for [employer], DO  I have reviewed the above documentation for accuracy and completeness, and I agree with the above. -[name], DO

## 2019-08-10 ENCOUNTER — Encounter (INDEPENDENT_AMBULATORY_CARE_PROVIDER_SITE_OTHER): Payer: Self-pay

## 2019-08-12 MED FILL — CONTRAVE ER 8-90 MG TABLET: 8-90 | 28 days supply | Qty: 70 | Fill #0

## 2019-08-24 ENCOUNTER — Other Ambulatory Visit: Payer: Self-pay | Admitting: Family Medicine

## 2019-08-24 MED FILL — ROSUVASTATIN CALCIUM 5 MG T: 5 | 90 days supply | Qty: 90 | Fill #0

## 2019-08-24 MED FILL — JANUMET XR 50-1,000 MG TAB: 50-1000 | 30 days supply | Qty: 60 | Fill #0

## 2019-09-16 ENCOUNTER — Ambulatory Visit (INDEPENDENT_AMBULATORY_CARE_PROVIDER_SITE_OTHER): Payer: 59 | Admitting: Bariatrics

## 2019-09-22 ENCOUNTER — Encounter (INDEPENDENT_AMBULATORY_CARE_PROVIDER_SITE_OTHER): Payer: Self-pay

## 2019-09-24 ENCOUNTER — Ambulatory Visit (INDEPENDENT_AMBULATORY_CARE_PROVIDER_SITE_OTHER): Payer: 59 | Admitting: Bariatrics

## 2019-09-24 ENCOUNTER — Other Ambulatory Visit: Payer: Self-pay

## 2019-09-24 ENCOUNTER — Encounter (INDEPENDENT_AMBULATORY_CARE_PROVIDER_SITE_OTHER): Payer: Self-pay | Admitting: Bariatrics

## 2019-09-24 VITALS — BP 103/71 | HR 69 | Temp 97.8°F | Ht 66.0 in | Wt 244.0 lb

## 2019-09-24 DIAGNOSIS — Z9189 Other specified personal risk factors, not elsewhere classified: Secondary | ICD-10-CM | POA: Diagnosis not present

## 2019-09-24 DIAGNOSIS — G4733 Obstructive sleep apnea (adult) (pediatric): Secondary | ICD-10-CM | POA: Diagnosis not present

## 2019-09-24 DIAGNOSIS — Z6839 Body mass index (BMI) 39.0-39.9, adult: Secondary | ICD-10-CM | POA: Diagnosis not present

## 2019-09-24 DIAGNOSIS — E119 Type 2 diabetes mellitus without complications: Secondary | ICD-10-CM

## 2019-09-24 DIAGNOSIS — E559 Vitamin D deficiency, unspecified: Secondary | ICD-10-CM | POA: Diagnosis not present

## 2019-09-24 DIAGNOSIS — Z6841 Body Mass Index (BMI) 40.0 and over, adult: Secondary | ICD-10-CM

## 2019-09-24 MED ORDER — NALTREXONE-BUPROPION HCL ER 8-90 MG PO TB12: ORAL_TABLET | ORAL | 0 refills | Status: DC

## 2019-09-24 MED FILL — CONTRAVE ER 8-90 MG TABLET: 8-90 | 30 days supply | Qty: 120 | Fill #0

## 2019-09-28 ENCOUNTER — Encounter (INDEPENDENT_AMBULATORY_CARE_PROVIDER_SITE_OTHER): Payer: Self-pay | Admitting: Bariatrics

## 2019-09-28 MED FILL — JANUMET XR 50-1,000 MG TAB: 50-1000 | 30 days supply | Qty: 60 | Fill #1

## 2019-09-28 NOTE — Progress Notes (Signed)
Office: [phone]  /  Fax: [phone]   HPI:   Chief Complaint: OBESITY Laura Valencia is here to discuss her progress with her obesity treatment plan. She is on the Category 3 plan and is following her eating plan approximately 35% of the time. She states she is exercising on a stationary bike 36 minutes 4 times per week. Laura Valencia is down 6 lbs. She is taking Contrave without side effects. Her weight is 244 lb (110.7 kg) today and has had a weight loss of 6 pounds over a period of 7 weeks since her last visit. She has lost 6 lbs since starting treatment with Laura Valencia.  Diabetes II Laura Valencia has a diagnosis of diabetes type II. [name] reports no hypoglycemic episodes. Last A1c was 7.4 on 06/22/2019. She has been working on intensive lifestyle modifications including diet, exercise, and weight loss to help control her blood glucose levels.  Vitamin D deficiency Laura Valencia has a diagnosis of Vitamin D deficiency. She denies nausea, vomiting or muscle weakness.  At risk for osteopenia and osteoporosis Laura Valencia is at higher risk of osteopenia and osteoporosis due to Vitamin D deficiency.   ASSESSMENT AND PLAN:  Type 2 diabetes mellitus without complication, without long-term current use of insulin (Laura Valencia) - Plan: Hemoglobin A1c  Vitamin D deficiency - Plan: VITAMIN D 25 Hydroxy (Vit-D Deficiency, Fractures)  At risk for osteoporosis  Class 2 severe obesity with serious comorbidity and body mass index (BMI) of 39.0 to 39.9 in adult, unspecified obesity type (HCC)  Class 3 severe obesity with serious comorbidity and body mass index (BMI) of 40.0 to 44.9 in adult, unspecified obesity type ([place]) - Plan: Naltrexone-buPROPion HCl ER 8-90 MG TB12  PLAN:  Diabetes II [name] has been given extensive diabetes education by myself today including ideal fasting and post-prandial blood glucose readings, individual ideal HgA1c goals  and hypoglycemia prevention. We discussed the importance of good blood  sugar control to decrease the likelihood of diabetic complications such as nephropathy, neuropathy, limb loss, blindness, coronary artery disease, and death. We discussed the importance of intensive lifestyle modification including diet, exercise and weight loss as the first line treatment for diabetes. [name] will get a free set of labs and follow-up at the agreed upon time.  Vitamin D Deficiency Laura Valencia was informed that low Vitamin D levels contributes to fatigue and are associated with obesity, breast, and colon cancer. She will have routine testing of Vitamin D and follow-up with our clinic in 6 weeks.  At risk for osteopenia and osteoporosis Laura Valencia was given extended  (15 minutes) osteoporosis prevention counseling today. She is at risk for osteopenia and osteoporosis due to her Vitamin D deficiency. She was encouraged to take her Vitamin D and follow her higher calcium diet and increase strengthening exercise to help strengthen her bones and decrease her risk of osteopenia and osteoporosis.  Obesity Laura Valencia is currently in the action stage of change. As such, her goal is to continue with weight loss efforts. She has agreed to follow the Category 3 plan. Laura Valencia will work on [facility], intentional eating, meeting calorie and protein goals, and increasing her water intake. She will taper Contrave up to 2 in AM and 1 in PM. Laura Valencia has been instructed to work up to a goal of 150 minutes of combined cardio and strengthening exercise per week for weight loss and overall health benefits. We discussed the following Behavioral Modification Stratagies today: increasing lean protein intake, decreasing simple carbohydrates, increasing vegetables, increase H20 intake, decrease eating out,  no skipping meals, work on meal planning and easy cooking plans, and keeping healthy foods in the home.  Laura Valencia was given a prescription for Contrave 8-90 mg 2 in AM and 2 in PM #120 with 0 refills. She  agrees to follow-up with our clinic in 6 weeks.  Laura Valencia has agreed to follow-up with our clinic in 6 weeks. She was informed of the importance of frequent follow-up visits to maximize her success with intensive lifestyle modifications for her multiple health conditions.  ALLERGIES: No Known Allergies  MEDICATIONS: Current Outpatient Medications on File Prior to Visit  Medication Sig Dispense Refill  . aspirin 81 MG tablet Take 81 mg by mouth daily.    . blood glucose meter kit and supplies Dispense based on patient and insurance preference. Use up to four times daily as directed. (FOR ICD-10 E10.9, E11.9). 1 each 0  . dipyridamole (PERSANTINE) 25 MG tablet Take 25 mg by mouth 4 (four) times daily.    . fluticasone (FLONASE) 50 MCG/ACT nasal spray Place 1 spray into both nostrils daily. 16 g 1  . Insulin Pen Needle (BD PEN NEEDLE NANO U/F) 32G X 4 MM MISC 1 Device by Does not apply route daily. 100 each 0  . JANUMET XR 50-1000 MG TB24 TAKE 1 TABLET BY MOUTH 2 TIMES DAILY. 180 tablet 1  . rosuvastatin (CRESTOR) 5 MG tablet TAKE 1 TABLET BY MOUTH DAILY. 90 tablet 0  . sitaGLIPtin-metformin (JANUMET) 50-1000 MG tablet Take 1 tablet by mouth 2 (two) times daily with a meal.    . Vitamin D, Ergocalciferol, (DRISDOL) 1.25 MG (50000 UT) CAPS capsule Take 1 capsule (50,000 Units total) by mouth every 7 (seven) days. 12 capsule 0   No current facility-administered medications on file prior to visit.     PAST MEDICAL HISTORY: Past Medical History:  Diagnosis Date  . Arthritis    knees  . Colon polyp    TYPE UNKNOWN  . Diabetes mellitus, type 2 ([place])   . Hyperthyroidism    HX; RX IN THE PAST  . OSA (obstructive sleep apnea)    [facility] 06-19-2010 AHI 9.3/HR  . Osteoarthritis   . Seasonal rhinitis    MILD  . Sleep apnea    occasional uses CPAP    PAST SURGICAL HISTORY: Past Surgical History:  Procedure Laterality Date  . BREAST LUMPECTOMY     LEFT -BENIGN  . CARPAL TUNNEL RELEASE      BILATERAL  . COLONOSCOPY  11/2010   stark poylps  . TRIGGER FINGER RELEASE Left    middle finger    SOCIAL HISTORY: Social History   Tobacco Use  . Smoking status: Never Smoker  . Smokeless tobacco: Never Used  Substance Use Topics  . Alcohol use: Yes    Comment: RARE USE OF ETOH  . Drug use: No    FAMILY HISTORY: Family History  Problem Relation Age of Onset  . Colon cancer Mother 68  . Esophageal cancer Neg Hx   . Stomach cancer Neg Hx   . Rectal cancer Neg Hx    ROS: Review of Systems  Gastrointestinal: Negative for nausea and vomiting.  Musculoskeletal:       Negative for muscle weakness.  Endo/Heme/Allergies:       Negative for hypoglycemia.   PHYSICAL EXAM: Blood pressure 103/71, pulse 69, temperature 97.8 F (36.6 C), temperature source Oral, height '5\' 6"'  (1.676 m), weight 244 lb (110.7 kg), last menstrual period 03/19/2012, SpO2 99 %. Body mass index is  39.38 kg/m. Physical Exam Vitals signs reviewed.  Constitutional:      Appearance: Normal appearance. She is obese.  Cardiovascular:     Rate and Rhythm: Normal rate.     Pulses: Normal pulses.  Pulmonary:     Effort: Pulmonary effort is normal.     Breath sounds: Normal breath sounds.  Musculoskeletal: Normal range of motion.  Skin:    General: Skin is warm and dry.  Neurological:     Mental Status: She is alert and oriented to person, place, and time.  Psychiatric:        Behavior: Behavior normal.   RECENT LABS AND TESTS: BMET    Component Value Date/Time   NA 143 06/22/2019 1220   K 4.2 06/22/2019 1220   CL 104 06/22/2019 1220   CO2 22 06/22/2019 1220   GLUCOSE 114 (H) 06/22/2019 1220   GLUCOSE 122 (H) 03/26/2018 0745   BUN 15 06/22/2019 1220   CREATININE 0.63 06/22/2019 1220   CREATININE 0.81 03/26/2018 0745   CALCIUM 9.4 06/22/2019 1220   GFRNONAA 98 06/22/2019 1220   GFRNONAA 80 03/26/2018 0745   GFRAA 113 06/22/2019 1220   GFRAA 93 03/26/2018 0745   Lab Results  Component  Value Date   HGBA1C 7.4 (H) 06/22/2019   HGBA1C 7.1 (H) 01/03/2019   HGBA1C 6.5 (H) 08/01/2018   HGBA1C 6.7 (H) 03/26/2018   HGBA1C 6.5 (H) 11/06/2017   Lab Results  Component Value Date   INSULIN 13.6 06/22/2019   CBC    Component Value Date/Time   WBC 5.4 10/01/2013 1729   RBC 5.13 (H) 10/01/2013 1729   HGB 13.7 10/01/2013 1729   HCT 42.0 10/01/2013 1729   PLT 274 10/01/2013 1729   MCV 81.9 10/01/2013 1729   MCH 26.7 10/01/2013 1729   MCHC 32.6 10/01/2013 1729   RDW 14.0 10/01/2013 1729   LYMPHSABS 2.6 10/01/2013 1729   MONOABS 0.6 10/01/2013 1729   EOSABS 0.0 10/01/2013 1729   BASOSABS 0.0 10/01/2013 1729   Iron/TIBC/Ferritin/ %Sat No results found for: IRON, TIBC, FERRITIN, IRONPCTSAT Lipid Panel     Component Value Date/Time   CHOL 152 06/22/2019 1220   TRIG 73 06/22/2019 1220   HDL 71 06/22/2019 1220   CHOLHDL 2.1 01/03/2019 1041   CHOLHDL 2.2 03/26/2018 0745   VLDL 15 10/01/2013 1729   LDLCALC 66 06/22/2019 1220   LDLCALC 62 03/26/2018 0745   Hepatic Function Panel     Component Value Date/Time   PROT 7.2 06/22/2019 1220   ALBUMIN 4.6 06/22/2019 1220   AST 21 06/22/2019 1220   ALT 28 06/22/2019 1220   ALKPHOS 77 06/22/2019 1220   BILITOT 0.7 06/22/2019 1220   BILIDIR 0.20 01/03/2019 1041      Component Value Date/Time   TSH 0.817 06/22/2019 1220   TSH 0.85 07/09/2017 0957   TSH 1.02 03/21/2017   Results for MACALA, BALDONADO (MRN [id number]) as of 09/28/2019 11:59  Ref. Range 06/22/2019 12:20  Vitamin D, 25-Hydroxy Latest Ref Range: 30.0 - 100.0 ng/mL 26.0 (L)   OBESITY BEHAVIORAL INTERVENTION VISIT  Today's visit was #4  Starting weight: 250 lbs Starting date: 06/22/2019 Today's weight: 244 lbs  Today's date: 09/24/2019 Total lbs lost to date: 6    09/24/2019  Height '5\' 6"'  (1.676 m)  Weight 244 lb (110.7 kg)  BMI (Calculated) 39.4  BLOOD PRESSURE - SYSTOLIC 086  BLOOD PRESSURE - DIASTOLIC 71   Body Fat % 57.8 %  Total Body Water  (  lbs) 97.6 lbs   ASK: We discussed the diagnosis of obesity with Fayrene Helper today and Michaella agreed to give Laura Valencia permission to discuss obesity behavioral modification therapy today.  ASSESS: Fardowsa has the diagnosis of obesity and her BMI today is 39.5. [place] is in the action stage of change.   ADVISE: Ranelle was educated on the multiple health risks of obesity as well as the benefit of weight loss to improve her health. She was advised of the need for long term treatment and the importance of lifestyle modifications to improve her current health and to decrease her risk of future health problems.  AGREE: Multiple dietary modification options and treatment options were discussed and  Johnita agreed to follow the recommendations documented in the above note.  ARRANGE: Shahad was educated on the importance of frequent visits to treat obesity as outlined per CMS and USPSTF guidelines and agreed to schedule her next follow up appointment today.  Migdalia Dk, am acting as Location manager for [employer], DO  I have reviewed the above documentation for accuracy and completeness, and I agree with the above. -[name], DO

## 2019-09-30 DIAGNOSIS — E559 Vitamin D deficiency, unspecified: Secondary | ICD-10-CM | POA: Diagnosis not present

## 2019-09-30 DIAGNOSIS — E119 Type 2 diabetes mellitus without complications: Secondary | ICD-10-CM | POA: Diagnosis not present

## 2019-10-01 LAB — HEMOGLOBIN A1C
Est. average glucose Bld gHb Est-mCnc: 163 mg/dL
Hgb A1c MFr Bld: 7.3 % — ABNORMAL HIGH (ref 4.8–5.6)

## 2019-10-01 LAB — VITAMIN D 25 HYDROXY (VIT D DEFICIENCY, FRACTURES): Vit D, 25-Hydroxy: 34 ng/mL (ref 30.0–100.0)

## 2019-10-12 ENCOUNTER — Other Ambulatory Visit: Payer: Self-pay | Admitting: Family Medicine

## 2019-10-12 MED FILL — RESTASIS 0.05% EYE EMULSION: 0.05 | 90 days supply | Qty: 180 | Fill #1

## 2019-11-04 ENCOUNTER — Encounter (INDEPENDENT_AMBULATORY_CARE_PROVIDER_SITE_OTHER): Payer: Self-pay

## 2019-11-05 ENCOUNTER — Encounter (INDEPENDENT_AMBULATORY_CARE_PROVIDER_SITE_OTHER): Payer: Self-pay | Admitting: Bariatrics

## 2019-11-05 ENCOUNTER — Other Ambulatory Visit: Payer: Self-pay

## 2019-11-05 ENCOUNTER — Ambulatory Visit (INDEPENDENT_AMBULATORY_CARE_PROVIDER_SITE_OTHER): Payer: 59 | Admitting: Bariatrics

## 2019-11-05 VITALS — BP 95/65 | HR 69 | Temp 97.9°F | Ht 66.0 in | Wt 245.0 lb

## 2019-11-05 DIAGNOSIS — E782 Mixed hyperlipidemia: Secondary | ICD-10-CM

## 2019-11-05 DIAGNOSIS — Z6841 Body Mass Index (BMI) 40.0 and over, adult: Secondary | ICD-10-CM | POA: Diagnosis not present

## 2019-11-05 DIAGNOSIS — Z9189 Other specified personal risk factors, not elsewhere classified: Secondary | ICD-10-CM

## 2019-11-05 DIAGNOSIS — E119 Type 2 diabetes mellitus without complications: Secondary | ICD-10-CM | POA: Diagnosis not present

## 2019-11-05 MED ORDER — NALTREXONE-BUPROPION HCL ER 8-90 MG PO TB12: ORAL_TABLET | ORAL | 0 refills | Status: DC

## 2019-11-05 MED FILL — CONTRAVE ER 8-90 MG TABLET: 8-90 | 30 days supply | Qty: 120 | Fill #0

## 2019-11-09 ENCOUNTER — Encounter (INDEPENDENT_AMBULATORY_CARE_PROVIDER_SITE_OTHER): Payer: Self-pay | Admitting: Bariatrics

## 2019-11-09 MED FILL — JANUMET XR 50-1,000 MG TAB: 50-1000 | 30 days supply | Qty: 60 | Fill #2

## 2019-11-09 MED FILL — ROSUVASTATIN CALCIUM 5 MG T: 5 | 90 days supply | Qty: 90 | Fill #0

## 2019-11-09 NOTE — Progress Notes (Signed)
Office: [phone]  /  Fax: [phone]   HPI:   Chief Complaint: OBESITY Laura Valencia is here to discuss her progress with her obesity treatment plan. She is on the Category 3 plan and is following her eating plan approximately 35 % of the time. She states she is bike riding for 10 miles 5-7 times per week. Laura Valencia is doing well with Contrave. She is up 1 lb. She is doing well with exercise. She is going to sleep earlier.  Her weight is 245 lb (111.1 kg) today and has gained 1 lb since her last visit. She has lost 5 lbs since starting treatment with [name].  Diabetes II Laura Valencia has a diagnosis of diabetes type II. Laura Valencia is taking Janumet. She denies hypoglycemia. Last A1c was 7.3. She has been working on intensive lifestyle modifications including diet, exercise, and weight loss to help control her blood glucose levels.  Hyperlipidemia (Mixed) Laura Valencia has hyperlipidemia and has been trying to improve her cholesterol levels with intensive lifestyle modification including a low saturated fat diet, exercise and weight loss. She is stable on Crestor and denies any chest pain, claudication or myalgias.  At risk for cardiovascular disease Laura Valencia is at a higher than average risk for cardiovascular disease due to obesity, diabetes II, and hyperlipidemia. She currently denies any chest pain.  ASSESSMENT AND PLAN:  Type 2 diabetes mellitus without complication, without long-term current use of insulin (HCC)  Mixed hyperlipidemia  At risk for heart disease  Class 3 severe obesity with serious comorbidity and body mass index (BMI) of 40.0 to 44.9 in adult, unspecified obesity type (Laura Valencia) - Plan: Naltrexone-buPROPion HCl ER 8-90 MG TB12  PLAN:  Diabetes II Laura Valencia has been given extensive diabetes education by myself today including ideal fasting and post-prandial blood glucose readings, individual ideal Hgb A1c goals and hypoglycemia prevention. We discussed the importance of good blood  sugar control to decrease the likelihood of diabetic complications such as nephropathy, neuropathy, limb loss, blindness, coronary artery disease, and death. We discussed the importance of intensive lifestyle modification including diet, exercise and weight loss as the first line treatment for diabetes. Laura Valencia agrees to continue her diabetes medications, and she agrees to follow up with our clinic in 4 weeks.  Hyperlipidemia (Mixed) Laura Valencia was informed of the American Heart Association Guidelines emphasizing intensive lifestyle modifications as the first line treatment for hyperlipidemia. We discussed many lifestyle modifications today in depth, and Laura Valencia will continue to work on decreasing saturated fats such as fatty red meat, butter and many fried foods. She will also increase vegetables and lean protein in her diet and continue to work on exercise and weight loss efforts. Laura Valencia agrees to continue her medications, and she agrees to follow up with our clinic in 4 weeks.  Cardiovascular risk counseling Laura Valencia was given extended (15 minutes) coronary artery disease prevention counseling today. She is 57 y.o. female and has risk factors for heart disease including obesity, diabetes II, and hyperlipidemia. We discussed intensive lifestyle modifications today with an emphasis on specific weight loss instructions and strategies. Pt was also informed of the importance of increasing exercise and decreasing saturated fats to help prevent heart disease.  Obesity Laura Valencia is currently in the action stage of change. As such, her goal is to continue with weight loss efforts She has agreed to follow the Category 3 plan Laura Valencia has been instructed to work up to a goal of 150 minutes of combined cardio and strengthening exercise per week or increase current exercise for  weight loss and overall health benefits. We discussed the following Behavioral Modification Strategies today: increasing lean protein  intake, decreasing simple carbohydrates, increasing vegetables, decrease eating out, increase H20 intake, no skipping meals, work on meal planning and easy cooking plans, keeping healthy foods in the home, and planning for success We discussed various medication options to help Laura Valencia with her weight loss efforts and we both agreed to continue Contrave 8-90 mg 2 tablets PO BID #120 with no refills.  Laura Valencia has agreed to follow up with our clinic in 4 weeks. She was informed of the importance of frequent follow up visits to maximize her success with intensive lifestyle modifications for her multiple health conditions.  ALLERGIES: No Known Allergies  MEDICATIONS: Current Outpatient Medications on File Prior to Visit  Medication Sig Dispense Refill  . aspirin 81 MG tablet Take 81 mg by mouth daily.    . blood glucose meter kit and supplies Dispense based on patient and insurance preference. Use up to four times daily as directed. (FOR ICD-10 E10.9, E11.9). 1 each 0  . dipyridamole (PERSANTINE) 25 MG tablet Take 25 mg by mouth 4 (four) times daily.    . fluticasone (FLONASE) 50 MCG/ACT nasal spray Place 1 spray into both nostrils daily. 16 g 1  . Insulin Pen Needle (BD PEN NEEDLE NANO U/F) 32G X 4 MM MISC 1 Device by Does not apply route daily. 100 each 0  . JANUMET XR 50-1000 MG TB24 TAKE 1 TABLET BY MOUTH 2 TIMES DAILY. 180 tablet 1  . rosuvastatin (CRESTOR) 5 MG tablet TAKE 1 TABLET BY MOUTH DAILY. 90 tablet 0  . sitaGLIPtin-metformin (JANUMET) 50-1000 MG tablet Take 1 tablet by mouth 2 (two) times daily with a meal.    . Vitamin D, Ergocalciferol, (DRISDOL) 1.25 MG (50000 UT) CAPS capsule Take 1 capsule (50,000 Units total) by mouth every 7 (seven) days. 12 capsule 0   No current facility-administered medications on file prior to visit.     PAST MEDICAL HISTORY: Past Medical History:  Diagnosis Date  . Arthritis    knees  . Colon polyp    TYPE UNKNOWN  . Diabetes mellitus, type 2  ([place])   . Hyperthyroidism    HX; RX IN THE PAST  . OSA (obstructive sleep apnea)    [facility] 06-19-2010 AHI 9.3/HR  . Osteoarthritis   . Seasonal rhinitis    MILD  . Sleep apnea    occasional uses CPAP    PAST SURGICAL HISTORY: Past Surgical History:  Procedure Laterality Date  . BREAST LUMPECTOMY     LEFT -BENIGN  . CARPAL TUNNEL RELEASE     BILATERAL  . COLONOSCOPY  11/2010   st[facility] poylps  . TRIGGER FINGER RELEASE Left    middle finger    SOCIAL HISTORY: Social History   Tobacco Use  . Smoking status: Never Smoker  . Smokeless tobacco: Never Used  Substance Use Topics  . Alcohol use: Yes    Comment: RARE USE OF ETOH  . Drug use: No    FAMILY HISTORY: Family History  Problem Relation Age of Onset  . Colon cancer Mother 75  . Esophageal cancer Neg Hx   . Stomach cancer Neg Hx   . Rectal cancer Neg Hx     ROS: Review of Systems  Constitutional: Negative for weight loss.  Cardiovascular: Negative for chest pain and claudication.  Musculoskeletal: Negative for myalgias.  Endo/Heme/Allergies:       Negative hypoglycemia    PHYSICAL EXAM:  Blood pressure 95/65, pulse 69, temperature 97.9 F (36.6 C), height '5\' 6"'  (1.676 m), weight 245 lb (111.1 kg), last menstrual period 03/19/2012, SpO2 99 %. Body mass index is 39.54 kg/m. Physical Exam Vitals signs reviewed.  Constitutional:      Appearance: Normal appearance. She is obese.  Cardiovascular:     Rate and Rhythm: Normal rate.     Pulses: Normal pulses.  Pulmonary:     Effort: Pulmonary effort is normal.     Breath sounds: Normal breath sounds.  Musculoskeletal: Normal range of motion.  Skin:    General: Skin is warm and dry.  Neurological:     Mental Status: She is alert and oriented to person, place, and time.  Psychiatric:        Mood and Affect: Mood normal.        Behavior: Behavior normal.     RECENT LABS AND TESTS: BMET    Component Value Date/Time   NA 143 06/22/2019 1220   K 4.2  06/22/2019 1220   CL 104 06/22/2019 1220   CO2 22 06/22/2019 1220   GLUCOSE 114 (H) 06/22/2019 1220   GLUCOSE 122 (H) 03/26/2018 0745   BUN 15 06/22/2019 1220   CREATININE 0.63 06/22/2019 1220   CREATININE 0.81 03/26/2018 0745   CALCIUM 9.4 06/22/2019 1220   GFRNONAA 98 06/22/2019 1220   GFRNONAA 80 03/26/2018 0745   GFRAA 113 06/22/2019 1220   GFRAA 93 03/26/2018 0745   Lab Results  Component Value Date   HGBA1C 7.3 (H) 09/30/2019   HGBA1C 7.4 (H) 06/22/2019   HGBA1C 7.1 (H) 01/03/2019   HGBA1C 6.5 (H) 08/01/2018   HGBA1C 6.7 (H) 03/26/2018   Lab Results  Component Value Date   INSULIN 13.6 06/22/2019   CBC    Component Value Date/Time   WBC 5.4 10/01/2013 1729   RBC 5.13 (H) 10/01/2013 1729   HGB 13.7 10/01/2013 1729   HCT 42.0 10/01/2013 1729   PLT 274 10/01/2013 1729   MCV 81.9 10/01/2013 1729   MCH 26.7 10/01/2013 1729   MCHC 32.6 10/01/2013 1729   RDW 14.0 10/01/2013 1729   LYMPHSABS 2.6 10/01/2013 1729   MONOABS 0.6 10/01/2013 1729   EOSABS 0.0 10/01/2013 1729   BASOSABS 0.0 10/01/2013 1729   Iron/TIBC/Ferritin/ %Sat No results found for: IRON, TIBC, FERRITIN, IRONPCTSAT Lipid Panel     Component Value Date/Time   CHOL 152 06/22/2019 1220   TRIG 73 06/22/2019 1220   HDL 71 06/22/2019 1220   CHOLHDL 2.1 01/03/2019 1041   CHOLHDL 2.2 03/26/2018 0745   VLDL 15 10/01/2013 1729   LDLCALC 66 06/22/2019 1220   LDLCALC 62 03/26/2018 0745   Hepatic Function Panel     Component Value Date/Time   PROT 7.2 06/22/2019 1220   ALBUMIN 4.6 06/22/2019 1220   AST 21 06/22/2019 1220   ALT 28 06/22/2019 1220   ALKPHOS 77 06/22/2019 1220   BILITOT 0.7 06/22/2019 1220   BILIDIR 0.20 01/03/2019 1041      Component Value Date/Time   TSH 0.817 06/22/2019 1220   TSH 0.85 07/09/2017 0957   TSH 1.02 03/21/2017      OBESITY BEHAVIORAL INTERVENTION VISIT  Today's visit was # 5   Starting weight: 250 lbs Starting date: 06/22/2019 Today's weight : 245 lbs   Today's date: 11/05/2019 Total lbs lost to date: 5    ASK: We discussed the diagnosis of obesity with Laura Valencia today and Laura Valencia agreed to give [name] permission to  discuss obesity behavioral modification therapy today.  ASSESS: Annikah has the diagnosis of obesity and her BMI today is 39.56 Pegge is in the action stage of change   ADVISE: Laura Valencia was educated on the multiple health risks of obesity as well as the benefit of weight loss to improve her health. She was advised of the need for long term treatment and the importance of lifestyle modifications to improve her current health and to decrease her risk of future health problems.  AGREE: Multiple dietary modification options and treatment options were discussed and  Laura Valencia agreed to follow the recommendations documented in the above note.  ARRANGE: Renly was educated on the importance of frequent visits to treat obesity as outlined per CMS and USPSTF guidelines and agreed to schedule her next follow up appointment today.  Laura Valencia, am acting as transcriptionist for [employer], DO  I have reviewed the above documentation for accuracy and completeness, and I agree with the above. -[name], DO

## 2019-12-03 ENCOUNTER — Ambulatory Visit (INDEPENDENT_AMBULATORY_CARE_PROVIDER_SITE_OTHER): Payer: 59 | Admitting: Bariatrics

## 2019-12-04 ENCOUNTER — Other Ambulatory Visit: Payer: Self-pay

## 2019-12-04 ENCOUNTER — Ambulatory Visit
Admission: RE | Admit: 2019-12-04 | Discharge: 2019-12-04 | Disposition: A | Discharge status: Home / Self Care | Payer: 59 | Source: Ambulatory Visit | Attending: Family Medicine | Admitting: Family Medicine

## 2019-12-04 ENCOUNTER — Ambulatory Visit: Payer: 59 | Admitting: Family Medicine

## 2019-12-04 DIAGNOSIS — Z1231 Encounter for screening mammogram for malignant neoplasm of breast: Secondary | ICD-10-CM | POA: Diagnosis not present

## 2019-12-08 ENCOUNTER — Other Ambulatory Visit: Payer: Self-pay | Admitting: Family Medicine

## 2019-12-08 DIAGNOSIS — R928 Other abnormal and inconclusive findings on diagnostic imaging of breast: Secondary | ICD-10-CM

## 2019-12-08 DIAGNOSIS — G4733 Obstructive sleep apnea (adult) (pediatric): Secondary | ICD-10-CM | POA: Diagnosis not present

## 2019-12-11 ENCOUNTER — Ambulatory Visit
Admission: RE | Admit: 2019-12-11 | Discharge: 2019-12-11 | Disposition: A | Discharge status: Home / Self Care | Payer: 59 | Source: Ambulatory Visit | Attending: Family Medicine | Admitting: Family Medicine

## 2019-12-11 ENCOUNTER — Encounter: Payer: Self-pay | Admitting: Family Medicine

## 2019-12-11 ENCOUNTER — Ambulatory Visit (INDEPENDENT_AMBULATORY_CARE_PROVIDER_SITE_OTHER): Payer: 59 | Admitting: Family Medicine

## 2019-12-11 ENCOUNTER — Other Ambulatory Visit: Payer: Self-pay

## 2019-12-11 ENCOUNTER — Other Ambulatory Visit: Payer: 59

## 2019-12-11 ENCOUNTER — Other Ambulatory Visit: Payer: Self-pay | Admitting: Family Medicine

## 2019-12-11 DIAGNOSIS — R928 Other abnormal and inconclusive findings on diagnostic imaging of breast: Secondary | ICD-10-CM

## 2019-12-11 DIAGNOSIS — E782 Mixed hyperlipidemia: Secondary | ICD-10-CM | POA: Diagnosis not present

## 2019-12-11 DIAGNOSIS — Z853 Personal history of malignant neoplasm of breast: Secondary | ICD-10-CM | POA: Diagnosis not present

## 2019-12-11 DIAGNOSIS — E119 Type 2 diabetes mellitus without complications: Secondary | ICD-10-CM | POA: Diagnosis not present

## 2019-12-11 DIAGNOSIS — N6324 Unspecified lump in the left breast, lower inner quadrant: Secondary | ICD-10-CM | POA: Diagnosis not present

## 2019-12-11 DIAGNOSIS — N6320 Unspecified lump in the left breast, unspecified quadrant: Secondary | ICD-10-CM

## 2019-12-11 DIAGNOSIS — N6323 Unspecified lump in the left breast, lower outer quadrant: Secondary | ICD-10-CM | POA: Diagnosis not present

## 2019-12-11 MED ORDER — ROSUVASTATIN CALCIUM 5 MG PO TABS: 5.0000 mg | ORAL_TABLET | Freq: Every day | ORAL | 1 refills | Status: DC

## 2019-12-11 MED ORDER — JANUMET XR 50-1000 MG PO TB24: 1.0000 | ORAL_TABLET | Freq: Two times a day (BID) | ORAL | 1 refills | Status: DC

## 2019-12-11 MED ORDER — FLUTICASONE PROPIONATE 50 MCG/ACT NA SUSP: 1.0000 | Freq: Every day | NASAL | 1 refills | Status: DC

## 2019-12-11 MED FILL — FLUTICASONE PROP 50 MCG SPR: 50 | 60 days supply | Qty: 16 | Fill #0

## 2019-12-11 MED FILL — ROSUVASTATIN CALCIUM 5 MG T: 5 | 90 days supply | Qty: 90 | Fill #0 | Status: TO

## 2019-12-11 MED FILL — FLUTICASONE PROP 50 MCG SPR: 50 | 30 days supply | Qty: 16 | Fill #0 | Status: TO

## 2019-12-11 MED FILL — JANUMET XR 50-1,000 MG TAB: 50-1000 | 30 days supply | Qty: 60 | Fill #3

## 2019-12-11 NOTE — Progress Notes (Signed)
   Subjective:    Patient ID: Laura Valencia, female    DOB: [birth year], 58 y.o.   MRN: [id number]  HPI Pt here for 6 month follow up. Pt states she is doing well. Pt had lab work done by GI in a couple months ago. Pt doing well on all meds.   Virtual Visit via Telephone Note  I connected with Laura Valencia on 12/11/19 at  1:40 PM EST by telephone and verified that I am speaking with the correct person using two identifiers.  Location: Patient: home Provider: office   I discussed the limitations, risks, security and privacy concerns of performing an evaluation and management service by telephone and the availability of in person appointments. I also discussed with the patient that there may be a patient responsible charge related to this service. The patient expressed understanding and agreed to proceed.   History of Present Illness:    Observations/Objective:   Assessment and Plan:   Follow Up Instructions:    I discussed the assessment and treatment plan with the patient. The patient was provided an opportunity to ask questions and all were answered. The patient agreed with the plan and demonstrated an understanding of the instructions.   The patient was advised to call back or seek an in-person evaluation if the symptoms worsen or if the condition fails to improve as anticipated.  I provided 77minutes of non-face-to-face time during this encounter.   Vicente Males, LPN  Patient claims compliance with diabetes medication. No obvious side effects. Reports no substantial low sugar spells. Most numbers are generally in good range when checked fasting. Generally does not miss a dose of medication. Watching diabetic diet closely   Testing patient notes ongoing challenges with her weight.  Has been participating in a fairly aggressive counseling program with thus far only minimal to mild results.  Patient is aware she needs to lose substantially more  weight in order to improve her long-term health.  Therefore she will be pursuing bariatric interventions.   Review of Systems No headache no chest pain no shortness of breath    Objective:   Physical Exam Virtual       Assessment & Plan:  Impression type 2 diabetes.  A1c good though not perfect.  Maintain same therapy diet exercise discussed  2.  Hyperlipidemia.  Compliant with meds tolerating well  3.  Morbid obesity.  Patient strongly considering moving towards bariatric intervention.  I think this is a good idea discussed  Medications refilled follow-up 6 months diet exercise discussed

## 2019-12-18 ENCOUNTER — Ambulatory Visit
Admission: RE | Admit: 2019-12-18 | Discharge: 2019-12-18 | Disposition: A | Discharge status: Home / Self Care | Payer: 59 | Source: Ambulatory Visit | Attending: Family Medicine | Admitting: Family Medicine

## 2019-12-18 ENCOUNTER — Other Ambulatory Visit: Payer: Self-pay

## 2019-12-18 DIAGNOSIS — N6320 Unspecified lump in the left breast, unspecified quadrant: Secondary | ICD-10-CM

## 2019-12-18 DIAGNOSIS — N6324 Unspecified lump in the left breast, lower inner quadrant: Secondary | ICD-10-CM | POA: Diagnosis not present

## 2019-12-18 DIAGNOSIS — N649 Disorder of breast, unspecified: Secondary | ICD-10-CM | POA: Diagnosis not present

## 2019-12-18 DIAGNOSIS — N6323 Unspecified lump in the left breast, lower outer quadrant: Secondary | ICD-10-CM | POA: Diagnosis not present

## 2020-01-05 MED FILL — JANUMET XR 50-1,000 MG TAB: 50-1000 | 30 days supply | Qty: 60 | Fill #4

## 2020-01-06 ENCOUNTER — Other Ambulatory Visit: Payer: Self-pay | Admitting: Family Medicine

## 2020-01-06 DIAGNOSIS — N6320 Unspecified lump in the left breast, unspecified quadrant: Secondary | ICD-10-CM

## 2020-01-14 ENCOUNTER — Other Ambulatory Visit: Payer: Self-pay

## 2020-01-14 ENCOUNTER — Ambulatory Visit (INDEPENDENT_AMBULATORY_CARE_PROVIDER_SITE_OTHER): Payer: 59 | Admitting: Bariatrics

## 2020-01-14 ENCOUNTER — Encounter (INDEPENDENT_AMBULATORY_CARE_PROVIDER_SITE_OTHER): Payer: Self-pay | Admitting: Bariatrics

## 2020-01-14 VITALS — BP 123/85 | HR 75 | Temp 97.8°F | Ht 66.0 in | Wt 250.0 lb

## 2020-01-14 DIAGNOSIS — Z6841 Body Mass Index (BMI) 40.0 and over, adult: Secondary | ICD-10-CM

## 2020-01-14 DIAGNOSIS — E782 Mixed hyperlipidemia: Secondary | ICD-10-CM | POA: Diagnosis not present

## 2020-01-14 DIAGNOSIS — E119 Type 2 diabetes mellitus without complications: Secondary | ICD-10-CM | POA: Diagnosis not present

## 2020-01-16 NOTE — Progress Notes (Signed)
Chief Complaint:   OBESITY Laura Valencia is here to discuss her progress with her obesity treatment plan along with follow-up of her obesity related diagnoses. Laura Valencia is on the Category 3 Plan and states she is following her eating plan approximately 0% of the time. Laura Valencia states she is not exercising.   Today's visit was #: 6 Starting weight: 250 lbs Starting date: 06/22/2019 Today's weight: 250 lbs Today's date: 01/14/2020 Total lbs lost to date: 0 Total lbs lost since last in-office visit: 0  Interim History: Laura Valencia is up 5 lbs and has struggled throughout the weight loss process. She is considering weight loss surgery. She has worked on her dietary options for 7 years. She stopped the Contrave.   Subjective:   1. Type 2 diabetes mellitus without complication, without long-term current use of insulin (HCC) Medications reviewed. She is taking Janumet XR.   Lab Results  Component Value Date   HGBA1C 7.3 (H) 09/30/2019   HGBA1C 7.4 (H) 06/22/2019   HGBA1C 7.1 (H) 01/03/2019   Lab Results  Component Value Date   MICROALBUR 0.7 04/14/2016   LDLCALC 66 06/22/2019   CREATININE 0.63 06/22/2019    2. Mixed hyperlipidemia Laura Valencia has hyperlipidemia and has been trying to improve her cholesterol levels with intensive lifestyle modification including a low saturated fat diet, exercise and weight loss. She is taking Crestor.  She denies any chest pain, claudication or myalgias.  Lab Results  Component Value Date   ALT 28 06/22/2019   AST 21 06/22/2019   ALKPHOS 77 06/22/2019   BILITOT 0.7 06/22/2019   Lab Results  Component Value Date   CHOL 152 06/22/2019   HDL 71 06/22/2019   LDLCALC 66 06/22/2019   TRIG 73 06/22/2019   CHOLHDL 2.1 01/03/2019    Assessment/Plan:   1. Type 2 diabetes mellitus without complication, without long-term current use of insulin (HCC) Good blood sugar control is important to decrease the likelihood of diabetic complications such as  nephropathy, neuropathy, limb loss, blindness, coronary artery disease, and death. Intensive lifestyle modification including diet, exercise and weight loss are the first line of treatment for diabetes. She agrees to continue medication and decrease carbohydrates, increase protein and healthy fat.   2. Mixed hyperlipidemia Cardiovascular risk and specific lipid/LDL goals reviewed.  We discussed several lifestyle modifications today and Laura Valencia will continue to work on diet, exercise and weight loss efforts. Orders and follow up as documented in patient record.   Counseling Intensive lifestyle modifications are the first line treatment for this issue. . Dietary changes: Increase soluble fiber. Decrease simple carbohydrates. . Exercise changes: Moderate to vigorous-intensity aerobic activity 150 minutes per week if tolerated. . Lipid-lowering medications: see documented in medical record.  3. Class 3 severe obesity with serious comorbidity and body mass index (BMI) of 40.0 to 44.9 in adult, unspecified obesity type [facility]) Laura Valencia is currently in the action stage of change. As such, her goal is to continue with weight loss efforts. She has agreed to on the Category 3 Plan. Given information on [facility] (phone # and web address). She will stop the Contrave.   Exercise goals: For substantial health benefits, adults should do at least 150 minutes (2 hours and 30 minutes) a week of moderate-intensity, or 75 minutes (1 hour and 15 minutes) a week of vigorous-intensity aerobic physical activity, or an equivalent combination of moderate- and vigorous-intensity aerobic activity. Aerobic activity should be performed in episodes of at least 10 minutes, and preferably, it  should be spread throughout the week. Adults should also include muscle-strengthening activities that involve all major muscle groups on 2 or more days a week.  Behavioral modification strategies: increasing lean protein intake,  decreasing simple carbohydrates, increasing vegetables, increasing water intake, decreasing eating out, no skipping meals and meal planning and cooking strategies.  Laura Valencia has agreed to follow-up with our clinic in 4 weeks. She was informed of the importance of frequent follow-up visits to maximize her success with intensive lifestyle modifications for her multiple health conditions.    Objective:   Blood pressure 123/85, pulse 75, temperature 97.8 F (36.6 C), height 5\' 6"  (1.676 m), weight 250 lb (113.4 kg), last menstrual period 03/19/2012, SpO2 99 %. Body mass index is 40.35 kg/m.  General: Cooperative, alert, well developed, in no acute distress. HEENT: Conjunctivae and lids unremarkable. Cardiovascular: Regular rhythm.  Lungs: Normal work of breathing. Neurologic: No focal deficits.   Lab Results  Component Value Date   CREATININE 0.63 06/22/2019   BUN 15 06/22/2019   NA 143 06/22/2019   K 4.2 06/22/2019   CL 104 06/22/2019   CO2 22 06/22/2019   Lab Results  Component Value Date   ALT 28 06/22/2019   AST 21 06/22/2019   ALKPHOS 77 06/22/2019   BILITOT 0.7 06/22/2019   Lab Results  Component Value Date   HGBA1C 7.3 (H) 09/30/2019   HGBA1C 7.4 (H) 06/22/2019   HGBA1C 7.1 (H) 01/03/2019   HGBA1C 6.5 (H) 08/01/2018   HGBA1C 6.7 (H) 03/26/2018   Lab Results  Component Value Date   INSULIN 13.6 06/22/2019   Lab Results  Component Value Date   TSH 0.817 06/22/2019   Lab Results  Component Value Date   CHOL 152 06/22/2019   HDL 71 06/22/2019   LDLCALC 66 06/22/2019   TRIG 73 06/22/2019   CHOLHDL 2.1 01/03/2019   Lab Results  Component Value Date   WBC 5.4 10/01/2013   HGB 13.7 10/01/2013   HCT 42.0 10/01/2013   MCV 81.9 10/01/2013   PLT 274 10/01/2013    Attestation Statements:   Reviewed by clinician on day of visit: allergies, medications, problem list, medical history, surgical history, family history, social history, and previous encounter  notes.  Time spent on visit including pre-visit chart review and post-visit care was 20 minutes.   I, Renee Ramus, am acting as Location manager for [employer], DO .  I have reviewed the above documentation for accuracy and completeness, and I agree with the above. [name], DO

## 2020-01-19 ENCOUNTER — Encounter (INDEPENDENT_AMBULATORY_CARE_PROVIDER_SITE_OTHER): Payer: Self-pay | Admitting: Bariatrics

## 2020-02-04 ENCOUNTER — Encounter: Payer: Self-pay | Admitting: Family Medicine

## 2020-03-21 MED FILL — RESTASIS 0.05% EYE EMULSION: 0.05 | 90 days supply | Qty: 180 | Fill #2

## 2020-03-28 MED FILL — ROSUVASTATIN CALCIUM 5 MG T: 5 | 90 days supply | Qty: 90 | Fill #0

## 2020-03-28 MED FILL — JANUMET XR 50-1,000 MG TAB: 50-1000 | 30 days supply | Qty: 60 | Fill #5

## 2020-03-31 DIAGNOSIS — Z01419 Encounter for gynecological examination (general) (routine) without abnormal findings: Secondary | ICD-10-CM | POA: Diagnosis not present

## 2020-03-31 DIAGNOSIS — E782 Mixed hyperlipidemia: Secondary | ICD-10-CM | POA: Diagnosis not present

## 2020-03-31 DIAGNOSIS — E119 Type 2 diabetes mellitus without complications: Secondary | ICD-10-CM | POA: Diagnosis not present

## 2020-03-31 DIAGNOSIS — Z800 Family history of malignant neoplasm of digestive organs: Secondary | ICD-10-CM | POA: Diagnosis not present

## 2020-03-31 DIAGNOSIS — Z86010 Personal history of colonic polyps: Secondary | ICD-10-CM | POA: Diagnosis not present

## 2020-04-28 MED FILL — JANUMET XR 50-1,000 MG TAB: 50-1000 | 30 days supply | Qty: 60 | Fill #0

## 2020-05-25 ENCOUNTER — Other Ambulatory Visit: Payer: Self-pay

## 2020-05-25 ENCOUNTER — Telehealth (INDEPENDENT_AMBULATORY_CARE_PROVIDER_SITE_OTHER): Payer: 59 | Admitting: Family Medicine

## 2020-05-25 ENCOUNTER — Telehealth: Payer: Self-pay | Admitting: *Deleted

## 2020-05-25 DIAGNOSIS — E119 Type 2 diabetes mellitus without complications: Secondary | ICD-10-CM

## 2020-05-25 MED ORDER — JANUMET XR 50-1000 MG PO TB24: 1.0000 | ORAL_TABLET | Freq: Two times a day (BID) | ORAL | 1 refills | Status: DC

## 2020-05-25 MED ORDER — FLUTICASONE PROPIONATE 50 MCG/ACT NA SUSP: 1.0000 | Freq: Every day | NASAL | 1 refills | Status: DC

## 2020-05-25 MED ORDER — ROSUVASTATIN CALCIUM 5 MG PO TABS: 5.0000 mg | ORAL_TABLET | Freq: Every day | ORAL | 1 refills | Status: DC

## 2020-05-25 MED FILL — FLUTICASONE PROP 50 MCG SPR: 50 | 60 days supply | Qty: 16 | Fill #0

## 2020-05-25 MED FILL — JANUMET XR 50-1,000 MG TAB: 50-1000 | 30 days supply | Qty: 60 | Fill #0

## 2020-05-25 NOTE — Progress Notes (Signed)
   Subjective:    Patient ID: Laura Valencia, female    DOB: 07/14/1961, 59 y.o.   MRN: [id number]  Diabetes She presents for her follow-up diabetic visit. She has type 2 diabetes mellitus. There are no diabetic associated symptoms. Current diabetic treatment includes diet (Janumet and Janumet XR).    Virtual Visit via Video Note  I connected with Laura Valencia on 05/25/20 at 10:00 AM EDT by a video enabled telemedicine application and verified that I am speaking with the correct person using two identifiers.  Location: Patient: home Provider: office   I discussed the limitations of evaluation and management by telemedicine and the availability of in person appointments. The patient expressed understanding and agreed to proceed.  History of Present Illness:    Observations/Objective:   Assessment and Plan:   Follow Up Instructions:    I discussed the assessment and treatment plan with the patient. The patient was provided an opportunity to ask questions and all were answered. The patient agreed with the plan and demonstrated an understanding of the instructions.   The patient was advised to call back or seek an in-person evaluation if the symptoms worsen or if the condition fails to improve as anticipated.  I provided 20 minutes of non-face-to-face time during this encounter.   Review of Systems No headache, no major weight loss or weight gain, no chest pain no back pain abdominal pain no change in bowel habits complete ROS otherwise negative     Objective:   Physical Exam   virt     Assessment & Plan:  Dragon broke. Pt undergoing substantial stress with daughter facing life threatening cancer, family doc here in town with ongoing challenges from that, overall reports handling things ok. Need A1c await. Has had other blood tests elsewhere. meds refilled for six mo. wght loss ref per pt request. f u si x mo

## 2020-05-25 NOTE — Telephone Encounter (Signed)
[name], Laura Valencia are scheduled for a virtual visit with your provider today.    Just as we do with appointments in the office, we must obtain your consent to participate.  Your consent will be active for this visit and any virtual visit you may have with one of our providers in the next 365 days.    If you have a MyChart account, I can also send a copy of this consent to you electronically.  All virtual visits are billed to your insurance company just like a traditional visit in the office.  As this is a virtual visit, video technology does not allow for your provider to perform a traditional examination.  This may limit your provider's ability to fully assess your condition.  If your provider identifies any concerns that need to be evaluated in person or the need to arrange testing such as labs, EKG, etc, we will make arrangements to do so.    Although advances in technology are sophisticated, we cannot ensure that it will always work on either your end or our end.  If the connection with a video visit is poor, we may have to switch to a telephone visit.  With either a video or telephone visit, we are not always able to ensure that we have a secure connection.   I need to obtain your verbal consent now.   Are you willing to proceed with your visit today?   Laura Valencia has provided verbal consent on 05/25/2020 for a virtual visit (video or telephone).   Patsy Lager, LPN [id number]  [id number] AM

## 2020-06-09 DIAGNOSIS — E119 Type 2 diabetes mellitus without complications: Secondary | ICD-10-CM | POA: Diagnosis not present

## 2020-06-10 LAB — HEMOGLOBIN A1C
Est. average glucose Bld gHb Est-mCnc: 171 mg/dL
Hgb A1c MFr Bld: 7.6 % — ABNORMAL HIGH (ref 4.8–5.6)

## 2020-06-13 MED FILL — ROSUVASTATIN CALCIUM 5 MG T: 5 | 90 days supply | Qty: 90 | Fill #0

## 2020-06-14 ENCOUNTER — Encounter: Payer: Self-pay | Admitting: Family Medicine

## 2020-06-14 MED ORDER — BLOOD GLUCOSE METER KIT: PACK | 0 refills | Status: DC

## 2020-06-14 MED FILL — FREESTYLE LANCETS: 90 days supply | Qty: 100 | Fill #0

## 2020-06-14 MED FILL — FREESTYLE LITE TEST STRIP: 50 days supply | Qty: 50 | Fill #0

## 2020-06-14 NOTE — Telephone Encounter (Signed)
Pls order glucometer, test strips, lancets, alcohol pads.  Thx. Dr. Lovena Le

## 2020-06-16 ENCOUNTER — Other Ambulatory Visit (HOSPITAL_COMMUNITY): Payer: Self-pay | Admitting: Ophthalmology

## 2020-06-16 MED FILL — RESTASIS 0.05% EYE EMULSION: 0.05 | 90 days supply | Qty: 180 | Fill #0

## 2020-06-17 ENCOUNTER — Other Ambulatory Visit: Payer: Self-pay

## 2020-06-17 ENCOUNTER — Ambulatory Visit
Admission: RE | Admit: 2020-06-17 | Discharge: 2020-06-17 | Disposition: A | Discharge status: Home / Self Care | Payer: 59 | Source: Ambulatory Visit | Attending: Family Medicine | Admitting: Family Medicine

## 2020-06-17 DIAGNOSIS — N6489 Other specified disorders of breast: Secondary | ICD-10-CM | POA: Diagnosis not present

## 2020-06-17 DIAGNOSIS — N6320 Unspecified lump in the left breast, unspecified quadrant: Secondary | ICD-10-CM

## 2020-06-17 DIAGNOSIS — R928 Other abnormal and inconclusive findings on diagnostic imaging of breast: Secondary | ICD-10-CM | POA: Diagnosis not present

## 2020-06-20 MED FILL — JANUMET XR 50-1,000 MG TAB: 50-1000 | 30 days supply | Qty: 60 | Fill #1

## 2020-06-29 ENCOUNTER — Other Ambulatory Visit: Payer: Self-pay | Admitting: Family Medicine

## 2020-06-29 DIAGNOSIS — Z1231 Encounter for screening mammogram for malignant neoplasm of breast: Secondary | ICD-10-CM

## 2020-07-07 ENCOUNTER — Ambulatory Visit (INDEPENDENT_AMBULATORY_CARE_PROVIDER_SITE_OTHER): Payer: 59 | Admitting: Bariatrics

## 2020-07-14 MED FILL — JANUMET XR 50-1,000 MG TAB: 50-1000 | 30 days supply | Qty: 60 | Fill #1

## 2020-07-15 DIAGNOSIS — G4733 Obstructive sleep apnea (adult) (pediatric): Secondary | ICD-10-CM | POA: Diagnosis not present

## 2020-07-22 DIAGNOSIS — M170 Bilateral primary osteoarthritis of knee: Secondary | ICD-10-CM | POA: Diagnosis not present

## 2020-08-03 ENCOUNTER — Other Ambulatory Visit: Payer: Self-pay

## 2020-08-03 ENCOUNTER — Encounter: Payer: Self-pay | Admitting: Nutrition

## 2020-08-03 ENCOUNTER — Encounter: Payer: 59 | Attending: Family Medicine | Admitting: Nutrition

## 2020-08-03 DIAGNOSIS — E782 Mixed hyperlipidemia: Secondary | ICD-10-CM | POA: Insufficient documentation

## 2020-08-03 DIAGNOSIS — E119 Type 2 diabetes mellitus without complications: Secondary | ICD-10-CM | POA: Insufficient documentation

## 2020-08-03 NOTE — Patient Instructions (Signed)
  Goals  Follow MY Plate Use [email or web address]-[email or web address]/ to log foods and track exercise. Avoid sweets Eat three meals at times discussed. Don't eat past 7 pm. Use Exercise bike 30 minutes 5 times per week for a minimum of 150 mins of exercise per week. Test blood sugars twice a day; before breakfast and before bed.  Weigh self once a week on the same day with empty bladder in am. Lose 1 lb per week- overall goal is to lose 5-7% of body weight within 6 months or less.

## 2020-08-03 NOTE — Progress Notes (Signed)
First Visit [facility] Employee  [phone] Medical Nutrition Therapy:  Appt start time: 4315 end time:  1215   Assessment:  Primary concerns today: Overweight and Type 2 DM. Lives with her husband. Wants to work on weight loss. She is working with another group for her diabetes. Has been through a lot of stress recently.  Eats three meals a day and occasional snacks. Wants to work on mindful eating. Wiling to use MYfitnesspal to help track foods and provide accountability. Willing to work on increasing exercise. She has some knee issues and can't walk as much as she use to for exercise. Prefers to do the exercise bike now.    Currently on Janument 50/1000 BID. Last A1C 7.6%.  Has lost 6 lbs from January 2021. Motivated to make better choices with eating and being more accountable by tracking her food intake.   Wt Readings from Last 3 Encounters:  01/14/20 250 lb (113.4 kg)  11/05/19 245 lb (111.1 kg)  09/24/19 244 lb (110.7 kg)   Ht Readings from Last 3 Encounters:  01/14/20 5\' 6"  (1.676 m)  11/05/19 5\' 6"  (1.676 m)  09/24/19 5\' 6"  (1.676 m)   There is no height or weight on file to calculate BMI. @BMIFA @ Facility age limit for growth percentiles is 20 years. Facility age limit for growth percentiles is 20 years.  CMP Latest Ref Rng & Units 06/22/2019 01/03/2019 08/01/2018  Glucose 65 - 99 mg/dL 114(H) - 95  BUN 8 - 27 mg/dL 15 - 16  Creatinine 0.57 - 1.00 mg/dL 0.63 - 0.82  Sodium 134 - 144 mmol/L 143 - 142  Potassium 3.5 - 5.2 mmol/L 4.2 - 4.1  Chloride 96 - 106 mmol/L 104 - 105  CO2 20 - 29 mmol/L 22 - 22  Calcium 8.7 - 10.3 mg/dL 9.4 - 9.9  Total Protein 6.0 - 8.5 g/dL 7.2 7.2 -  Total Bilirubin 0.0 - 1.2 mg/dL 0.7 0.7 -  Alkaline Phos 39 - 117 IU/L 77 75 -  AST 0 - 40 IU/L 21 27 -  ALT 0 - 32 IU/L 28 39(H) -   Lab Results  Component Value Date   HGBA1C 7.6 (H) 06/09/2020   Lipid Panel     Component Value Date/Time   CHOL 152 06/22/2019 1220   TRIG 73 06/22/2019 1220   HDL 71  06/22/2019 1220   CHOLHDL 2.1 01/03/2019 1041   CHOLHDL 2.2 03/26/2018 0745   VLDL 15 10/01/2013 1729   LDLCALC 66 06/22/2019 1220   LDLCALC 62 03/26/2018 0745   LABVLDL 15 06/22/2019 1220     Preferred Learning Style:   No preference indicated   Learning Readiness:   Ready  Change in progress   MEDICATIONS:    DIETARY INTAKE:  Eats 3 meals a day usually. Occasionally snacks. Likes chocolate.  Usual physical activity: Will start riding exercise bike.  Estimated energy needs: 1200 calories 135 g carbohydrates 80/90 g protein 33 g fat  Progress Towards Goal(s):  In progress.   Nutritional Diagnosis:  NB-1.1 Food and nutrition-related knowledge deficit As related to Diabetes Type 2 and Obesity.  As evidenced by A1C 7.6% and BMI 40..    Intervention:  Weight loss education, meal planning, reading food labels, portion sizes and benefit of exercise 150 minutes a week.  Goals  Follow MY Plate Use [email or web address] to log foods and track exercise. Avoid sweets Eat three meals at times discussed. Don't eat past 7 pm. Use Exercise bike 30 minutes 5 times per  week for a minimum of 150 mins of exercise per week. Test blood sugars twice a day; before breakfast and before bed.  Weigh self once a week on the same day with empty bladder in am. Lose 1 lb per week- overall goal is to lose 5-7% of body weight within 6 months or less.  Teaching Method Utilized  Visual Auditory Hands on  Handouts given during visit include:  The Plate Method   Emotional Eating.  Meal Plan Card  Weight loss tips   Barriers to learning/adherence to lifestyle change: none  Demonstrated degree of understanding via:  Teach Back   Monitoring/Evaluation:  Dietary intake, exercise,  , and body weight in 1 week.Marland Kitchen

## 2020-08-05 DIAGNOSIS — M1711 Unilateral primary osteoarthritis, right knee: Secondary | ICD-10-CM | POA: Diagnosis not present

## 2020-08-08 ENCOUNTER — Ambulatory Visit: Payer: 59 | Admitting: Nutrition

## 2020-08-10 ENCOUNTER — Ambulatory Visit: Payer: 59 | Admitting: Nutrition

## 2020-08-12 DIAGNOSIS — M1711 Unilateral primary osteoarthritis, right knee: Secondary | ICD-10-CM | POA: Diagnosis not present

## 2020-08-15 ENCOUNTER — Other Ambulatory Visit: Payer: Self-pay

## 2020-08-15 ENCOUNTER — Encounter: Payer: Self-pay | Admitting: Nutrition

## 2020-08-15 ENCOUNTER — Encounter: Payer: 59 | Attending: Family Medicine | Admitting: Nutrition

## 2020-08-15 VITALS — Ht 66.0 in | Wt 245.0 lb

## 2020-08-15 DIAGNOSIS — E119 Type 2 diabetes mellitus without complications: Secondary | ICD-10-CM | POA: Insufficient documentation

## 2020-08-15 DIAGNOSIS — E782 Mixed hyperlipidemia: Secondary | ICD-10-CM | POA: Insufficient documentation

## 2020-08-15 NOTE — Patient Instructions (Addendum)
Goals  Increase exercise as tolerated Try carrots, boiled egg, PB or greek yogurt when get home after work while preparing dinner. Try Myfitness pal for documenting food eaten and track macro/micronutrients Increase water intake to 64 oz per day. Lose 2 lbs per month. Increase high fiber foods to help create fullness and help lower cholesterol. Avoid sweets and desserts

## 2020-08-15 NOTE — Progress Notes (Signed)
First Visit [facility] Employee  [phone] Medical Nutrition Therapy:  Appt start time: 9326 end time:  1215   Assessment:  Primary concerns today: Overweight and Type 2 DM. Lives with her husband.  Things changed: has been using healthy TV dinners for lunch and adding more fruit and veggies for snacks. Working on drinking more water. Drinking about 40 oz per day. Working on stress eating and mindful eating. Got her knee injection, so she feels she can exercise a little more. Gained 1 lb. Admits she needs to track her food better and use [employer]itness [email or web address]    Currently on Janument 50/1000 BID. Last A1C 7.6%.   Motivated to make better choices with eating and being more accountable by tracking her food intake.   Wt Readings from Last 3 Encounters:  08/03/20 244 lb 12.8 oz (111 kg)  01/14/20 250 lb (113.4 kg)  11/05/19 245 lb (111.1 kg)   Ht Readings from Last 3 Encounters:  08/03/20 5\' 5"  (1.651 m)  01/14/20 5\' 6"  (1.676 m)  11/05/19 5\' 6"  (1.676 m)   There is no height or weight on file to calculate BMI. @BMIFA @ Facility age limit for growth percentiles is 20 years. Facility age limit for growth percentiles is 20 years.  CMP Latest Ref Rng & Units 06/22/2019 01/03/2019 08/01/2018  Glucose 65 - 99 mg/dL 114(H) - 95  BUN 8 - 27 mg/dL 15 - 16  Creatinine 0.57 - 1.00 mg/dL 0.63 - 0.82  Sodium 134 - 144 mmol/L 143 - 142  Potassium 3.5 - 5.2 mmol/L 4.2 - 4.1  Chloride 96 - 106 mmol/L 104 - 105  CO2 20 - 29 mmol/L 22 - 22  Calcium 8.7 - 10.3 mg/dL 9.4 - 9.9  Total Protein 6.0 - 8.5 g/dL 7.2 7.2 -  Total Bilirubin 0.0 - 1.2 mg/dL 0.7 0.7 -  Alkaline Phos 39 - 117 IU/L 77 75 -  AST 0 - 40 IU/L 21 27 -  ALT 0 - 32 IU/L 28 39(H) -   Lab Results  Component Value Date   HGBA1C 7.6 (H) 06/09/2020   Lipid Panel     Component Value Date/Time   CHOL 152 06/22/2019 1220   TRIG 73 06/22/2019 1220   HDL 71 06/22/2019 1220   CHOLHDL 2.1 01/03/2019 1041   CHOLHDL 2.2 03/26/2018 0745   VLDL 15  10/01/2013 1729   LDLCALC 66 06/22/2019 1220   LDLCALC 62 03/26/2018 0745   LABVLDL 15 06/22/2019 1220     Preferred Learning Style:   No preference indicated   Learning Readiness:   Ready  Change in progress   MEDICATIONS:    DIETARY INTAKE:  B) Eggs and whole wheat toast with PB or cheese. Lunch: lLean Cuisine, Water D) Beans, rice and vegetables, water  Estimated energy needs: 1200 calories 135 g carbohydrates 80/90 g protein 33 g fat  Progress Towards Goal(s):  In progress.   Nutritional Diagnosis:  NB-1.1 Food and nutrition-related knowledge deficit As related to Diabetes Type 2 and Obesity.  As evidenced by A1C 7.6% and BMI 40..    Intervention:  Weight loss education, meal planning, reading food labels, portion sizes and benefit of exercise 150 minutes a week.  Goals  Increase exercise as tolerated Try carrots, boiled egg, PB or greek yogurt when get home after work while preparing dinner. Try Myfitness pal for documenting food eaten and track macro/micronutrients Increase water intake to 64 oz per day. Lose 2 lbs per month. Increase high fiber foods to  help create fullness and help lower cholesterol. Avoid sweets and desserts  Teaching Method Utilized  Visual Auditory Hands on  Handouts given during visit include:  Way to lose 1 lb per week  Barriers to learning/adherence to lifestyle change: none  Demonstrated degree of understanding via:  Teach Back   Monitoring/Evaluation:  Dietary intake, exercise,  , and body weight in 1 month.

## 2020-08-19 DIAGNOSIS — H5203 Hypermetropia, bilateral: Secondary | ICD-10-CM | POA: Diagnosis not present

## 2020-08-19 DIAGNOSIS — M1711 Unilateral primary osteoarthritis, right knee: Secondary | ICD-10-CM | POA: Diagnosis not present

## 2020-08-19 LAB — HM DIABETES EYE EXAM

## 2020-08-19 MED FILL — JANUMET XR 50-1,000 MG TAB: 50-1000 | 30 days supply | Qty: 60 | Fill #2

## 2020-08-25 ENCOUNTER — Ambulatory Visit: Payer: 59 | Admitting: Nutrition

## 2020-08-25 DIAGNOSIS — D485 Neoplasm of uncertain behavior of skin: Secondary | ICD-10-CM | POA: Diagnosis not present

## 2020-09-02 ENCOUNTER — Encounter: Payer: Self-pay | Admitting: Family Medicine

## 2020-09-02 ENCOUNTER — Other Ambulatory Visit: Payer: Self-pay

## 2020-09-02 ENCOUNTER — Ambulatory Visit: Payer: 59 | Admitting: Family Medicine

## 2020-09-02 VITALS — BP 112/70 | HR 90 | Temp 97.3°F | Ht 63.0 in | Wt 244.0 lb

## 2020-09-02 DIAGNOSIS — E782 Mixed hyperlipidemia: Secondary | ICD-10-CM | POA: Diagnosis not present

## 2020-09-02 DIAGNOSIS — E119 Type 2 diabetes mellitus without complications: Secondary | ICD-10-CM

## 2020-09-02 DIAGNOSIS — Z23 Encounter for immunization: Secondary | ICD-10-CM

## 2020-09-02 LAB — POCT GLYCOSYLATED HEMOGLOBIN (HGB A1C): Hemoglobin A1C: 6.3 % — AB (ref 4.0–5.6)

## 2020-09-02 NOTE — Progress Notes (Signed)
Patient ID: Laura Valencia, female    DOB: 04-Nov-1961, 59 y.o.   MRN: [id number]   Chief Complaint  Patient presents with  . Diabetes   Subjective:    HPI  med check up for DM2.  Would like to get a pneumonia vaccine.  Urine microalbumin.  Dm2- Compliant with medications. Checking blood glucose.   Not seeing any high or low numbers.  Denies polyuria or polydipsia.  Eye exam: this year Foot exam: today  a1c from 7.6 and down to 6.3.  No inc in thirst or urination.  HLD- doing well no new concerns.  Compliant with meds. No chest pain, palpitations, myalgias or joint pains.   Results for orders placed or performed in visit on 09/02/20  POCT glycosylated hemoglobin (Hb A1C)  Result Value Ref Range   Hemoglobin A1C 6.3 (A) 4.0 - 5.6 %   HbA1c POC (<> result, manual entry)     HbA1c, POC (prediabetic range)     HbA1c, POC (controlled diabetic range)       Medical History Laurelle has a past medical history of Arthritis, Colon polyp, Diabetes mellitus, type 2 ([place]), Hyperthyroidism, OSA (obstructive sleep apnea), Osteoarthritis, Seasonal rhinitis, and Sleep apnea.   Outpatient Encounter Medications as of 09/02/2020  Medication Sig  . aspirin 81 MG tablet Take 81 mg by mouth daily.  . blood glucose meter kit and supplies Dispense based on patient and insurance preference. Use to check blood sugar daily . (FOR ICD-10 E10.9, E11.9).  . fluticasone (FLONASE) 50 MCG/ACT nasal spray Place 1 spray into both nostrils daily.  . rosuvastatin (CRESTOR) 5 MG tablet Take 1 tablet (5 mg total) by mouth daily.  . SitaGLIPtin-MetFORMIN HCl (JANUMET XR) 50-1000 MG TB24 Take 1 tablet by mouth 2 (two) times daily.  . [DISCONTINUED] blood glucose meter kit and supplies Dispense based on patient and insurance preference. Use up to four times daily as directed. (FOR ICD-10 E10.9, E11.9).  . [DISCONTINUED] Insulin Pen Needle (BD PEN NEEDLE NANO U/F) 32G X 4 MM MISC 1 Device by Does  not apply route daily.  . [DISCONTINUED] Naltrexone-buPROPion HCl ER 8-90 MG TB12 Take two tabs twice daily  . [DISCONTINUED] sitaGLIPtin-metformin (JANUMET) 50-1000 MG tablet Take 1 tablet by mouth 2 (two) times daily with a meal.  . [DISCONTINUED] Vitamin D, Ergocalciferol, (DRISDOL) 1.25 MG (50000 UT) CAPS capsule Take 1 capsule (50,000 Units total) by mouth every 7 (seven) days.   No facility-administered encounter medications on file as of 09/02/2020.     Review of Systems  Constitutional: Negative for chills and fever.  HENT: Negative for congestion, rhinorrhea and sore throat.   Respiratory: Negative for cough, shortness of breath and wheezing.   Cardiovascular: Negative for chest pain and leg swelling.  Gastrointestinal: Negative for abdominal pain, diarrhea, nausea and vomiting.  Genitourinary: Negative for dysuria and frequency.  Musculoskeletal: Negative for arthralgias and back pain.  Skin: Negative for rash.  Neurological: Negative for dizziness, weakness and headaches.     Vitals BP 112/70   Pulse 90   Temp (!) 97.3 F (36.3 C)   Ht '5\' 3"'  (1.6 m)   Wt 244 lb (110.7 kg)   LMP 03/19/2012   SpO2 99%   BMI 43.22 kg/m   Objective:   Physical Exam Vitals and nursing note reviewed.  Constitutional:      Appearance: Normal appearance.  HENT:     Head: Normocephalic and atraumatic.     Nose: Nose normal.  Mouth/Throat:     Mouth: Mucous membranes are moist.     Pharynx: Oropharynx is clear.  Eyes:     Extraocular Movements: Extraocular movements intact.     Conjunctiva/sclera: Conjunctivae normal.     Pupils: Pupils are equal, round, and reactive to light.  Cardiovascular:     Rate and Rhythm: Normal rate and regular rhythm.     Pulses: Normal pulses.     Heart sounds: Normal heart sounds.  Pulmonary:     Effort: Pulmonary effort is normal.     Breath sounds: Normal breath sounds. No wheezing, rhonchi or rales.  Musculoskeletal:        General: Normal  range of motion.     Right lower leg: No edema.     Left lower leg: No edema.  Feet:     Comments: Normal monofilament test. Skin:    General: Skin is warm and dry.     Findings: No lesion or rash.  Neurological:     General: No focal deficit present.     Mental Status: She is alert and oriented to person, place, and time.  Psychiatric:        Mood and Affect: Mood normal.        Behavior: Behavior normal.     Assessment and Plan   1. Diabetes mellitus without complication ([place]) - POCT glycosylated hemoglobin (Hb A1C) - Microalbumin, urine - CBC; Future - CMP14+EGFR; Future - Hemoglobin A1c; Future - Lipid panel; Future  2. Mixed hyperlipidemia - CMP14+EGFR; Future - Lipid panel; Future  3. Need for vaccination - Pneumococcal polysaccharide vaccine 23-valent greater than or equal to 2yo subcutaneous/IM   DM2- stable a1c at 6.3, improved from 7.6. cont janumet xr. Normal monofilament exam.  Pt had eye exam on 08/19/20. HLD- stable, cont to monitor. Cont crestor 67m  Recheck labs on next visit.  F/u 4 mo or prn.

## 2020-09-07 ENCOUNTER — Other Ambulatory Visit (HOSPITAL_COMMUNITY)
Admission: RE | Admit: 2020-09-07 | Discharge: 2020-09-07 | Disposition: A | Discharge status: Home / Self Care | Payer: 59 | Source: Other Acute Inpatient Hospital | Attending: Family Medicine | Admitting: Family Medicine

## 2020-09-07 DIAGNOSIS — E119 Type 2 diabetes mellitus without complications: Secondary | ICD-10-CM | POA: Diagnosis not present

## 2020-09-08 LAB — MICROALBUMIN, URINE: Microalb, Ur: 7.3 ug/mL — ABNORMAL HIGH

## 2020-09-20 ENCOUNTER — Ambulatory Visit: Payer: 59 | Admitting: Nutrition

## 2020-09-30 ENCOUNTER — Other Ambulatory Visit: Payer: Self-pay | Admitting: *Deleted

## 2020-09-30 MED ORDER — ROSUVASTATIN CALCIUM 5 MG PO TABS: 5.0000 mg | ORAL_TABLET | Freq: Every day | ORAL | 1 refills | Status: DC

## 2020-09-30 MED ORDER — JANUMET XR 50-1000 MG PO TB24: 1.0000 | ORAL_TABLET | Freq: Two times a day (BID) | ORAL | 1 refills | Status: DC

## 2020-09-30 MED FILL — JANUMET XR 50-1,000 MG TAB: 50-1000 | 30 days supply | Qty: 60 | Fill #0

## 2020-09-30 MED FILL — ROSUVASTATIN CALCIUM 5 MG T: 5 | 90 days supply | Qty: 90 | Fill #0

## 2020-10-21 ENCOUNTER — Other Ambulatory Visit (HOSPITAL_COMMUNITY): Payer: Self-pay | Admitting: Ophthalmology

## 2020-10-21 MED FILL — diazePAM 5 MG TABS: 5 | 2 days supply | Qty: 2 | Fill #0

## 2020-10-21 MED FILL — LORazepam 1 MG TABS: 1 | 2 days supply | Qty: 2 | Fill #0

## 2020-10-21 MED FILL — NEO/POLY/DEXAMET EYE OINT: 3.5-10000-0 | 30 days supply | Qty: 4 | Fill #0

## 2020-10-24 IMAGING — MG MM DIGITAL DIAGNOSTIC UNILAT*L* W/ TOMO W/ CAD
4 series · 4 of 12 positions shown · non-contrast
Comparison: Previous exam(s).

CLINICAL DATA: Screening recall for a left breast asymmetry. The
patient has history of an excisional biopsy in the 70s for a left
breast papilloma.

EXAM:
DIGITAL DIAGNOSTIC LEFT MAMMOGRAM WITH CAD AND TOMO
ULTRASOUND LEFT BREAST

[L ML synth-2D]
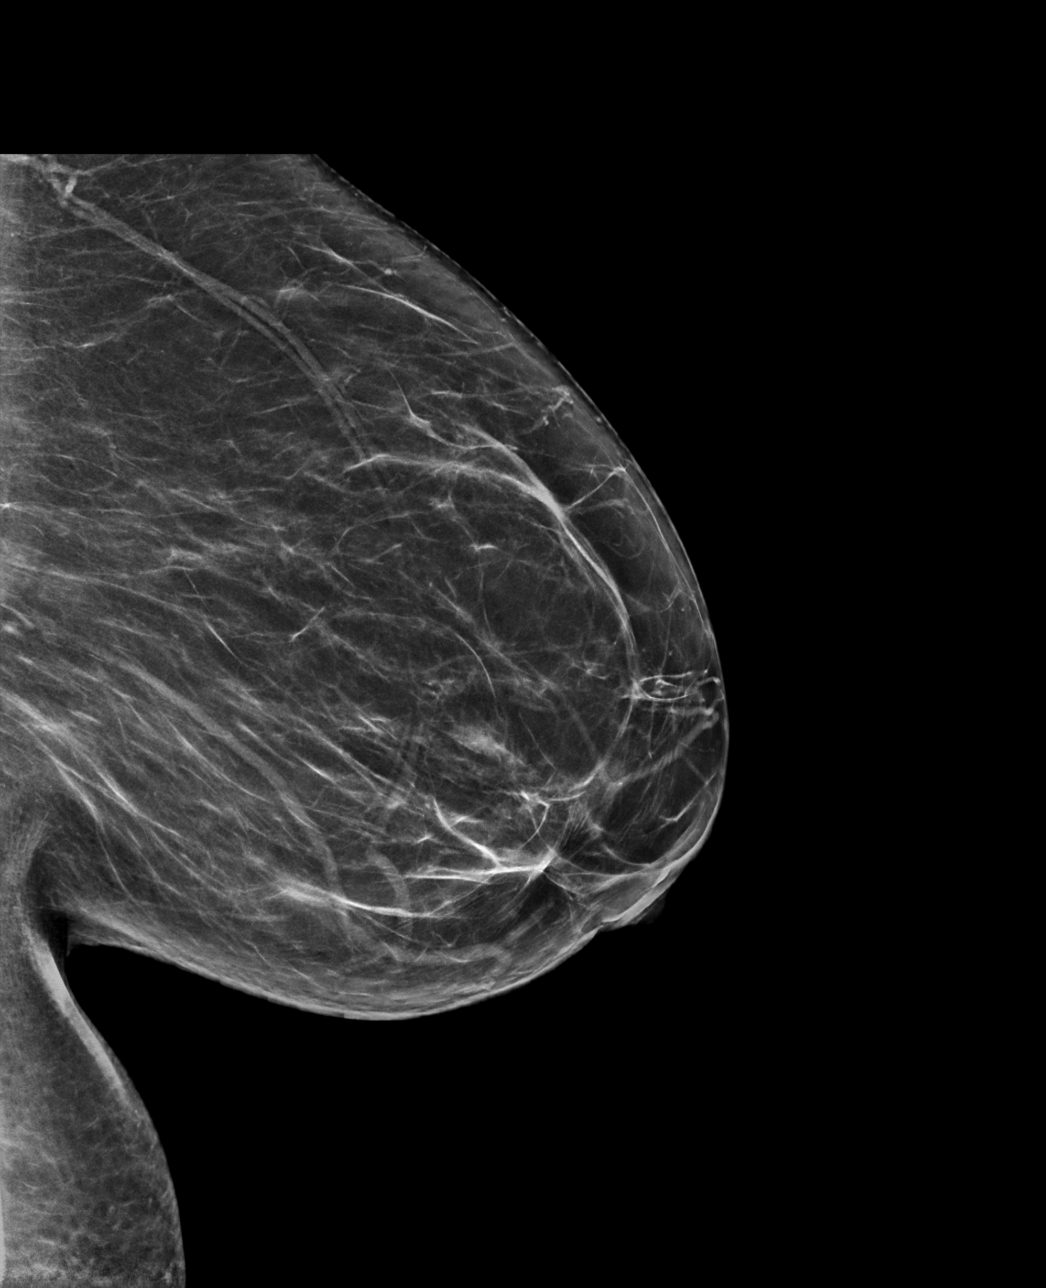

[L CC synth-2D]
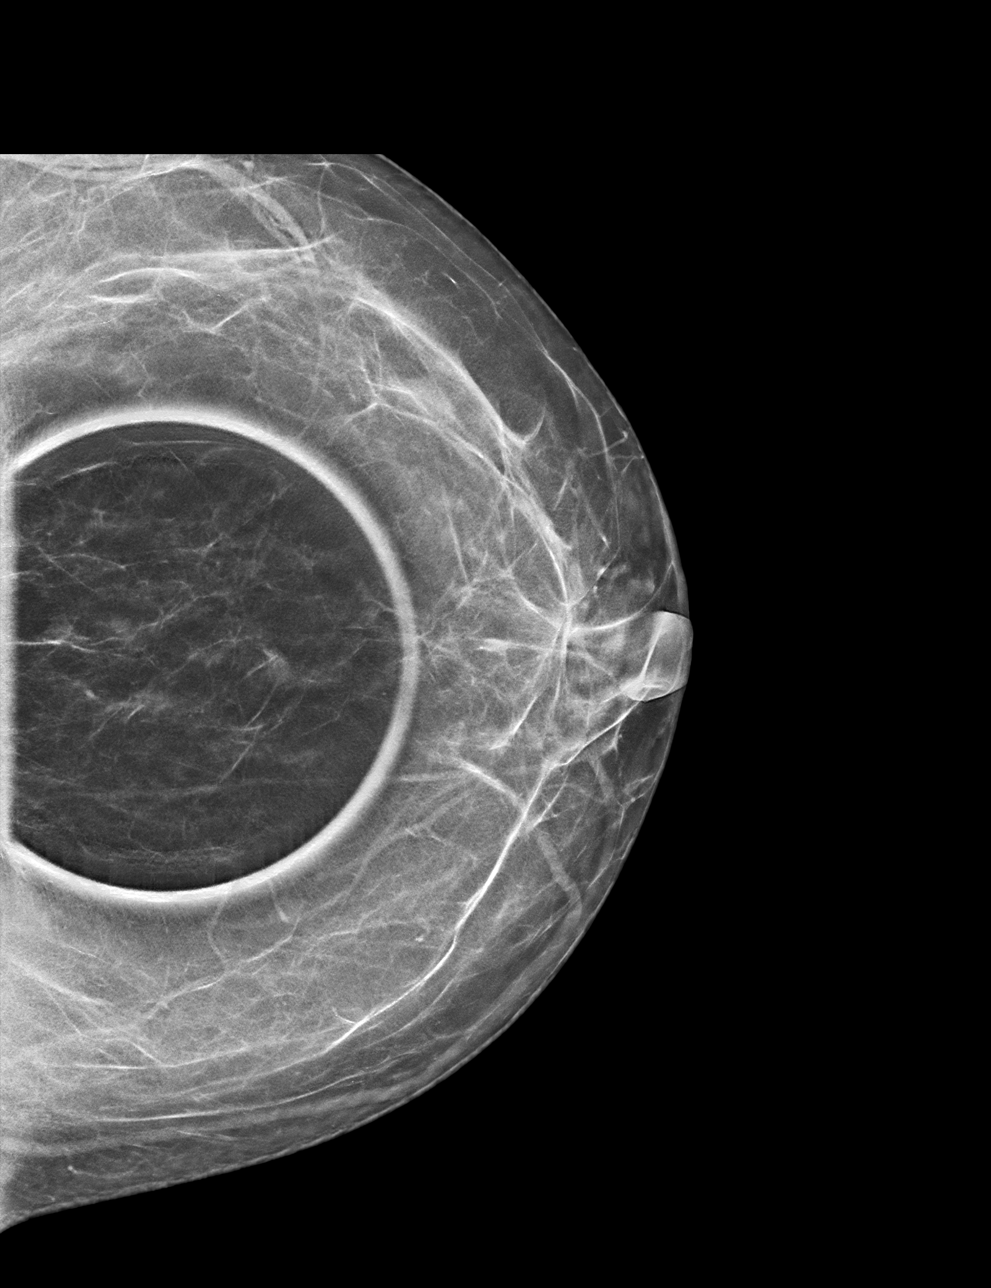

[L ML tomo · tomo slice 37/73.0]
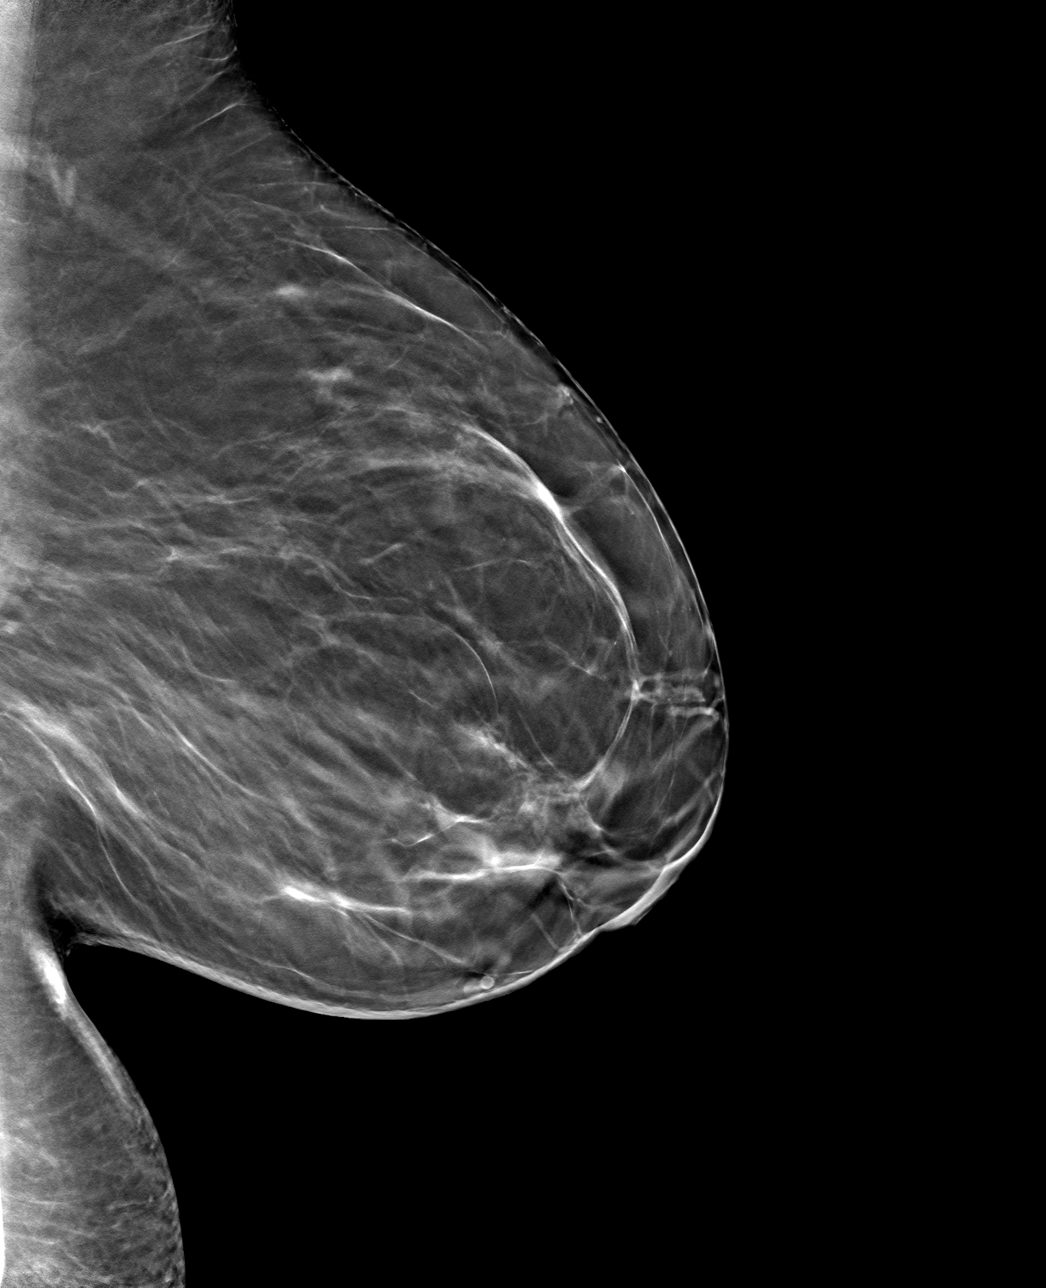

[L CC tomo · tomo slice 26/51.0]
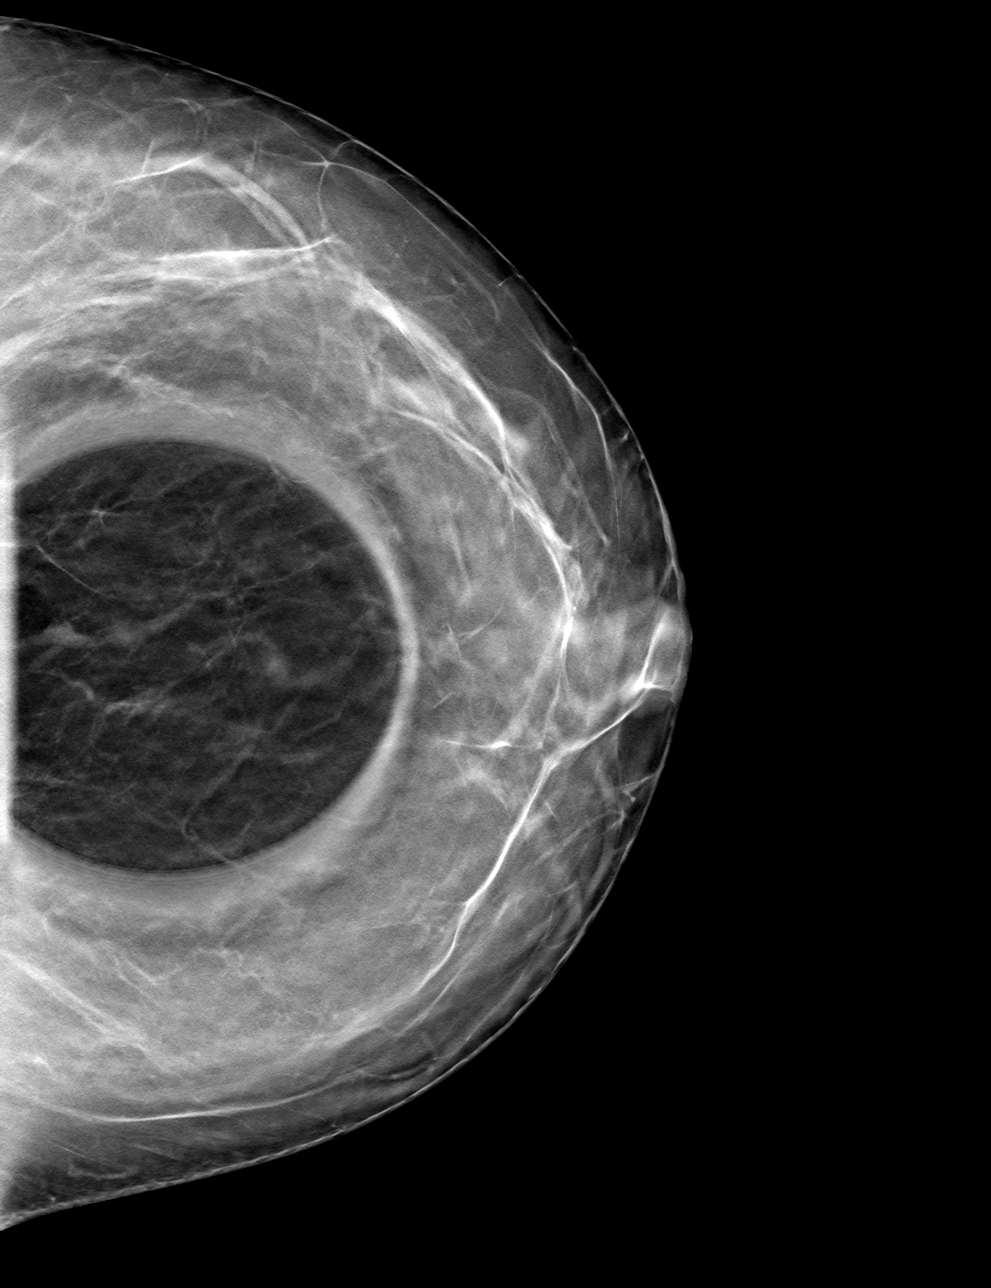

[4 of 12 positions shown; findings below may reference images not displayed]

ACR Breast Density Category b: There are scattered areas of
fibroglandular density.
FINDINGS: Spot compression tomosynthesis images reveal a persistent oval mass
in the inferior central left breast.

Mammographic images were processed with CAD.

Ultrasound of the left breast at 6 o'clock, 4 cm from the nipple
demonstrates a circumscribed bilobed oval mass measuring 7 x 2 x 6
mm. In the radial projection, there is a tiny anechoic pocket along
one margin. This could represent a papillary lesion or a complicated
cyst. Ultrasound of the left axilla demonstrates multiple
normal-appearing lymph nodes.
IMPRESSION: 1. There is a 7 mm mass in the left breast at 6 o'clock, possibly
representing a papilloma or a complicated cyst.

2.  No evidence of left axillary lymphadenopathy.

RECOMMENDATION:
Ultrasound-guided biopsy is recommended, and has been scheduled for

I have discussed the findings and recommendations with the patient.
If applicable, a reminder letter will be sent to the patient
regarding the next appointment.

BI-RADS CATEGORY  4: Suspicious.

## 2020-10-28 DIAGNOSIS — D23122 Other benign neoplasm of skin of left lower eyelid, including canthus: Secondary | ICD-10-CM | POA: Diagnosis not present

## 2020-10-28 DIAGNOSIS — D485 Neoplasm of uncertain behavior of skin: Secondary | ICD-10-CM | POA: Diagnosis not present

## 2020-10-28 DIAGNOSIS — D23112 Other benign neoplasm of skin of right lower eyelid, including canthus: Secondary | ICD-10-CM | POA: Diagnosis not present

## 2020-10-31 IMAGING — US US BREAST BX W LOC DEV 1ST LESION IMG BX SPEC US GUIDE*L*
1 series · 9 of 9 positions shown · non-contrast
Comparison: Previous exam(s).
COMPARISON: Previous exam(s).

Addendum:
CLINICAL DATA: Patient with an indeterminate mass in the lower LEFT
breast presents today for ultrasound-guided core biopsy.

EXAM:
ULTRASOUND GUIDED LEFT BREAST CORE NEEDLE BIOPSY

[Series 1: us breast bx w loc dev 1st lesion img bx spec us g · 0.06mm/px · 9 of 9 slices shown]
[im 1/9]
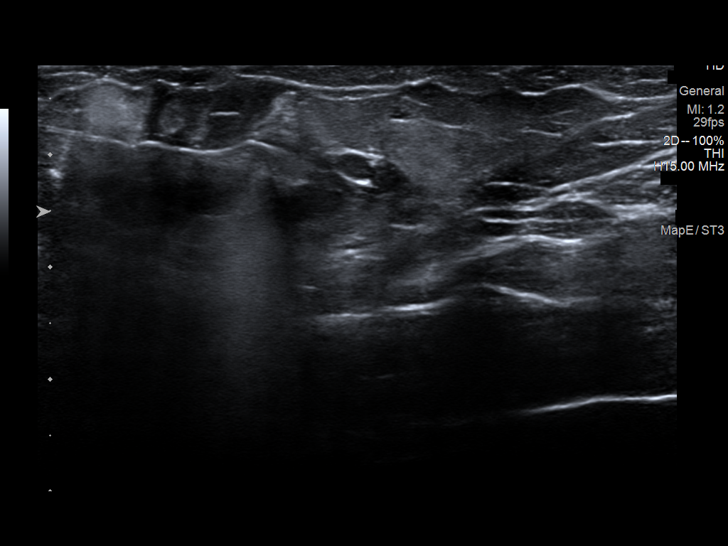
[im 2/9]
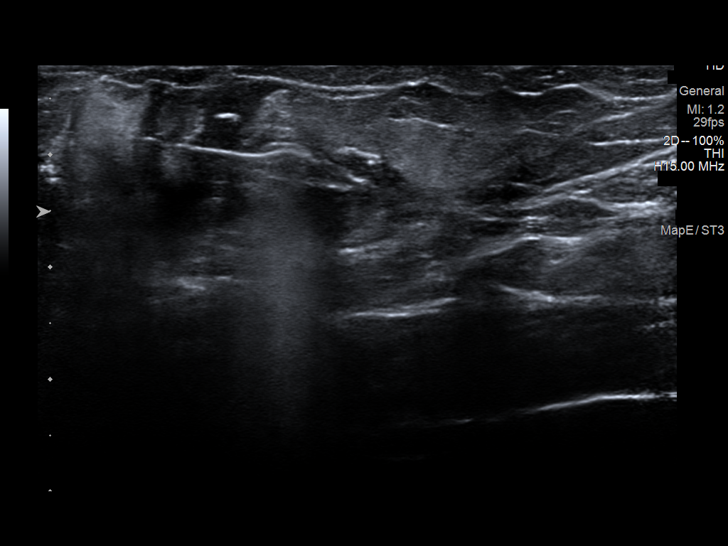
[im 3/9]
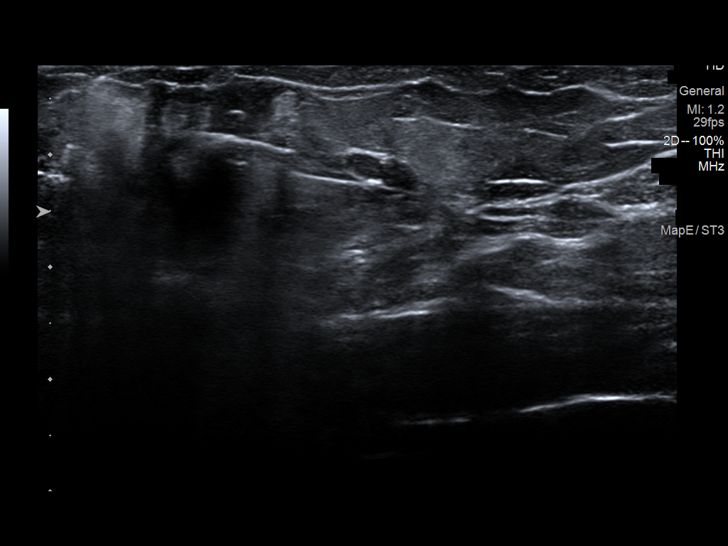
[im 4/9]
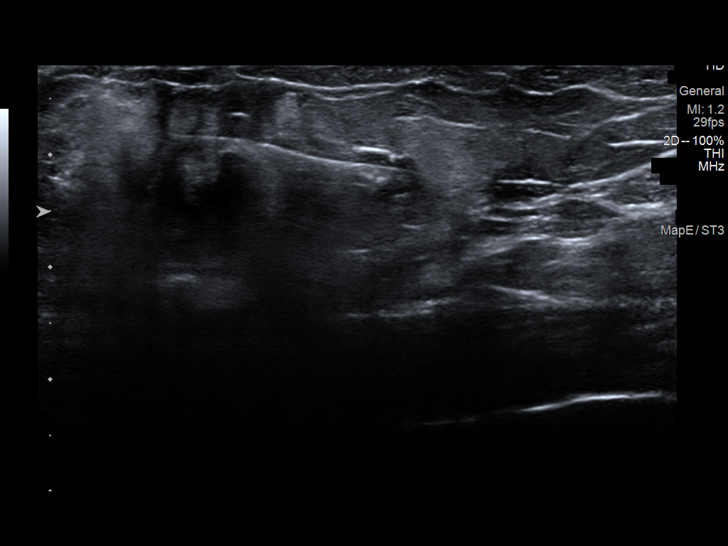
[im 5/9]
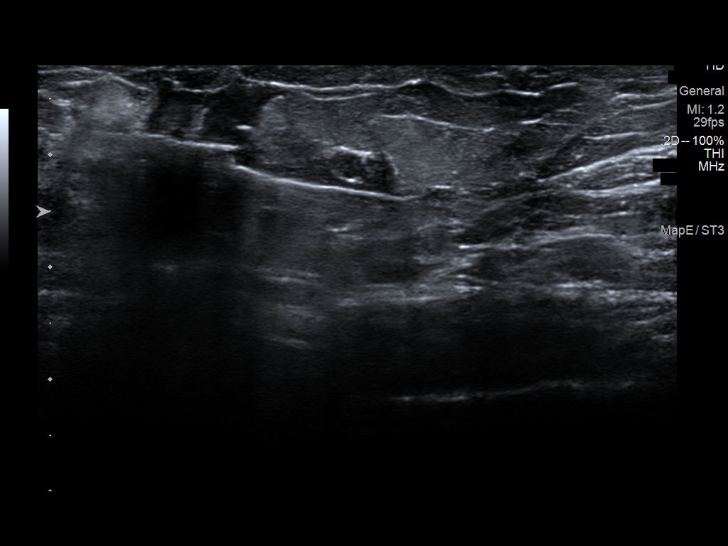
[im 6/9]
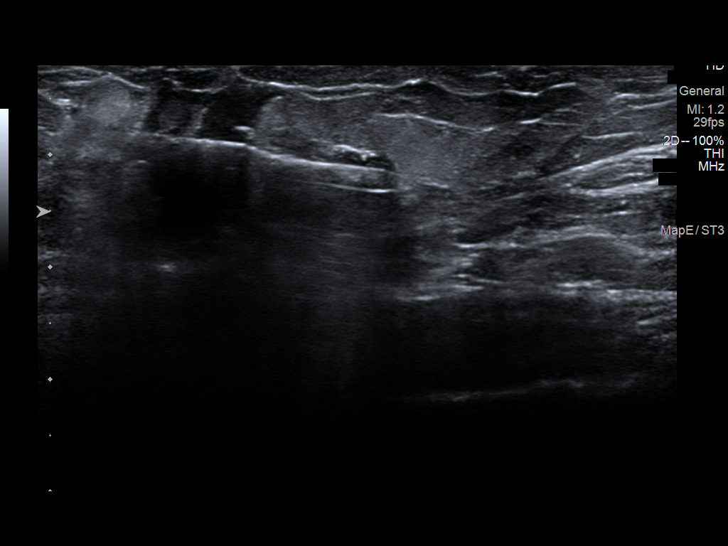
[im 7/9]
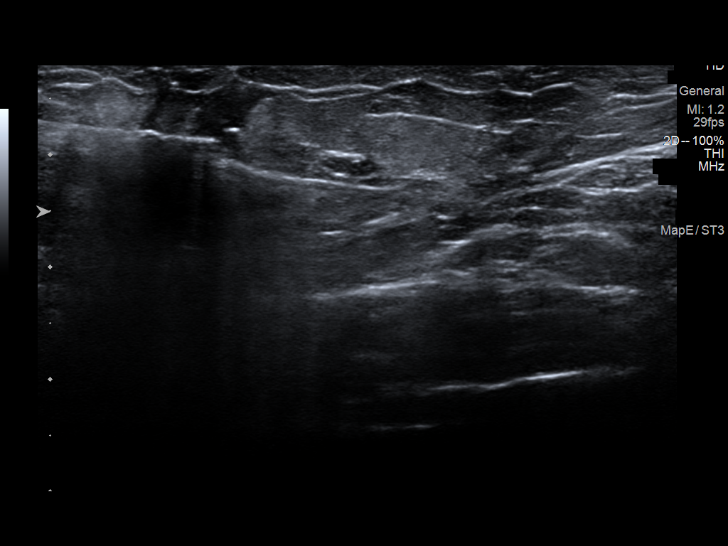
[im 8/9]
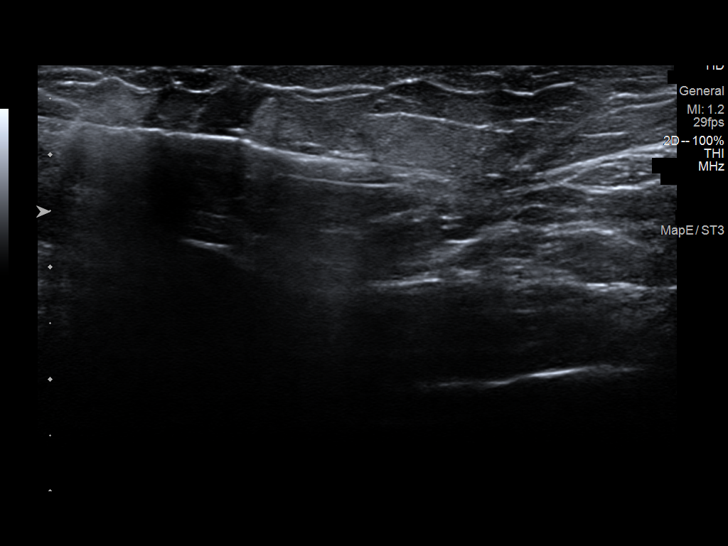
[im 9/9]
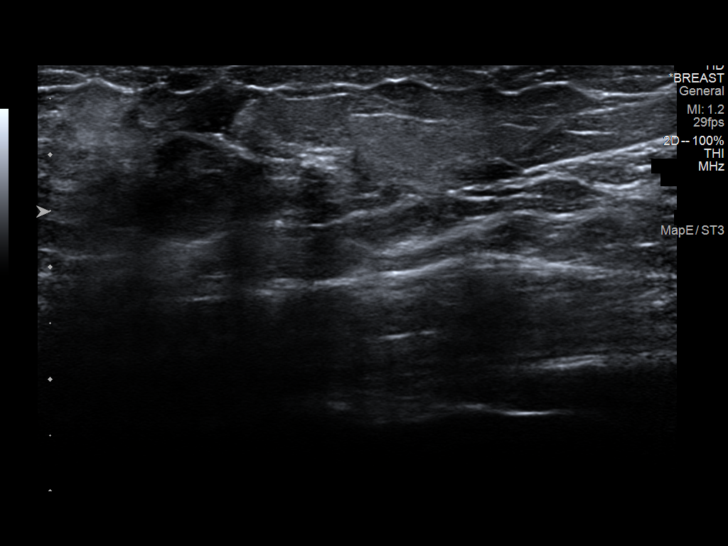

[9 of 9 positions shown; findings below may reference images not displayed]



Lesion quadrant: 6 o'clock

Using sterile technique and 1% Lidocaine as local anesthetic, under
direct ultrasound visualization, a 12 gauge Keyli device was
used to perform biopsy of the LEFT breast mass at the 6 o'clock axis
using a lateral approach. At the conclusion of the procedure ribbon
shaped tissue marker clip was deployed into the biopsy cavity.
Follow up 2 view mammogram was performed and dictated separately.
IMPRESSION: Ultrasound guided biopsy of the LEFT breast mass at 6 o'clock axis.
No apparent complications.

ADDENDUM:
Pathology revealed ADIPOSE TISSUE WITH BLOOD (NO BREAST TISSUE
IDENTIFIED) of the LEFT breast, needle core biopsy, 6 o'clock. This
was found to be concordant by Dr. Shalley Jim.

Pathology results were discussed with the patient by telephone. The
patient reported doing well after the biopsy with tenderness at the
site. Post biopsy instructions and care were reviewed and questions
were answered. The patient was encouraged to call The [REDACTED]

The patient was instructed to return for left diagnostic mammography
and ultrasound in 6 months and informed a reminder notice would be
sent regarding this appointment.

Pathology results reported by Frantz Ritchard Assiel, RN on 12/21/2019.



Lesion quadrant: 6 o'clock

Using sterile technique and 1% Lidocaine as local anesthetic, under
direct ultrasound visualization, a 12 gauge Keyli device was
used to perform biopsy of the LEFT breast mass at the 6 o'clock axis
using a lateral approach. At the conclusion of the procedure ribbon
shaped tissue marker clip was deployed into the biopsy cavity.
Follow up 2 view mammogram was performed and dictated separately.
IMPRESSION: Ultrasound guided biopsy of the LEFT breast mass at 6 o'clock axis.
No apparent complications.

## 2020-11-04 ENCOUNTER — Other Ambulatory Visit: Payer: Self-pay | Admitting: Family Medicine

## 2020-11-04 MED ORDER — METFORMIN HCL 1000 MG PO TABS: 1000.0000 mg | ORAL_TABLET | Freq: Two times a day (BID) | ORAL | 1 refills | Status: DC

## 2020-11-04 MED ORDER — EMPAGLIFLOZIN 10 MG PO TABS: 10.0000 mg | ORAL_TABLET | Freq: Every day | ORAL | 1 refills | Status: DC

## 2020-11-04 MED FILL — METFORMIN HCL 1000 MG TABS: 1000 | 90 days supply | Qty: 180 | Fill #0

## 2020-11-04 MED FILL — JARDIANCE 10 MG TABLET: 10 | 90 days supply | Qty: 90 | Fill #0

## 2020-11-11 DIAGNOSIS — D210 Benign neoplasm of connective and other soft tissue of head, face and neck: Secondary | ICD-10-CM | POA: Diagnosis not present

## 2020-11-29 ENCOUNTER — Encounter (HOSPITAL_COMMUNITY): Payer: Self-pay | Admitting: Physical Therapy

## 2020-11-29 ENCOUNTER — Other Ambulatory Visit: Payer: Self-pay

## 2020-11-29 ENCOUNTER — Ambulatory Visit (HOSPITAL_COMMUNITY): Payer: 59 | Attending: Surgical | Admitting: Physical Therapy

## 2020-11-29 DIAGNOSIS — M25561 Pain in right knee: Secondary | ICD-10-CM | POA: Insufficient documentation

## 2020-11-29 DIAGNOSIS — M25562 Pain in left knee: Secondary | ICD-10-CM | POA: Insufficient documentation

## 2020-11-29 DIAGNOSIS — R2689 Other abnormalities of gait and mobility: Secondary | ICD-10-CM | POA: Insufficient documentation

## 2020-11-30 NOTE — Therapy (Signed)
[place], [place], [address] Phone: [phone]   Fax:  [phone]  Physical Therapy Evaluation  Patient Details  Name: Laura Valencia MRN: [id number] Date of Birth: [birth year] Referring Provider (PT): Merlene Pulling PA-C    Encounter Date: 11/29/2020   PT End of Session - 11/30/20 1820    Visit Number 1    Number of Visits 8    Date for PT Re-Evaluation 12/30/20    Authorization Type [place] [employer]  (no VL, auth req at 25th visit)    PT Start Time 1735    PT Stop Time 1830    PT Time Calculation (min) 55 min    Activity Tolerance Patient tolerated treatment well    Behavior During Therapy [facility] for tasks assessed/performed           Past Medical History:  Diagnosis Date  . Arthritis    knees  . Colon polyp    TYPE UNKNOWN  . Diabetes mellitus, type 2 ([place])   . Hyperthyroidism    HX; RX IN THE PAST  . OSA (obstructive sleep apnea)    [facility] 06-19-2010 AHI 9.3/HR  . Osteoarthritis   . Seasonal rhinitis    MILD  . Sleep apnea    occasional uses CPAP    Past Surgical History:  Procedure Laterality Date  . BREAST EXCISIONAL BIOPSY Left 10+ years ago   intraductal papilloma per pt  . CARPAL TUNNEL RELEASE     BILATERAL  . COLONOSCOPY  11/2010   stark poylps  . TRIGGER FINGER RELEASE Left    middle finger    There were no vitals filed for this visit.    Subjective Assessment - 11/29/20 1742    Subjective Patient presents to physical therapy with complaint of Bilateral knee pain. Has had this issue for several years. Has had xrays which showed degeneration/. Arthritis. Has tries visco injection in RT knee x 3. Says this has not been helpful. Has had steroid injections PRN which is temporary. Also takes NSAIDs and Tylenol when needed.    Limitations Standing;Walking;House hold activities    Diagnostic tests xrays    Patient Stated Goals Improve mobility in knees and reduce pain     Currently in Pain? Yes    Pain Score 8     Pain Location Knee    Pain Orientation Right;Left    Pain Descriptors / Indicators Sharp;Tightness    Pain Type Chronic pain    Pain Onset More than a month ago    Pain Frequency Constant    Aggravating Factors  Weightbearing, bending, stairs    Pain Relieving Factors Meds, rest    Effect of Pain on Daily Activities Limits             11/29/20 0001  Assessment  Medical Diagnosis Bilateral knee pain   Referring Provider (PT) Merlene Pulling PA-C   Prior Therapy No  Precautions  Precautions None  Restrictions  Weight Bearing Restrictions No  Balance Screen  Has the patient fallen in the past 6 months No  [place] residence  Prior Function  Level of Independence Independent  Cognition  Overall Cognitive Status Within Functional Limits for tasks assessed  Observation/Other Assessments  Focus on Therapeutic Outcomes (FOTO)  57% limited   Sensation  Light Touch Appears Intact  ROM / Strength  AROM / PROM / Strength AROM;Strength  AROM  AROM Assessment Site Knee  Right/Left Knee Right;Left  Right Knee Extension 0  Right Knee Flexion 105  Left Knee Extension 0  Left Knee Flexion 105  Strength  Strength Assessment Site Hip;Knee;Ankle  Right/Left Hip Right;Left  Right/Left Knee Right;Left  Right/Left Ankle Right;Left  Right Hip Flexion 4/5  Right Hip Extension 4/5  Right Hip ABduction 4+/5  Left Hip Flexion 4+/5  Left Hip Extension 4-/5  Left Hip ABduction 4+/5  Right Knee Extension 4/5  Left Knee Extension 4+/5  Right Ankle Dorsiflexion 5/5  Left Ankle Dorsiflexion 5/5  Flexibility  Soft Tissue Assessment /Muscle Length  (Mod restriciton in bilateral quad flexibility (RT>LT))  Ambulation/Gait  Ambulation/Gait Yes  Ambulation/Gait Assistance 7: Independent  Ambulation Distance (Feet) 450 Feet  Assistive device None  Gait Pattern Decreased hip/knee flexion - left;Decreased stride  length;Decreased stance time - left;Decreased step length - right;Antalgic;Lateral trunk lean to right  Ambulation Surface Level;Indoor  Gait Comments 2MWT     Objective measurements completed on examination: See above findings.       [facility] Adult PT Treatment/Exercise - 11/30/20 0001      Exercises   Exercises Knee/Hip      Knee/Hip Exercises: Supine   Quad Sets Both;5 reps    Heel Slides Both;5 reps                  PT Education - 11/29/20 1746    Education Details on evaluation findings, POC and HEP    Person(s) Educated Patient    Methods Explanation;Handout    Comprehension Verbalized understanding            PT Short Term Goals - 11/30/20 1826      PT SHORT TERM GOAL #1   Title Patient will be independent with initial HEP and self-management strategies to improve functional outcomes    Time 2    Period Weeks    Status New    Target Date 12/16/20             PT Long Term Goals - 11/30/20 1826      PT LONG TERM GOAL #1   Title Patient will improve FOTO score by 10% to indicate improvement in functional outcomes    Time 4    Period Weeks    Status New    Target Date 12/30/20      PT LONG TERM GOAL #2   Title Patient will have bilateral knee AROM 0-120 degrees to improve functional mobility, normalize ambulation and facilitate squatting to pick up items from floor.    Time 4    Period Weeks    Status New    Target Date 12/30/20      PT LONG TERM GOAL #3   Title Patient will report at least 75% overall improvement in subjective complaint to indicate improvement in ability to perform ADLs.    Time 4    Period Weeks    Status New    Target Date 12/30/20                  Plan - 11/30/20 1822    Clinical Impression Statement Patient is a 59 y.o. female who presents to physical therapy with complaint of bilateral knee pain. Patient demonstrates decreased strength, ROM deficits, flexibility restriction and gait abnormalities which are  likely contributing to symptoms of pain and are negatively impacting patient ability to perform ADLs and functional mobility tasks. Patient will benefit from skilled physical therapy services to address these deficits to reduce pain and improve level of function  with ADLs and functional mobility tasks.    Examination-Activity Limitations Lift;Stand;Locomotion Level;Transfers    Examination-Participation Restrictions Occupation;Community Activity;Cleaning    Stability/Clinical Decision Making Stable/Uncomplicated    Clinical Decision Making Low    Rehab Potential Good    PT Frequency 2x / week    PT Duration 4 weeks    PT Treatment/Interventions ADLs/Self Care Home Management;Aquatic Therapy;Biofeedback;Cryotherapy;Ultrasound;Parrafin;Fluidtherapy;Therapeutic activities;Patient/family education;Manual lymph drainage;Manual techniques;Dry needling;Energy conservation;Splinting;Spinal Manipulations;Visual/perceptual remediation/compensation;Passive range of motion;Scar mobilization;Vasopneumatic Device;Joint Manipulations;Taping;Compression bandaging;Orthotic Fit/Training;Traction;Moist Heat;Functional mobility training;Stair training;Iontophoresis 4mg /ml Dexamethasone;Contrast Bath;Therapeutic exercise;DME Instruction;Balance training;Gait training;Neuromuscular re-education;Electrical Stimulation    PT Next Visit Plan Review goals and HEP. Progress knee strength and mobility as tolerated. Add manual for pain and restriction. Recumbant bike for mobility. Stretch quads    PT Home Exercise Plan Eval: heel slide, quad set    Consulted and Agree with Plan of Care Patient           Patient will benefit from skilled therapeutic intervention in order to improve the following deficits and impairments:  Pain, Improper body mechanics, Increased fascial restricitons, Abnormal gait, Difficulty walking, Impaired flexibility, Decreased strength, Decreased range of motion, Decreased activity tolerance,  Hypomobility  Visit Diagnosis: Right knee pain, unspecified chronicity  Left knee pain, unspecified chronicity  Other abnormalities of gait and mobility     Problem List Patient Active Problem List   Diagnosis Date Noted  . Vitamin D deficiency 04/05/2017  . Mixed hyperlipidemia 04/14/2016  . Morbid obesity ([place]) 04/14/2016  . PERSONAL HISTORY OF COLONIC POLYPS 11/15/2010  . Diabetes mellitus without complication ([place]) 90/30/0923  . Hypersomnia with sleep apnea 06/16/2010    6:36 PM, 11/30/20 Josue Hector PT DPT  Physical Therapist with [facility]  [phone] [address] [place], [place], [address] Phone: [phone]   Fax:  [phone]  Name: Laura Valencia MRN: [id number] Date of Birth: [birth year]

## 2020-12-01 ENCOUNTER — Ambulatory Visit (HOSPITAL_COMMUNITY): Payer: 59 | Admitting: Physical Therapy

## 2020-12-01 ENCOUNTER — Telehealth (HOSPITAL_COMMUNITY): Payer: Self-pay | Admitting: Physical Therapy

## 2020-12-01 MED FILL — RESTASIS 0.05% EYE EMULSION: 0.05 | 90 days supply | Qty: 180 | Fill #0

## 2020-12-01 NOTE — Telephone Encounter (Signed)
pt called to cx this appt due to she has a scheduling conflict at work

## 2020-12-05 ENCOUNTER — Telehealth (HOSPITAL_COMMUNITY): Payer: Self-pay

## 2020-12-05 ENCOUNTER — Other Ambulatory Visit: Payer: Self-pay

## 2020-12-05 ENCOUNTER — Ambulatory Visit (HOSPITAL_COMMUNITY): Payer: 59 | Attending: Surgical | Admitting: Physical Therapy

## 2020-12-05 DIAGNOSIS — M25562 Pain in left knee: Secondary | ICD-10-CM | POA: Diagnosis not present

## 2020-12-05 DIAGNOSIS — M25561 Pain in right knee: Secondary | ICD-10-CM | POA: Insufficient documentation

## 2020-12-05 DIAGNOSIS — R2689 Other abnormalities of gait and mobility: Secondary | ICD-10-CM | POA: Diagnosis not present

## 2020-12-05 NOTE — Telephone Encounter (Signed)
She request apptment today 12/6 due to office meeting tomorrow

## 2020-12-05 NOTE — Patient Instructions (Signed)
Bridge    Lie back, legs bent. Inhale, pressing hips up. Keeping ribs in, lengthen lower back. Exhale, rolling down along spine from top. Repeat __10__ times. Do _2___ sessions per day.   KNEE: Flexion - Prone    Bend knee. Raise heel toward buttocks. Do not raise hips. _10__ reps per set, __2_ sets per day   HIP / KNEE: Extension - Prone    Squeeze glutes. Raise leg up. Keep knee straight. _10__ reps per set, _2__ sets per day   HIP: Abduction - Side-Lying    Lie on side, legs straight and in line with trunk. Squeeze glutes. Raise top leg up and slightly back. Point toes forward. _10__ reps per set, _2__ sets per day   Bend bottom leg to stabilize pelvis   Straight Leg Raise    Tighten stomach and slowly raise locked  Repeat __10__ times per set.  Do __2__ sessions per day.   KNEE: Extension, Long Arc Quads - Sitting    Raise leg until knee is straight.  __10__ reps per set, __2_ sets per day

## 2020-12-05 NOTE — Therapy (Signed)
[place], [place], [address] Phone: [phone]   Fax:  [phone]  Physical Therapy Treatment  Patient Details  Name: Laura Valencia MRN: [id number] Date of Birth: [birth year] Referring Provider (PT): Merlene Pulling PA-C    Encounter Date: 12/05/2020   PT End of Session - 12/05/20 1722    Visit Number 2    Number of Visits 8    Date for PT Re-Evaluation 12/30/20    Authorization Type [place] [employer]  (no VL, auth req at 25th visit)    PT Start Time 1543    PT Stop Time 1636    PT Time Calculation (min) 53 min    Activity Tolerance Patient tolerated treatment well    Behavior During Therapy [facility] for tasks assessed/performed           Past Medical History:  Diagnosis Date  . Arthritis    knees  . Colon polyp    TYPE UNKNOWN  . Diabetes mellitus, type 2 ([place])   . Hyperthyroidism    HX; RX IN THE PAST  . OSA (obstructive sleep apnea)    [facility] 06-19-2010 AHI 9.3/HR  . Osteoarthritis   . Seasonal rhinitis    MILD  . Sleep apnea    occasional uses CPAP    Past Surgical History:  Procedure Laterality Date  . BREAST EXCISIONAL BIOPSY Left 10+ years ago   intraductal papilloma per pt  . CARPAL TUNNEL RELEASE     BILATERAL  . COLONOSCOPY  11/2010   stark poylps  . TRIGGER FINGER RELEASE Left    middle finger    There were no vitals filed for this visit.   Subjective Assessment - 12/05/20 1634    Subjective pt states she is doing well today othe than the knee stiffness.  Reports complaince with HEP.    Currently in Pain? Yes    Pain Score 4     Pain Location Knee    Pain Orientation Right;Left    Pain Descriptors / Indicators Aching;Tightness                             [facility] Adult PT Treatment/Exercise - 12/05/20 0001      Knee/Hip Exercises: Aerobic   Stationary Bike 15 minutes at EOS      Knee/Hip Exercises: Seated   Long Arc Quad 10 reps      Knee/Hip  Exercises: Supine   Quad Sets 10 reps    Heel Slides 10 reps    Bridges 10 reps    Straight Leg Raises 10 reps      Knee/Hip Exercises: Sidelying   Hip ABduction 10 reps      Knee/Hip Exercises: Prone   Hamstring Curl 10 reps    Hip Extension Both;10 reps                    PT Short Term Goals - 12/05/20 1607      PT SHORT TERM GOAL #1   Title Patient will be independent with initial HEP and self-management strategies to improve functional outcomes    Time 2    Period Weeks    Status On-going    Target Date 12/16/20             PT Long Term Goals - 12/05/20 1607      PT LONG TERM GOAL #1   Title Patient will improve FOTO  score by 10% to indicate improvement in functional outcomes    Time 4    Period Weeks    Status On-going      PT LONG TERM GOAL #2   Title Patient will have bilateral knee AROM 0-120 degrees to improve functional mobility, normalize ambulation and facilitate squatting to pick up items from floor.    Time 4    Period Weeks    Status Achieved      PT LONG TERM GOAL #3   Title Patient will report at least 75% overall improvement in subjective complaint to indicate improvement in ability to perform ADLs.    Time 4    Period Weeks    Status Achieved                 Plan - 12/05/20 1725    Clinical Impression Statement Reviewed goals and POC moving forward. Progressed HEP to include exercise to improve hip strength.  Cues needed for general form and alignment with sidelying hip abduction.  Finished session with bike (15 minutes, not billed) Pt reported no issues or increased pain at end of session.  Handouts given with instruction for HEP.    Examination-Activity Limitations Lift;Stand;Locomotion Level;Transfers    Examination-Participation Restrictions Occupation;Community Activity;Cleaning    Stability/Clinical Decision Making Stable/Uncomplicated    Rehab Potential Good    PT Frequency 2x / week    PT Duration 4 weeks    PT  Treatment/Interventions ADLs/Self Care Home Management;Aquatic Therapy;Biofeedback;Cryotherapy;Ultrasound;Parrafin;Fluidtherapy;Therapeutic activities;Patient/family education;Manual lymph drainage;Manual techniques;Dry needling;Energy conservation;Splinting;Spinal Manipulations;Visual/perceptual remediation/compensation;Passive range of motion;Scar mobilization;Vasopneumatic Device;Joint Manipulations;Taping;Compression bandaging;Orthotic Fit/Training;Traction;Moist Heat;Functional mobility training;Stair training;Iontophoresis 4mg /ml Dexamethasone;Contrast Bath;Therapeutic exercise;DME Instruction;Balance training;Gait training;Neuromuscular re-education;Electrical Stimulation    PT Next Visit Plan Progress knee strength and mobility as tolerated. Add manual for pain and restriction if needed.    PT Home Exercise Plan Eval: heel slide, quad set  l2/6: hip abduction, hip extension, SLR, bridge, hamstring curls, seated long arc quads    Consulted and Agree with Plan of Care Patient           Patient will benefit from skilled therapeutic intervention in order to improve the following deficits and impairments:  Pain, Improper body mechanics, Increased fascial restricitons, Abnormal gait, Difficulty walking, Impaired flexibility, Decreased strength, Decreased range of motion, Decreased activity tolerance, Hypomobility  Visit Diagnosis: Right knee pain, unspecified chronicity  Left knee pain, unspecified chronicity  Other abnormalities of gait and mobility     Problem List Patient Active Problem List   Diagnosis Date Noted  . Vitamin D deficiency 04/05/2017  . Mixed hyperlipidemia 04/14/2016  . Morbid obesity ([place]) 04/14/2016  . PERSONAL HISTORY OF COLONIC POLYPS 11/15/2010  . Diabetes mellitus without complication ([place]) 76/81/1572  . Hypersomnia with sleep apnea 06/16/2010   Teena Irani, PTA/CLT [phone]   Teena Irani 12/05/2020, 5:33 PM  [facility] [address] [place], [place], [address] Phone: [phone]   Fax:  [phone]  Name: Laura Valencia MRN: [id number] Date of Birth: 1961/12/11

## 2020-12-06 ENCOUNTER — Ambulatory Visit (HOSPITAL_COMMUNITY): Payer: 59

## 2020-12-08 ENCOUNTER — Other Ambulatory Visit: Payer: Self-pay

## 2020-12-08 ENCOUNTER — Encounter (HOSPITAL_COMMUNITY): Payer: Self-pay

## 2020-12-08 ENCOUNTER — Ambulatory Visit (HOSPITAL_COMMUNITY): Payer: 59

## 2020-12-08 DIAGNOSIS — M25562 Pain in left knee: Secondary | ICD-10-CM | POA: Diagnosis not present

## 2020-12-08 DIAGNOSIS — M25561 Pain in right knee: Secondary | ICD-10-CM

## 2020-12-08 DIAGNOSIS — R2689 Other abnormalities of gait and mobility: Secondary | ICD-10-CM | POA: Diagnosis not present

## 2020-12-08 NOTE — Therapy (Signed)
[place], [place], [address] Phone: [phone]   Fax:  [phone]  Physical Therapy Treatment  Patient Details  Name: Laura Valencia MRN: [id number] Date of Birth: 1961-05-26 Referring Provider (PT): Merlene Pulling PA-C    Encounter Date: 12/08/2020   PT End of Session - 12/08/20 1804    Visit Number 3    Number of Visits 8    Date for PT Re-Evaluation 12/30/20    Authorization Type [place] [employer]  (no VL, auth req at 25th visit)    PT Start Time 1749    PT Stop Time 1830    PT Time Calculation (min) 41 min    Activity Tolerance Patient tolerated treatment well    Behavior During Therapy [facility] for tasks assessed/performed           Past Medical History:  Diagnosis Date  . Arthritis    knees  . Colon polyp    TYPE UNKNOWN  . Diabetes mellitus, type 2 ([place])   . Hyperthyroidism    HX; RX IN THE PAST  . OSA (obstructive sleep apnea)    [facility]H 06-19-2010 AHI 9.3/HR  . Osteoarthritis   . Seasonal rhinitis    MILD  . Sleep apnea    occasional uses CPAP    Past Surgical History:  Procedure Laterality Date  . BREAST EXCISIONAL BIOPSY Left 10+ years ago   intraductal papilloma per pt  . CARPAL TUNNEL RELEASE     BILATERAL  . COLONOSCOPY  11/2010   stark poylps  . TRIGGER FINGER RELEASE Left    middle finger    There were no vitals filed for this visit.   Subjective Assessment - 12/08/20 1751    Subjective Pt stated she feels some pressure while walking, soreness.  Pain scale 3-4/10.   Reports she has completed HEP earlier today, completed them on floor on top of yoga mat with a little difficulty getting off of floor but was able to complete.    Patient Stated Goals Improve mobility in knees and reduce pain    Currently in Pain? Yes    Pain Score 4     Pain Location Knee    Pain Orientation Right;Left    Pain Descriptors / Indicators Aching;Tightness    Pain Type Chronic pain    Pain  Onset More than a month ago    Pain Frequency Constant    Aggravating Factors  Weightbearing, bending, stairs`    Pain Relieving Factors Meds, rest    Effect of Pain on Daily Activities limits                             [facility] Adult PT Treatment/Exercise - 12/08/20 0001      Exercises   Exercises Knee/Hip      Knee/Hip Exercises: Standing   Heel Raises 10 reps    Heel Raises Limitations 5" holds    Rocker Board 2 minutes    Rocker Board Limitations lateral and DF/PF    Gait Training to improve heel to toe mechanics and reduce wobble during gait, cueing for core activaiton, posture and knee mechanics      Knee/Hip Exercises: Seated   Long Arc Quad 10 reps    Sit to [employer] 10 reps;without UE support   eccentric control     Knee/Hip Exercises: Supine   Bridges 10 reps    Bridges Limitations 2 sets 5"  holds      Knee/Hip Exercises: Sidelying   Hip ABduction 10 reps    Hip ABduction Limitations reviewed proper form      Knee/Hip Exercises: Prone   Hip Extension 10 reps                    PT Short Term Goals - 12/05/20 1607      PT SHORT TERM GOAL #1   Title Patient will be independent with initial HEP and self-management strategies to improve functional outcomes    Time 2    Period Weeks    Status On-going    Target Date 12/16/20             PT Long Term Goals - 12/05/20 1607      PT LONG TERM GOAL #1   Title Patient will improve FOTO score by 10% to indicate improvement in functional outcomes    Time 4    Period Weeks    Status On-going      PT LONG TERM GOAL #2   Title Patient will have bilateral knee AROM 0-120 degrees to improve functional mobility, normalize ambulation and facilitate squatting to pick up items from floor.    Time 4    Period Weeks    Status Achieved      PT LONG TERM GOAL #3   Title Patient will report at least 75% overall improvement in subjective complaint to indicate improvement in ability to perform ADLs.     Time 4    Period Weeks    Status Achieved                 Plan - 12/08/20 1824    Clinical Impression Statement Pt entered dept with antalgic gait mechanics, began session with rockerboard and gait training to improve hip stability and ankle mechanics with reports of pain reduced following instruction.  Reviewed form per request with current HEP mat activities with min cueing for proper mechanics with sidelying abduction and position with prone exercies, verbalized understanding and demonstrated appropriately.  Added heel/toe mechanics for strengthening to improve gait mechanics and STS for functional fuctional strengthening with reports of pain reduced following therex.  Additional exercises added to HEP.    Examination-Activity Limitations Lift;Stand;Locomotion Level;Transfers    Examination-Participation Restrictions Occupation;Community Activity;Cleaning    Stability/Clinical Decision Making Stable/Uncomplicated    Clinical Decision Making Low    Rehab Potential Good    PT Frequency 2x / week    PT Duration 4 weeks    PT Treatment/Interventions ADLs/Self Care Home Management;Aquatic Therapy;Biofeedback;Cryotherapy;Ultrasound;Parrafin;Fluidtherapy;Therapeutic activities;Patient/family education;Manual lymph drainage;Manual techniques;Dry needling;Energy conservation;Splinting;Spinal Manipulations;Visual/perceptual remediation/compensation;Passive range of motion;Scar mobilization;Vasopneumatic Device;Joint Manipulations;Taping;Compression bandaging;Orthotic Fit/Training;Traction;Moist Heat;Functional mobility training;Stair training;Iontophoresis 4mg /ml Dexamethasone;Contrast Bath;Therapeutic exercise;DME Instruction;Balance training;Gait training;Neuromuscular re-education;Electrical Stimulation    PT Next Visit Plan Next session add standing hip abd/extension, continue gait training.  Progress knee strength and mobility as tolerated. Add manual for pain and restriction if needed.     PT Home Exercise Plan Eval: heel slide, quad set  l2/6: hip abduction, hip extension, SLR, bridge, hamstring curls, seated long arc quads           Patient will benefit from skilled therapeutic intervention in order to improve the following deficits and impairments:  Pain,Improper body mechanics,Increased fascial restricitons,Abnormal gait,Difficulty walking,Impaired flexibility,Decreased strength,Decreased range of motion,Decreased activity tolerance,Hypomobility  Visit Diagnosis: Right knee pain, unspecified chronicity  Left knee pain, unspecified chronicity  Other abnormalities of gait and mobility     Problem List Patient  Active Problem List   Diagnosis Date Noted  . Vitamin D deficiency 04/05/2017  . Mixed hyperlipidemia 04/14/2016  . Morbid obesity ([place]) 04/14/2016  . PERSONAL HISTORY OF COLONIC POLYPS 11/15/2010  . Diabetes mellitus without complication ([place]) 03/11/8117  . Hypersomnia with sleep apnea 06/16/2010   Ihor Austin, LPTA/CLT; CBIS [phone]  Aldona Lento 12/08/2020, 6:36 PM  [facility] [address] [place], [place], [address] Phone: [phone]   Fax:  [phone]  Name: Laura Valencia MRN: [id number] Date of Birth: 1961-11-30

## 2020-12-08 NOTE — Patient Instructions (Signed)
Toe / Heel Raise (Standing)    Standing with support, raise heels, then rock back on heels and raise toes. Repeat 15 times.  Copyright  VHI. All rights reserved.   Functional Quadriceps: Sit to Stand    Sit on edge of chair, feet flat on floor. Stand upright, extending knees fully. Repeat 10 times per set. Do 2 sets per day. http://orth.exer.us/735   Copyright  VHI. All rights reserved.

## 2020-12-09 ENCOUNTER — Ambulatory Visit: Payer: 59

## 2020-12-13 ENCOUNTER — Encounter (HOSPITAL_COMMUNITY): Payer: Self-pay | Admitting: Physical Therapy

## 2020-12-13 ENCOUNTER — Ambulatory Visit (HOSPITAL_COMMUNITY): Payer: 59 | Admitting: Physical Therapy

## 2020-12-13 ENCOUNTER — Other Ambulatory Visit: Payer: Self-pay

## 2020-12-13 DIAGNOSIS — M25561 Pain in right knee: Secondary | ICD-10-CM

## 2020-12-13 DIAGNOSIS — M25562 Pain in left knee: Secondary | ICD-10-CM

## 2020-12-13 DIAGNOSIS — R2689 Other abnormalities of gait and mobility: Secondary | ICD-10-CM

## 2020-12-13 NOTE — Patient Instructions (Signed)
Access Code: [email or web address] URL: [email or web address] Date: 12/13/2020 Prepared by: Josue Hector  Exercises Standing Heel Raise with Support - 2 x daily - 7 x weekly - 2 sets - 10 reps Standing Toe Raises at Chair - 2 x daily - 7 x weekly - 2 sets - 10 reps Sit to Stand without Arm Support - 2 x daily - 7 x weekly - 2 sets - 10 reps

## 2020-12-14 NOTE — Therapy (Signed)
[place], [place], [address] Phone: [phone]   Fax:  [phone]  Physical Therapy Treatment  Patient Details  Name: Jackquelyn Sundberg MRN: [id number] Date of Birth: 1961/08/14 Referring Provider (PT): Merlene Pulling PA-C    Encounter Date: 12/13/2020   PT End of Session - 12/13/20 1741    Visit Number 4    Number of Visits 8    Date for PT Re-Evaluation 12/30/20    Authorization Type [place] [employer]  (no VL, auth req at 25th visit)    PT Start Time 1735    PT Stop Time 1820    PT Time Calculation (min) 45 min    Activity Tolerance Patient tolerated treatment well;Patient limited by pain    Behavior During Therapy [facility] for tasks assessed/performed           Past Medical History:  Diagnosis Date   Arthritis    knees   Colon polyp    TYPE UNKNOWN   Diabetes mellitus, type 2 ([place])    Hyperthyroidism    HX; RX IN THE PAST   OSA (obstructive sleep apnea)    NPSH 06-19-2010 AHI 9.3/HR   Osteoarthritis    Seasonal rhinitis    MILD   Sleep apnea    occasional uses CPAP    Past Surgical History:  Procedure Laterality Date   BREAST EXCISIONAL BIOPSY Left 10+ years ago   intraductal papilloma per pt   CARPAL TUNNEL RELEASE     BILATERAL   COLONOSCOPY  11/2010   [facility]   TRIGGER FINGER RELEASE Left    middle finger    There were no vitals filed for this visit.   Subjective Assessment - 12/13/20 1741    Subjective Patient says she was not able to do exercises the other day because she was busy. She says she knows she needs to do the exercises and can tell she feels better when she does them.    Limitations Standing;Walking;House hold activities    Patient Stated Goals Improve mobility in knees and reduce pain    Currently in Pain? Yes    Pain Score 3     Pain Location Knee    Pain Orientation Right;Left    Pain Descriptors / Indicators Aching;Tightness    Pain Type  Chronic pain    Pain Onset More than a month ago    Pain Frequency Constant               [facility] Adult PT Treatment/Exercise - 12/13/20 0001      Knee/Hip Exercises: Stretches   Passive Hamstring Stretch Both;5 reps;10 seconds    Passive Hamstring Stretch Limitations on 12 inch step    Knee: Self-Stretch to increase Flexion Both;5 reps;10 seconds    Knee: Self-Stretch Limitations on 12 inch step    Gastroc Stretch Both;3 reps;30 seconds    Gastroc Stretch Limitations slant board      Knee/Hip Exercises: Standing   Heel Raises Both;20 reps    Heel Raises Limitations toe raise x 20    Gait Training 2 x 100" in clinic for improved heel strike and heel to toe transition      Knee/Hip Exercises: Seated   Sit to Sand without UE support;Other (comment)   2x 10 then 2x 10 with RTB around thighs                   PT Short Term Goals - 12/05/20 1607  PT SHORT TERM GOAL #1   Title Patient will be independent with initial HEP and self-management strategies to improve functional outcomes    Time 2    Period Weeks    Status On-going    Target Date 12/16/20             PT Long Term Goals - 12/05/20 1607      PT LONG TERM GOAL #1   Title Patient will improve FOTO score by 10% to indicate improvement in functional outcomes    Time 4    Period Weeks    Status On-going      PT LONG TERM GOAL #2   Title Patient will have bilateral knee AROM 0-120 degrees to improve functional mobility, normalize ambulation and facilitate squatting to pick up items from floor.    Time 4    Period Weeks    Status Achieved      PT LONG TERM GOAL #3   Title Patient will report at least 75% overall improvement in subjective complaint to indicate improvement in ability to perform ADLs.    Time 4    Period Weeks    Status Achieved                 Plan - 12/14/20 1813    Clinical Impression Statement Patient somewhat pain limited with activity today. Progressed LE  strengthening to include standing heel and toe raise, as well as banded sit to stands. Noted patient initiates standing with lumbar instead of glutes. Discussed these findings and added red theraband above knees to facilitate glute activation and reduce knee valgus. Patient continues to ambulate with foot flat and antalgic mechanics. Is able to improve form with verbal cues, but continued to report RT knee pain at end of session. Educated patient on and issued updated HEP handout for exercise she is able to do on the go, or at work, to improve compliance with HEP. Patient will continue to benefit from skilled therapy services to progress knee strength and mobility to reduce pain and improve LOF with ADLs.    Examination-Activity Limitations Lift;Stand;Locomotion Level;Transfers    Examination-Participation Restrictions Occupation;Community Activity;Cleaning    Stability/Clinical Decision Making Stable/Uncomplicated    Rehab Potential Good    PT Frequency 2x / week    PT Duration 4 weeks    PT Treatment/Interventions ADLs/Self Care Home Management;Aquatic Therapy;Biofeedback;Cryotherapy;Ultrasound;Parrafin;Fluidtherapy;Therapeutic activities;Patient/family education;Manual lymph drainage;Manual techniques;Dry needling;Energy conservation;Splinting;Spinal Manipulations;Visual/perceptual remediation/compensation;Passive range of motion;Scar mobilization;Vasopneumatic Device;Joint Manipulations;Taping;Compression bandaging;Orthotic Fit/Training;Traction;Moist Heat;Functional mobility training;Stair training;Iontophoresis 4mg /ml Dexamethasone;Contrast Bath;Therapeutic exercise;DME Instruction;Balance training;Gait training;Neuromuscular re-education;Electrical Stimulation    PT Next Visit Plan Continue gait training. Work on [employer] and activation. Progress knee strength and mobility as tolerated. Add manual for pain and restriction if needed.    PT Home Exercise Plan Eval: heel slide, quad set  l2/6: hip  abduction, hip extension, SLR, bridge, hamstring curls, seated long arc quads 12/13/20: heel raise, toe raise, sit to stand    Consulted and Agree with Plan of Care Patient           Patient will benefit from skilled therapeutic intervention in order to improve the following deficits and impairments:  Pain,Improper body mechanics,Increased fascial restricitons,Abnormal gait,Difficulty walking,Impaired flexibility,Decreased strength,Decreased range of motion,Decreased activity tolerance,Hypomobility  Visit Diagnosis: Right knee pain, unspecified chronicity  Left knee pain, unspecified chronicity  Other abnormalities of gait and mobility     Problem List Patient Active Problem List   Diagnosis Date Noted   Vitamin D deficiency 04/05/2017  Mixed hyperlipidemia 04/14/2016   Morbid obesity ([place]) 04/14/2016   PERSONAL HISTORY OF COLONIC POLYPS 11/15/2010   Diabetes mellitus without complication ([place]) 98/47/3085   Hypersomnia with sleep apnea 06/16/2010    6:26 PM, 12/14/20 Josue Hector PT DPT  Physical Therapist with [facility]  [phone] [place] [address] [place], [place], [address] Phone: [phone]   Fax:  [phone]  Name: Rhyli Depaula MRN: [id number] Date of Birth: Oct 23, 1961

## 2020-12-14 NOTE — Progress Notes (Signed)
   12/14/20 0001  Knee/Hip Exercises: Stretches  Passive Hamstring Stretch Both;5 reps;10 seconds  Passive Hamstring Stretch Limitations on 12 inch step  Knee: Self-Stretch to increase Flexion Both;5 reps;10 seconds  Knee: Self-Stretch Limitations on 12 inch step  Gastroc Stretch Both;3 reps;30 seconds  Gastroc Stretch Limitations slant board  Knee/Hip Exercises: Standing  Heel Raises Both;20 reps  Heel Raises Limitations toe raise x 20  Gait Training 2 x 100" in clinic for improved heel strike and heel to toe transition  Knee/Hip Exercises: Seated  Sit to Sand without UE support;Other (comment) (2x 10 then 2x 10 with RTB around thighs)

## 2020-12-15 ENCOUNTER — Other Ambulatory Visit: Payer: Self-pay

## 2020-12-15 ENCOUNTER — Encounter (HOSPITAL_COMMUNITY): Payer: Self-pay

## 2020-12-15 ENCOUNTER — Ambulatory Visit (HOSPITAL_COMMUNITY): Payer: 59

## 2020-12-15 DIAGNOSIS — M25561 Pain in right knee: Secondary | ICD-10-CM

## 2020-12-15 DIAGNOSIS — R2689 Other abnormalities of gait and mobility: Secondary | ICD-10-CM | POA: Diagnosis not present

## 2020-12-15 DIAGNOSIS — M25562 Pain in left knee: Secondary | ICD-10-CM | POA: Diagnosis not present

## 2020-12-15 NOTE — Patient Instructions (Signed)
ABDUCTION: Standing (Active)    Stand, feet flat. Lift right leg out to side. Complete 2 sets of 10 repetitions. Perform 4 sessions per week.  http://gtsc.exer.us/111   Copyright  VHI. All rights reserved.

## 2020-12-15 NOTE — Therapy (Signed)
[place], [place], [address] Phone: [phone]   Fax:  [phone]  Physical Therapy Treatment  Patient Details  Name: Laura Valencia MRN: [id number] Date of Birth: 1961/10/23 Referring Provider (PT): Merlene Pulling PA-C    Encounter Date: 12/15/2020   PT End of Session - 12/15/20 1805    Visit Number 5    Number of Visits 8    Date for PT Re-Evaluation 12/30/20    Authorization Type [place] [employer]  (no VL, auth req at 25th visit)    PT Start Time 1755    PT Stop Time 1835    PT Time Calculation (min) 40 min    Activity Tolerance Patient tolerated treatment well;Patient limited by fatigue    Behavior During Therapy [facility] for tasks assessed/performed           Past Medical History:  Diagnosis Date  . Arthritis    knees  . Colon polyp    TYPE UNKNOWN  . Diabetes mellitus, type 2 ([place])   . Hyperthyroidism    HX; RX IN THE PAST  . OSA (obstructive sleep apnea)    NPSH 06-19-2010 AHI 9.3/HR  . Osteoarthritis   . Seasonal rhinitis    MILD  . Sleep apnea    occasional uses CPAP    Past Surgical History:  Procedure Laterality Date  . BREAST EXCISIONAL BIOPSY Left 10+ years ago   intraductal papilloma per pt  . CARPAL TUNNEL RELEASE     BILATERAL  . COLONOSCOPY  11/2010   [facility]  . TRIGGER FINGER RELEASE Left    middle finger    There were no vitals filed for this visit.   Subjective Assessment - 12/15/20 1802    Subjective Pt stated she is feeling good today, min dull exercises.  Reports she completed standing exercises at home in the morning.    Patient Stated Goals Improve mobility in knees and reduce pain    Currently in Pain? Yes    Pain Score 2     Pain Location Knee    Pain Orientation Right    Pain Descriptors / Indicators Dull    Pain Type Chronic pain    Pain Onset More than a month ago    Pain Frequency Constant    Aggravating Factors  WB-ing, bending, stairs     Pain Relieving Factors Meds, rest    Effect of Pain on Daily Activities limits                             [facility] Adult PT Treatment/Exercise - 12/15/20 0001      Knee/Hip Exercises: Stretches   Gastroc Stretch Both;3 reps;30 seconds    Gastroc Stretch Limitations slant board      Knee/Hip Exercises: Aerobic   Tread Mill Gait trainer 4' 1.18mph      Knee/Hip Exercises: Standing   Heel Raises Both;20 reps    Heel Raises Limitations toe raise x 20 incline slope    Hip Abduction Both;10 reps    Hip Extension Both;10 reps;Knee straight    Extension Limitations cueing for core engagement with task    Functional Squat 2 sets;5 reps    Functional Squat Limitations minisquats    Gait Training 226' cueing for heel to toe mechanics, core engagnement    Other Standing Knee Exercises sidestep RTB 2RT      Knee/Hip Exercises: Seated   Sit  to Sand 10 reps;without UE support   elevated height                   PT Short Term Goals - 12/05/20 1607      PT SHORT TERM GOAL #1   Title Patient will be independent with initial HEP and self-management strategies to improve functional outcomes    Time 2    Period Weeks    Status On-going    Target Date 12/16/20             PT Long Term Goals - 12/05/20 1607      PT LONG TERM GOAL #1   Title Patient will improve FOTO score by 10% to indicate improvement in functional outcomes    Time 4    Period Weeks    Status On-going      PT LONG TERM GOAL #2   Title Patient will have bilateral knee AROM 0-120 degrees to improve functional mobility, normalize ambulation and facilitate squatting to pick up items from floor.    Time 4    Period Weeks    Status Achieved      PT LONG TERM GOAL #3   Title Patient will report at least 75% overall improvement in subjective complaint to indicate improvement in ability to perform ADLs.    Time 4    Period Weeks    Status Achieved                 Plan - 12/15/20 1842     Clinical Impression Statement Session focus on gluteal strengthening and improving gait training.  Began gait trainer to improve visual awareness of stride length with verbal cueing for posture and heel to toe mechanics to improve mechanics.  Added gluteal strengthening exercises with cueing for posture.  No reports of increased pain at EOS, was limited by fatigue.    Examination-Activity Limitations Lift;Stand;Locomotion Level;Transfers    Examination-Participation Restrictions Occupation;Community Activity;Cleaning    Stability/Clinical Decision Making Stable/Uncomplicated    Clinical Decision Making Low    Rehab Potential Good    PT Frequency 2x / week    PT Duration 4 weeks    PT Treatment/Interventions ADLs/Self Care Home Management;Aquatic Therapy;Biofeedback;Cryotherapy;Ultrasound;Parrafin;Fluidtherapy;Therapeutic activities;Patient/family education;Manual lymph drainage;Manual techniques;Dry needling;Energy conservation;Splinting;Spinal Manipulations;Visual/perceptual remediation/compensation;Passive range of motion;Scar mobilization;Vasopneumatic Device;Joint Manipulations;Taping;Compression bandaging;Orthotic Fit/Training;Traction;Moist Heat;Functional mobility training;Stair training;Iontophoresis 4mg /ml Dexamethasone;Contrast Bath;Therapeutic exercise;DME Instruction;Balance training;Gait training;Neuromuscular re-education;Electrical Stimulation    PT Next Visit Plan Continue gait training. Work on [employer] and activation. Progress knee strength and mobility as tolerated. Add manual for pain and restriction if needed.    PT Home Exercise Plan Eval: heel slide, quad set  l2/6: hip abduction, hip extension, SLR, bridge, hamstring curls, seated long arc quads 12/13/20: heel raise, toe raise, sit to stand; 12/16: standing hip abd           Patient will benefit from skilled therapeutic intervention in order to improve the following deficits and impairments:  Pain,Improper body  mechanics,Increased fascial restricitons,Abnormal gait,Difficulty walking,Impaired flexibility,Decreased strength,Decreased range of motion,Decreased activity tolerance,Hypomobility  Visit Diagnosis: Right knee pain, unspecified chronicity  Left knee pain, unspecified chronicity  Other abnormalities of gait and mobility     Problem List Patient Active Problem List   Diagnosis Date Noted  . Vitamin D deficiency 04/05/2017  . Mixed hyperlipidemia 04/14/2016  . Morbid obesity ([place]) 04/14/2016  . PERSONAL HISTORY OF COLONIC POLYPS 11/15/2010  . Diabetes mellitus without complication ([place]) 35/70/1779  . Hypersomnia with sleep apnea 06/16/2010  Ihor Austin, LPTA/CLT; CBIS [phone]  Aldona Lento 12/15/2020, 6:50 PM  [place] [address] [place], [place], [address] Phone: [phone]   Fax:  [phone]  Name: Laura Valencia MRN: [id number] Date of Birth: 01-27-1961

## 2020-12-19 ENCOUNTER — Ambulatory Visit (HOSPITAL_COMMUNITY): Payer: 59 | Admitting: Physical Therapy

## 2020-12-19 ENCOUNTER — Telehealth (HOSPITAL_COMMUNITY): Payer: Self-pay | Admitting: Physical Therapy

## 2020-12-19 NOTE — Telephone Encounter (Signed)
pt called to cx today's appt due to her schedule

## 2020-12-21 ENCOUNTER — Other Ambulatory Visit: Payer: Self-pay

## 2020-12-21 ENCOUNTER — Ambulatory Visit (HOSPITAL_COMMUNITY): Payer: 59 | Admitting: Physical Therapy

## 2020-12-21 ENCOUNTER — Encounter (HOSPITAL_COMMUNITY): Payer: Self-pay | Admitting: Physical Therapy

## 2020-12-21 DIAGNOSIS — R2689 Other abnormalities of gait and mobility: Secondary | ICD-10-CM

## 2020-12-21 DIAGNOSIS — M25562 Pain in left knee: Secondary | ICD-10-CM | POA: Diagnosis not present

## 2020-12-21 DIAGNOSIS — M25561 Pain in right knee: Secondary | ICD-10-CM

## 2020-12-22 NOTE — Therapy (Signed)
[place], [place], [address] Phone: [phone]   Fax:  [phone]  Physical Therapy Treatment  Patient Details  Name: Laura Valencia MRN: [id number] Date of Birth: 1961/07/01 Referring Provider (PT): Merlene Pulling PA-C    Encounter Date: 12/21/2020   PT End of Session - 12/21/20 1744    Visit Number 6    Number of Visits 8    Date for PT Re-Evaluation 12/30/20    Authorization Type [place] [employer]  (no VL, auth req at 25th visit)    PT Start Time 1735    PT Stop Time 1820    PT Time Calculation (min) 45 min    Activity Tolerance Patient tolerated treatment well    Behavior During Therapy [facility] for tasks assessed/performed           Past Medical History:  Diagnosis Date   Arthritis    knees   Colon polyp    TYPE UNKNOWN   Diabetes mellitus, type 2 ([place])    Hyperthyroidism    HX; RX IN THE PAST   OSA (obstructive sleep apnea)    NPSH 06-19-2010 AHI 9.3/HR   Osteoarthritis    Seasonal rhinitis    MILD   Sleep apnea    occasional uses CPAP    Past Surgical History:  Procedure Laterality Date   BREAST EXCISIONAL BIOPSY Left 10+ years ago   intraductal papilloma per pt   CARPAL TUNNEL RELEASE     BILATERAL   COLONOSCOPY  11/2010   [facility] poylps   TRIGGER FINGER RELEASE Left    middle finger    There were no vitals filed for this visit.   Subjective Assessment - 12/21/20 1742    Subjective Patient says her RT knee is hurting more this week. Says she has been limping more. Says she does not think she did anything to irritate this.    Patient Stated Goals Improve mobility in knees and reduce pain    Currently in Pain? Yes    Pain Score 6     Pain Location Knee    Pain Orientation Right;Medial    Pain Descriptors / Indicators Aching;Sharp    Pain Type Chronic pain    Pain Onset More than a month ago    Pain Frequency Constant                              [facility] Adult PT Treatment/Exercise - 12/22/20 0001      Exercises   Exercises Lumbar      Lumbar Exercises: Stretches   Other Lumbar Stretch Exercise cross body glute stretch 5 x10" each      Lumbar Exercises: Machines for Strengthening   Cybex Knee Flexion 25# 3 x 10    Cybex Leg Press 30# 3 x 10      Knee/Hip Exercises: Stretches   Gastroc Stretch Both;3 reps;30 seconds    Gastroc Stretch Limitations slant board      Knee/Hip Exercises: Aerobic   Recumbent Bike 4 min dynamaic warmup      Knee/Hip Exercises: Standing   Heel Raises Both;20 reps    Heel Raises Limitations toe raise x 20 incline slope    Gait Training 226' cueing for heel to toe mechanics      Knee/Hip Exercises: Seated   Sit to Sand 10 reps;without UE support   cues for eccentric lowering     Manual  Therapy   Manual Therapy Soft tissue mobilization    Manual therapy comments performed separate from other activity    Soft tissue mobilization IASTM using theragun on lv 10 to RT adductors, medial hamstring and calf, patient in supine with LEs elevated on wedge                    PT Short Term Goals - 12/05/20 1607      PT SHORT TERM GOAL #1   Title Patient will be independent with initial HEP and self-management strategies to improve functional outcomes    Time 2    Period Weeks    Status On-going    Target Date 12/16/20             PT Long Term Goals - 12/05/20 1607      PT LONG TERM GOAL #1   Title Patient will improve FOTO score by 10% to indicate improvement in functional outcomes    Time 4    Period Weeks    Status On-going      PT LONG TERM GOAL #2   Title Patient will have bilateral knee AROM 0-120 degrees to improve functional mobility, normalize ambulation and facilitate squatting to pick up items from floor.    Time 4    Period Weeks    Status Achieved      PT LONG TERM GOAL #3   Title Patient will report at least 75% overall  improvement in subjective complaint to indicate improvement in ability to perform ADLs.    Time 4    Period Weeks    Status Achieved                 Plan - 12/21/20 1804    Clinical Impression Statement Patient with ongoing knee pain about medial RT knee. Added manual IASTM to address pain and restriction. Patient reports decreased pain non WB, but pain returns once WB. Added machine leg press and hamstring curls for LE strength progression. Patient tolerated these well. Retests eccentric sit to stands, which had provoked knee pain at previous session, patient able to perform with good control and no increased knee pain. Pain returned with walking. Continued gait training to correct for RT flat foot, decreased stride, Trendelenburg gait, and to improve heel to toe mechanics. Patient able to improve but with increased knee pain. Added hip rotation/ glute stretch to improve hip rotation. Retested gait mechanics, patient showed improved heel toe mechanics, more even stride length and reports no pain walking. Patient educated on proper form and function of all added exercise and on test/ retest assessments. Added hip rotation stretching to HEP. Patient will continue to benefit from skilled therapy services to correct gait mechanics and improve LE strength to reduce knee pain and improve functional mobility.    Examination-Activity Limitations Lift;Stand;Locomotion Level;Transfers    Examination-Participation Restrictions Occupation;Community Activity;Cleaning    Stability/Clinical Decision Making Stable/Uncomplicated    Rehab Potential Good    PT Frequency 2x / week    PT Duration 4 weeks    PT Treatment/Interventions ADLs/Self Care Home Management;Aquatic Therapy;Biofeedback;Cryotherapy;Ultrasound;Parrafin;Fluidtherapy;Therapeutic activities;Patient/family education;Manual lymph drainage;Manual techniques;Dry needling;Energy conservation;Splinting;Spinal Manipulations;Visual/perceptual  remediation/compensation;Passive range of motion;Scar mobilization;Vasopneumatic Device;Joint Manipulations;Taping;Compression bandaging;Orthotic Fit/Training;Traction;Moist Heat;Functional mobility training;Stair training;Iontophoresis 4mg /ml Dexamethasone;Contrast Bath;Therapeutic exercise;DME Instruction;Balance training;Gait training;Neuromuscular re-education;Electrical Stimulation    PT Next Visit Plan Continue gait training. Work on [employer] and activation. Progress knee strength and mobility as tolerated. Add manual for pain and restriction if needed.    PT Home Exercise  Plan Eval: heel slide, quad set  l2/6: hip abduction, hip extension, SLR, bridge, hamstring curls, seated long arc quads 12/13/20: heel raise, toe raise, sit to stand; 12/16: standing hip abd 12/22/20: hip rotation stretch    Consulted and Agree with Plan of Care Patient           Patient will benefit from skilled therapeutic intervention in order to improve the following deficits and impairments:  Pain,Improper body mechanics,Increased fascial restricitons,Abnormal gait,Difficulty walking,Impaired flexibility,Decreased strength,Decreased range of motion,Decreased activity tolerance,Hypomobility  Visit Diagnosis: Right knee pain, unspecified chronicity  Left knee pain, unspecified chronicity  Other abnormalities of gait and mobility     Problem List Patient Active Problem List   Diagnosis Date Noted   Vitamin D deficiency 04/05/2017   Mixed hyperlipidemia 04/14/2016   Morbid obesity ([place]) 04/14/2016   PERSONAL HISTORY OF COLONIC POLYPS 11/15/2010   Diabetes mellitus without complication ([place]) 0000000   Hypersomnia with sleep apnea 06/16/2010    6:06 PM, 12/22/20 Josue Hector PT DPT  Physical Therapist with [facility]  [phone] [place] [address] [place], [place], [address] Phone: [phone]   Fax:   [phone]  Name: Laura Valencia MRN: [id number] Date of Birth: 1961-04-26

## 2021-01-04 ENCOUNTER — Other Ambulatory Visit: Payer: Self-pay

## 2021-01-04 DIAGNOSIS — E119 Type 2 diabetes mellitus without complications: Secondary | ICD-10-CM

## 2021-01-04 DIAGNOSIS — E782 Mixed hyperlipidemia: Secondary | ICD-10-CM

## 2021-01-06 ENCOUNTER — Ambulatory Visit: Payer: 59 | Admitting: Family Medicine

## 2021-01-06 DIAGNOSIS — E782 Mixed hyperlipidemia: Secondary | ICD-10-CM | POA: Diagnosis not present

## 2021-01-06 DIAGNOSIS — E119 Type 2 diabetes mellitus without complications: Secondary | ICD-10-CM | POA: Diagnosis not present

## 2021-01-07 LAB — CMP14+EGFR
ALT: 27 IU/L (ref 0–32)
AST: 22 IU/L (ref 0–40)
Albumin/Globulin Ratio: 1.4 (ref 1.2–2.2)
Albumin: 4.8 g/dL (ref 3.8–4.8)
Alkaline Phosphatase: 91 IU/L (ref 44–121)
BUN/Creatinine Ratio: 19 (ref 12–28)
BUN: 15 mg/dL (ref 8–27)
Bilirubin Total: 0.6 mg/dL (ref 0.0–1.2)
CO2: 21 mmol/L (ref 20–29)
Calcium: 10.3 mg/dL (ref 8.7–10.3)
Chloride: 104 mmol/L (ref 96–106)
Creatinine, Ser: 0.79 mg/dL (ref 0.57–1.00)
GFR calc Af Amer: 93 mL/min/{1.73_m2} (ref >59)
GFR calc non Af Amer: 81 mL/min/{1.73_m2} (ref >59)
Globulin, Total: 3.4 g/dL (ref 1.5–4.5)
Glucose: 133 mg/dL — ABNORMAL HIGH (ref 65–99)
Potassium: 4.3 mmol/L (ref 3.5–5.2)
Sodium: 144 mmol/L (ref 134–144)
Total Protein: 8.2 g/dL (ref 6.0–8.5)

## 2021-01-07 LAB — CBC
Hematocrit: 40.3 % (ref 34.0–46.6)
Hemoglobin: 12.7 g/dL (ref 11.1–15.9)
MCH: 26.7 pg (ref 26.6–33.0)
MCHC: 31.5 g/dL (ref 31.5–35.7)
MCV: 85 fL (ref 79–97)
Platelets: 286 10*3/uL (ref 150–450)
RBC: 4.76 x10E6/uL (ref 3.77–5.28)
RDW: 13 % (ref 11.7–15.4)
WBC: 4.7 10*3/uL (ref 3.4–10.8)

## 2021-01-07 LAB — HEMOGLOBIN A1C
Est. average glucose Bld gHb Est-mCnc: 186 mg/dL
Hgb A1c MFr Bld: 8.1 % — ABNORMAL HIGH (ref 4.8–5.6)

## 2021-01-07 LAB — LIPID PANEL
Chol/HDL Ratio: 2.6 ratio (ref 0.0–4.4)
Cholesterol, Total: 170 mg/dL (ref 100–199)
HDL: 65 mg/dL (ref >39)
LDL Chol Calc (NIH): 92 mg/dL (ref 0–99)
Triglycerides: 70 mg/dL (ref 0–149)
VLDL Cholesterol Cal: 13 mg/dL (ref 5–40)

## 2021-01-09 ENCOUNTER — Telehealth: Payer: Self-pay | Admitting: Family Medicine

## 2021-01-09 ENCOUNTER — Other Ambulatory Visit: Payer: Self-pay

## 2021-01-09 ENCOUNTER — Other Ambulatory Visit: Payer: Self-pay | Admitting: Family Medicine

## 2021-01-09 ENCOUNTER — Telehealth (INDEPENDENT_AMBULATORY_CARE_PROVIDER_SITE_OTHER): Payer: 59 | Admitting: Family Medicine

## 2021-01-09 ENCOUNTER — Encounter: Payer: Self-pay | Admitting: Family Medicine

## 2021-01-09 VITALS — Wt 237.4 lb

## 2021-01-09 DIAGNOSIS — E782 Mixed hyperlipidemia: Secondary | ICD-10-CM

## 2021-01-09 DIAGNOSIS — E119 Type 2 diabetes mellitus without complications: Secondary | ICD-10-CM

## 2021-01-09 MED ORDER — EMPAGLIFLOZIN 25 MG PO TABS: 25.0000 mg | ORAL_TABLET | Freq: Every day | ORAL | 1 refills | Status: DC

## 2021-01-09 MED ORDER — METFORMIN HCL 1000 MG PO TABS: 1000.0000 mg | ORAL_TABLET | Freq: Two times a day (BID) | ORAL | 1 refills | Status: DC

## 2021-01-09 MED FILL — JARDIANCE 25 MG TABLET: 25 | 90 days supply | Qty: 90 | Fill #0

## 2021-01-09 NOTE — Progress Notes (Signed)
Patient ID: Laura Valencia, female    DOB: March 03, 1961, 60 y.o.   MRN: [id number]   Virtual Visit via Telephone Note  I connected with Joya Martyr on 01/09/21 at  3:30 PM EST by telephone and verified that I am speaking with the correct person using two identifiers.  Location: Patient: home Provider: office   I discussed the limitations, risks, security and privacy concerns of performing an evaluation and management service by telephone and the availability of in person appointments. I also discussed with the patient that there may be a patient responsible charge related to this service. The patient expressed understanding and agreed to proceed.   Chief Complaint  Patient presents with  . Diabetes   Subjective:    HPI   Pt here for follow up on DM. Taking meds as directed. Pt did see her lab results.   F/u DM2- Pt had a low a1c at 6.3 on last visit, however, this may be low due to the POCT labs, sometimes are showing 1-2 points below in office on fingerstick. On recheck had a1c at 8.1 from the lab. Pt is going to work harder on watching her diet.  Pt wanting to go up on the jardiance to 40m to 160m    Medical History [place] a past medical history of Arthritis, Colon polyp, Diabetes mellitus, type 2 ([place] Hyperthyroidism, OSA (obstructive sleep apnea), Osteoarthritis, Seasonal rhinitis, and Sleep apnea.   Outpatient Encounter Medications as of 01/09/2021  Medication Sig  . aspirin 81 MG tablet Take 81 mg by mouth daily.  . blood glucose meter kit and supplies Dispense based on patient and insurance preference. Use to check blood sugar daily . (FOR ICD-10 E10.9, E11.9).  . Marland Kitchenmpagliflozin (JARDIANCE) 25 MG TABS tablet Take 1 tablet (25 mg total) by mouth daily before breakfast.  . fluticasone (FLONASE) 50 MCG/ACT nasal spray Place 1 spray into both nostrils daily.  . rosuvastatin (CRESTOR) 5 MG tablet Take 1 tablet (5 mg total) by mouth  daily.  . [DISCONTINUED] empagliflozin (JARDIANCE) 10 MG TABS tablet Take 1 tablet (10 mg total) by mouth daily before breakfast.  . [DISCONTINUED] metFORMIN (GLUCOPHAGE) 1000 MG tablet Take 1 tablet (1,000 mg total) by mouth 2 (two) times daily with a meal.  . metFORMIN (GLUCOPHAGE) 1000 MG tablet Take 1 tablet (1,000 mg total) by mouth 2 (two) times daily with a meal.   No facility-administered encounter medications on file as of 01/09/2021.     Review of Systems  Constitutional: Negative for chills and fever.  HENT: Negative for congestion, rhinorrhea and sore throat.   Respiratory: Negative for cough, shortness of breath and wheezing.   Cardiovascular: Negative for chest pain and leg swelling.  Gastrointestinal: Negative for abdominal pain, diarrhea, nausea and vomiting.  Genitourinary: Negative for dysuria and frequency.  Musculoskeletal: Negative for arthralgias and back pain.  Skin: Negative for rash.  Neurological: Negative for dizziness, weakness and headaches.      Vitals Wt 237 lb 6.4 oz (107.7 kg)   LMP 03/19/2012   BMI 42.05 kg/m   Objective:   Physical Exam  No PE due to phone visit.  Assessment and Plan   1. Diabetes mellitus without complication (HCC) - metFORMIN (GLUCOPHAGE) 1000 MG tablet; Take 1 tablet (1,000 mg total) by mouth 2 (two) times daily with a meal.  Dispense: 180 tablet; Refill: 1 - empagliflozin (JARDIANCE) 25 MG TABS tablet; Take 1 tablet (25 mg total) by mouth daily before breakfast.  Dispense: 90 tablet; Refill: 1  2. Mixed hyperlipidemia   dm2- uncontrolled. a1c elevated at 8.1, will increase from 51m jardiance to 281mjardiance.  Pt stating she will work on [employer]n diet.   HLD- stable. Controlled. Cont meds.  Pt to f/u 28m25mor recheck.    Follow Up Instructions:    I discussed the assessment and treatment plan with the patient. The patient was provided an opportunity to ask questions and all were answered. The patient  agreed with the plan and demonstrated an understanding of the instructions.   The patient was advised to call back or seek an in-person evaluation if the symptoms worsen or if the condition fails to improve as anticipated.  I provided 15 minutes of non-face-to-face time during this encounter.

## 2021-01-09 NOTE — Telephone Encounter (Signed)
[name], desrocher are scheduled for a virtual visit with your provider today.    Just as we do with appointments in the office, we must obtain your consent to participate.  Your consent will be active for this visit and any virtual visit you may have with one of our providers in the next 365 days.    If you have a MyChart account, I can also send a copy of this consent to you electronically.  All virtual visits are billed to your insurance company just like a traditional visit in the office.  As this is a virtual visit, video technology does not allow for your provider to perform a traditional examination.  This may limit your provider's ability to fully assess your condition.  If your provider identifies any concerns that need to be evaluated in person or the need to arrange testing such as labs, EKG, etc, we will make arrangements to do so.    Although advances in technology are sophisticated, we cannot ensure that it will always work on either your end or our end.  If the connection with a video visit is poor, we may have to switch to a telephone visit.  With either a video or telephone visit, we are not always able to ensure that we have a secure connection.   I need to obtain your verbal consent now.   Are you willing to proceed with your visit today?   Laura Valencia has provided verbal consent on 01/09/2021 for a virtual visit (video or telephone).   Vicente Males, LPN 08/28/5620  3:08 PM

## 2021-01-13 ENCOUNTER — Other Ambulatory Visit: Payer: Self-pay

## 2021-01-13 ENCOUNTER — Ambulatory Visit
Admission: RE | Admit: 2021-01-13 | Discharge: 2021-01-13 | Disposition: A | Discharge status: Home / Self Care | Payer: 59 | Source: Ambulatory Visit | Attending: Family Medicine | Admitting: Family Medicine

## 2021-01-13 ENCOUNTER — Other Ambulatory Visit: Payer: Self-pay | Admitting: Family Medicine

## 2021-01-13 DIAGNOSIS — Z1231 Encounter for screening mammogram for malignant neoplasm of breast: Secondary | ICD-10-CM | POA: Diagnosis not present

## 2021-01-19 ENCOUNTER — Encounter: Payer: Self-pay | Admitting: *Deleted

## 2021-02-06 MED FILL — ROSUVASTATIN CALCIUM 5 MG T: 5 | 90 days supply | Qty: 90 | Fill #1

## 2021-02-06 MED FILL — METFORMIN HCL 1000 MG TABS: 1000 | 90 days supply | Qty: 180 | Fill #0

## 2021-03-23 ENCOUNTER — Other Ambulatory Visit (HOSPITAL_BASED_OUTPATIENT_CLINIC_OR_DEPARTMENT_OTHER): Payer: Self-pay

## 2021-04-21 DIAGNOSIS — M170 Bilateral primary osteoarthritis of knee: Secondary | ICD-10-CM | POA: Diagnosis not present

## 2021-04-26 ENCOUNTER — Other Ambulatory Visit (HOSPITAL_COMMUNITY): Payer: Self-pay

## 2021-04-26 ENCOUNTER — Other Ambulatory Visit: Payer: Self-pay | Admitting: Family Medicine

## 2021-04-26 MED FILL — Empagliflozin Tab 25 MG: ORAL | 90 days supply | Qty: 90 | Fill #0 | Status: CN

## 2021-04-26 MED FILL — Cyclosporine (Ophth) Emulsion 0.05%: OPHTHALMIC | 30 days supply | Qty: 24 | Fill #0 | Status: AC

## 2021-04-27 ENCOUNTER — Other Ambulatory Visit (HOSPITAL_COMMUNITY): Payer: Self-pay

## 2021-04-27 DIAGNOSIS — E119 Type 2 diabetes mellitus without complications: Secondary | ICD-10-CM

## 2021-04-27 MED ORDER — EMPAGLIFLOZIN 25 MG PO TABS
ORAL_TABLET | Freq: Every day | ORAL | 1 refills | Status: DC
Start: 2021-04-27 — End: 2021-12-19
  Filled 2021-04-27: qty 90, 90d supply, fill #0
  Filled 2021-04-27: qty 90, fill #0
  Filled 2021-08-28: qty 90, 90d supply, fill #1

## 2021-04-27 MED ORDER — JANUMET XR 50-1000 MG PO TB24
1.0000 | ORAL_TABLET | Freq: Two times a day (BID) | ORAL | 1 refills | Status: DC
  Filled 2021-04-27: qty 180, 90d supply, fill #0
  Filled 2021-04-28: qty 60, 30d supply, fill #0
  Filled 2021-05-24: qty 60, 30d supply, fill #1
  Filled 2021-06-26: qty 60, 30d supply, fill #2
  Filled 2021-07-27: qty 60, 30d supply, fill #3

## 2021-04-27 MED ORDER — GLUCOSE BLOOD VI STRP
ORAL_STRIP | 4 refills | Status: DC
  Filled 2021-04-27: qty 50, 50d supply, fill #0

## 2021-04-28 ENCOUNTER — Other Ambulatory Visit (HOSPITAL_COMMUNITY): Payer: Self-pay

## 2021-05-16 ENCOUNTER — Telehealth: Payer: Self-pay | Admitting: Family Medicine

## 2021-05-16 DIAGNOSIS — E782 Mixed hyperlipidemia: Secondary | ICD-10-CM

## 2021-05-16 DIAGNOSIS — E119 Type 2 diabetes mellitus without complications: Secondary | ICD-10-CM

## 2021-05-16 DIAGNOSIS — Z79899 Other long term (current) drug therapy: Secondary | ICD-10-CM

## 2021-05-16 NOTE — Telephone Encounter (Signed)
Patient is requesting labs for her follow up visit. °

## 2021-05-16 NOTE — Telephone Encounter (Signed)
Last labs completed 01/06/21 CMP14+EGR, LIPID, A1C, CBC. Please advise. Thank you

## 2021-05-16 NOTE — Telephone Encounter (Signed)
Ok yes pls order. Thx. Dr. Darene Lamer

## 2021-05-16 NOTE — Telephone Encounter (Signed)
Lab orders placed. Sent patient a mychart message.

## 2021-05-24 ENCOUNTER — Other Ambulatory Visit: Payer: Self-pay | Admitting: Family Medicine

## 2021-05-24 ENCOUNTER — Other Ambulatory Visit (HOSPITAL_COMMUNITY): Payer: Self-pay

## 2021-05-24 MED ORDER — ROSUVASTATIN CALCIUM 5 MG PO TABS
ORAL_TABLET | Freq: Every day | ORAL | 1 refills | Status: DC
  Filled 2021-05-24: qty 90, 90d supply, fill #0
  Filled 2021-11-08: qty 90, 90d supply, fill #1

## 2021-06-01 ENCOUNTER — Other Ambulatory Visit (HOSPITAL_COMMUNITY): Payer: Self-pay

## 2021-06-23 DIAGNOSIS — M170 Bilateral primary osteoarthritis of knee: Secondary | ICD-10-CM | POA: Diagnosis not present

## 2021-06-26 ENCOUNTER — Other Ambulatory Visit (HOSPITAL_COMMUNITY): Payer: Self-pay

## 2021-06-30 DIAGNOSIS — M170 Bilateral primary osteoarthritis of knee: Secondary | ICD-10-CM | POA: Diagnosis not present

## 2021-07-07 ENCOUNTER — Other Ambulatory Visit (HOSPITAL_COMMUNITY): Payer: Self-pay

## 2021-07-07 DIAGNOSIS — M170 Bilateral primary osteoarthritis of knee: Secondary | ICD-10-CM | POA: Diagnosis not present

## 2021-07-27 ENCOUNTER — Other Ambulatory Visit (HOSPITAL_COMMUNITY): Payer: Self-pay

## 2021-07-27 MED ORDER — CYCLOSPORINE 0.05 % OP EMUL
1.0000 [drp] | Freq: Two times a day (BID) | OPHTHALMIC | 4 refills | Status: DC
  Filled 2021-07-27 – 2021-07-31 (×2): qty 180, 90d supply, fill #0
  Filled 2021-11-08: qty 180, 90d supply, fill #1
  Filled 2022-02-27: qty 180, 90d supply, fill #2
  Filled 2022-06-05: qty 180, 90d supply, fill #3

## 2021-07-31 ENCOUNTER — Other Ambulatory Visit (HOSPITAL_COMMUNITY): Payer: Self-pay

## 2021-08-01 ENCOUNTER — Other Ambulatory Visit (HOSPITAL_COMMUNITY): Payer: Self-pay

## 2021-08-04 ENCOUNTER — Ambulatory Visit: Payer: 59 | Admitting: Family Medicine

## 2021-08-14 DIAGNOSIS — E119 Type 2 diabetes mellitus without complications: Secondary | ICD-10-CM | POA: Diagnosis not present

## 2021-08-14 DIAGNOSIS — E782 Mixed hyperlipidemia: Secondary | ICD-10-CM | POA: Diagnosis not present

## 2021-08-14 DIAGNOSIS — Z79899 Other long term (current) drug therapy: Secondary | ICD-10-CM | POA: Diagnosis not present

## 2021-08-15 LAB — CBC WITH DIFFERENTIAL/PLATELET
Basophils Absolute: 0 10*3/uL (ref 0.0–0.2)
Basos: 0 %
EOS (ABSOLUTE): 0 10*3/uL (ref 0.0–0.4)
Eos: 1 %
Hematocrit: 40.7 % (ref 34.0–46.6)
Hemoglobin: 13 g/dL (ref 11.1–15.9)
Immature Grans (Abs): 0 10*3/uL (ref 0.0–0.1)
Immature Granulocytes: 0 %
Lymphocytes Absolute: 1.4 10*3/uL (ref 0.7–3.1)
Lymphs: 36 %
MCH: 27.3 pg (ref 26.6–33.0)
MCHC: 31.9 g/dL (ref 31.5–35.7)
MCV: 85 fL (ref 79–97)
Monocytes Absolute: 0.5 10*3/uL (ref 0.1–0.9)
Monocytes: 12 %
Neutrophils Absolute: 2 10*3/uL (ref 1.4–7.0)
Neutrophils: 51 %
Platelets: 262 10*3/uL (ref 150–450)
RBC: 4.77 x10E6/uL (ref 3.77–5.28)
RDW: 13.9 % (ref 11.7–15.4)
WBC: 4 10*3/uL (ref 3.4–10.8)

## 2021-08-15 LAB — LIPID PANEL
Chol/HDL Ratio: 2.7 ratio (ref 0.0–4.4)
Cholesterol, Total: 159 mg/dL (ref 100–199)
HDL: 60 mg/dL (ref >39)
LDL Chol Calc (NIH): 85 mg/dL (ref 0–99)
Triglycerides: 70 mg/dL (ref 0–149)
VLDL Cholesterol Cal: 14 mg/dL (ref 5–40)

## 2021-08-15 LAB — CMP14+EGFR
ALT: 20 IU/L (ref 0–32)
AST: 18 IU/L (ref 0–40)
Albumin/Globulin Ratio: 2.2 (ref 1.2–2.2)
Albumin: 4.8 g/dL (ref 3.8–4.8)
Alkaline Phosphatase: 76 IU/L (ref 44–121)
BUN/Creatinine Ratio: 22 (ref 12–28)
BUN: 17 mg/dL (ref 8–27)
Bilirubin Total: 0.7 mg/dL (ref 0.0–1.2)
CO2: 22 mmol/L (ref 20–29)
Calcium: 9.7 mg/dL (ref 8.7–10.3)
Chloride: 108 mmol/L — ABNORMAL HIGH (ref 96–106)
Creatinine, Ser: 0.76 mg/dL (ref 0.57–1.00)
Globulin, Total: 2.2 g/dL (ref 1.5–4.5)
Glucose: 116 mg/dL — ABNORMAL HIGH (ref 65–99)
Potassium: 4.3 mmol/L (ref 3.5–5.2)
Sodium: 143 mmol/L (ref 134–144)
Total Protein: 7 g/dL (ref 6.0–8.5)
eGFR: 89 mL/min/{1.73_m2} (ref >59)

## 2021-08-15 LAB — HEMOGLOBIN A1C
Est. average glucose Bld gHb Est-mCnc: 154 mg/dL
Hgb A1c MFr Bld: 7 % — ABNORMAL HIGH (ref 4.8–5.6)

## 2021-08-19 ENCOUNTER — Other Ambulatory Visit (HOSPITAL_COMMUNITY): Payer: Self-pay

## 2021-08-25 ENCOUNTER — Other Ambulatory Visit: Payer: Self-pay

## 2021-08-25 ENCOUNTER — Encounter: Payer: Self-pay | Admitting: Family Medicine

## 2021-08-25 ENCOUNTER — Ambulatory Visit: Payer: 59 | Admitting: Family Medicine

## 2021-08-25 VITALS — BP 121/82 | HR 76 | Temp 97.5°F | Ht 66.5 in | Wt 237.4 lb

## 2021-08-25 DIAGNOSIS — E785 Hyperlipidemia, unspecified: Secondary | ICD-10-CM | POA: Diagnosis not present

## 2021-08-25 DIAGNOSIS — G4710 Hypersomnia, unspecified: Secondary | ICD-10-CM | POA: Diagnosis not present

## 2021-08-25 DIAGNOSIS — Z23 Encounter for immunization: Secondary | ICD-10-CM

## 2021-08-25 DIAGNOSIS — E1169 Type 2 diabetes mellitus with other specified complication: Secondary | ICD-10-CM

## 2021-08-25 DIAGNOSIS — G4730 Sleep apnea, unspecified: Secondary | ICD-10-CM | POA: Diagnosis not present

## 2021-08-25 DIAGNOSIS — M170 Bilateral primary osteoarthritis of knee: Secondary | ICD-10-CM | POA: Insufficient documentation

## 2021-08-25 DIAGNOSIS — Z7689 Persons encountering health services in other specified circumstances: Secondary | ICD-10-CM

## 2021-08-25 MED ORDER — TIRZEPATIDE 2.5 MG/0.5ML ~~LOC~~ SOAJ: 2.5000 mg | SUBCUTANEOUS | 0 refills | Status: DC

## 2021-08-25 NOTE — Progress Notes (Signed)
Subjective: HA:LPFXTKWIO care, DM, HTN, HLD HPI: Laura Valencia is a 60 y.o. female presenting to clinic today for:  1. Type 2 Diabetes with hypertension, hyperlipidemia/morbid obesity, OSA:  Currently treated with Jardiance 25 mg daily, Janumet extended release 50-1000 mg, Crestor 5 mg daily.  Blood pressure has been diet controlled.  No personal history of retinopathy, DKA, pancreatitis.  She has never been on a GLP but would be interested in [place].  No personal or family history of medullary thyroid cancer, multiple endocrine neoplasia type II.  She does have a history of hyperthyroidism that was treated with radioactive iodine and she is had no issues with her thyroid since.  She has obstructive sleep apnea and is compliant with her CPAP machine  Last eye exam: Sees Dr. B regularly.  No history of retinopathy Last foot exam: Needs Last A1c:  Lab Results  Component Value Date   HGBA1C 7.0 (H) 08/14/2021   Nephropathy screen indicated?:  Due September 8 Last flu, zoster and/or pneumovax:  Immunization History  Administered Date(s) Administered   Influenza Whole 09/30/2010   Influenza-Unspecified 10/05/2016   Moderna Sars-Covid-2 Vaccination 12/29/2019, 01/25/2020   Pneumococcal Polysaccharide-23 09/02/2020   Td 10/09/2016    ROS: No foot ulcers, reports of chest pain or shortness of breath.  No reports of visual disturbance.   Past Medical History:  Diagnosis Date   Arthritis    knees   Colon polyp    TYPE UNKNOWN   Diabetes mellitus, type 2 ([place])    Hyperthyroidism    HX; RX IN THE PAST   OSA (obstructive sleep apnea)    [facility]PSH 06-19-2010 AHI 9.3/HR   Osteoarthritis    Seasonal rhinitis    MILD   Sleep apnea    occasional uses CPAP   Past Surgical History:  Procedure Laterality Date   BREAST EXCISIONAL BIOPSY Left 10+ years ago   intraductal papilloma per pt   CARPAL TUNNEL RELEASE     BILATERAL   COLONOSCOPY  11/2010   [facility]   TRIGGER  FINGER RELEASE Left    middle finger   Social History   Socioeconomic History   Marital status: Married    Spouse name: Not on file   Number of children: 2   Years of education: Not on file   Highest education level: Not on file  Occupational History   Occupation: MD    Employer: [employer]    Comment: FAMILY MEDICINE  Tobacco Use   Smoking status: Never   Smokeless tobacco: Never  Vaping Use   Vaping Use: Never used  Substance and Sexual Activity   Alcohol use: Yes    Comment: RARE USE OF ETOH   Drug use: No   Sexual activity: Yes    Birth control/protection: Post-menopausal  Other Topics Concern   Not on file  Social History Narrative   [place].   Social Determinants of Health   Financial Resource Strain: Not on file  Food Insecurity: Not on file  Transportation Needs: Not on file  Physical Activity: Not on file  Stress: Not on file  Social Connections: Not on file  Intimate Partner Violence: Not on file   Current Meds  Medication Sig   aspirin 81 MG tablet Take 81 mg by mouth daily.   blood glucose meter kit and supplies Dispense based on patient and insurance preference. Use to check blood sugar daily . (FOR ICD-10 E10.9, E11.9).   cycloSPORINE (RESTASIS) 0.05 %  ophthalmic emulsion Place 1 drop into both eyes 2 (two) times daily.   empagliflozin (JARDIANCE) 25 MG TABS tablet TAKE 1 TABLET BY MOUTH DAILY BEFORE BREAKFAST   glucose blood test strip Use 1 strip to check blood glucose once daily.   rosuvastatin (CRESTOR) 5 MG tablet TAKE 1 TABLET (5 MG TOTAL) BY MOUTH DAILY.   SitaGLIPtin-MetFORMIN HCl (JANUMET XR) 50-1000 MG TB24 Take 1 tablet by mouth 2 (two) times daily.   [DISCONTINUED] metFORMIN (GLUCOPHAGE) 1000 MG tablet TAKE 1 TABLET BY MOUTH 2 TIMES DAILY WITH A MEAL   Family History  Problem Relation Age of Onset   Colon cancer Mother 24   Colon cancer Sister    Esophageal cancer Neg Hx    Stomach cancer Neg Hx     Rectal cancer Neg Hx    No Known Allergies   Health Maintenance: colonoscopy, DM foot and urine micro  ROS: Per HPI  Objective: Office vital signs reviewed. BP 121/82   Pulse 76   Temp (!) 97.5 F (36.4 C)   Ht 5' 6.5" (1.689 m)   Wt 237 lb 6.4 oz (107.7 kg)   LMP 03/19/2012   SpO2 98%   BMI 37.74 kg/m   Physical Examination:  General: Awake, alert, well nourished, well-appearing obese female.  No acute distress HEENT: Normal; sclera white.  No exophthalmos.  Mild fullness on the right side of thyroid without dominant masses. Cardio: regular rate and rhythm, S1S2 heard, no murmurs appreciated Pulm: clear to auscultation bilaterally, no wheezes, rhonchi or rales; normal work of breathing on room air Extremities: warm, well perfused, No edema, cyanosis or clubbing; +2 pulses bilaterally MSK: Ambulating independently Neuro: See DM foot exam  Diabetic Foot Exam - Simple   Simple Foot Form Diabetic Foot exam was performed with the following findings: Yes 08/25/2021  2:47 PM  Visual Inspection No deformities, no ulcerations, no other skin breakdown bilaterally: Yes Sensation Testing Intact to touch and monofilament testing bilaterally: Yes Pulse Check Posterior Tibialis and Dorsalis pulse intact bilaterally: Yes Comments Varicose veins noted in the lower legs bilaterally     Assessment/ Plan: 60 y.o. female   Controlled type 2 diabetes mellitus with other specified complication, without long-term current use of insulin ([place]) - Plan: tirzepatide Darcel Bayley) 2.5 MG/0.5ML Pen  Hyperlipidemia associated with type 2 diabetes mellitus ([place])  Establishing care with new doctor, encounter for  Morbid obesity ([place])  Hypersomnia with sleep apnea  Primary osteoarthritis of both knees  Sugar was technically under control on her 08/14/2021 labs with A1c of 7.0.  However, she continues to struggle with obesity and would like tighter control of her blood sugar.  I think this is  reasonable.  Discontinue Janumet, start Mounjaro 2.5 mg each week.  Counseled on avoidance of carbonated beverages.  Okay to continue Jardiance.  I think this has good cardiovascular benefits and I would like to have something avoid that continues to control blood sugars as we titrate Mounjaro.  We will reconvene again in 4 weeks via telephone visit, sooner if concerns arise and titrate Mounjaro accordingly  Diabetic eye exam is scheduled.  Plan for urine microalbumin at next in person visit.  Cholesterol stable.  No changes needed to Crestor  Does not require any CPAP machine.  Continue current regimen  Continues to struggle with degenerative arthritis of bilateral knees.  I agree with weight loss as above to improve some of the symptomology but anticipate at some point she will require surgical intervention.  Would  like to maximize BMI prior to proceeding with any surgical interventions so that she heals well  Shingrix vaccination administered  Janora Norlander, [place] [phone]

## 2021-08-25 NOTE — Patient Instructions (Signed)
Mounjaro added once weekly.  Avoid carbonated drinks to reduce bloating.  Monitor BGs.  Plan to increase to 5.'0mg'$  next month if tolerated.

## 2021-08-28 ENCOUNTER — Other Ambulatory Visit (HOSPITAL_COMMUNITY): Payer: Self-pay

## 2021-08-28 ENCOUNTER — Encounter: Payer: Self-pay | Admitting: Gastroenterology

## 2021-08-28 DIAGNOSIS — H524 Presbyopia: Secondary | ICD-10-CM | POA: Diagnosis not present

## 2021-08-28 LAB — HM DIABETES EYE EXAM

## 2021-09-06 DIAGNOSIS — M1711 Unilateral primary osteoarthritis, right knee: Secondary | ICD-10-CM | POA: Diagnosis not present

## 2021-09-11 ENCOUNTER — Ambulatory Visit: Payer: 59 | Admitting: Family Medicine

## 2021-09-15 ENCOUNTER — Other Ambulatory Visit (HOSPITAL_BASED_OUTPATIENT_CLINIC_OR_DEPARTMENT_OTHER): Payer: Self-pay

## 2021-09-20 DIAGNOSIS — G4733 Obstructive sleep apnea (adult) (pediatric): Secondary | ICD-10-CM | POA: Diagnosis not present

## 2021-09-24 ENCOUNTER — Encounter: Payer: Self-pay | Admitting: Family Medicine

## 2021-09-25 ENCOUNTER — Other Ambulatory Visit (HOSPITAL_COMMUNITY): Payer: Self-pay

## 2021-09-25 ENCOUNTER — Other Ambulatory Visit: Payer: Self-pay | Admitting: Family Medicine

## 2021-09-25 DIAGNOSIS — E1169 Type 2 diabetes mellitus with other specified complication: Secondary | ICD-10-CM

## 2021-09-25 MED ORDER — TIRZEPATIDE 5 MG/0.5ML ~~LOC~~ SOAJ
5.0000 mg | SUBCUTANEOUS | 3 refills | Status: DC
  Filled 2021-09-25: qty 2, 28d supply, fill #0

## 2021-10-03 ENCOUNTER — Other Ambulatory Visit (HOSPITAL_COMMUNITY): Payer: Self-pay

## 2021-10-13 ENCOUNTER — Encounter: Payer: Self-pay | Admitting: Family Medicine

## 2021-10-16 ENCOUNTER — Ambulatory Visit: Payer: 59 | Admitting: Family Medicine

## 2021-10-16 ENCOUNTER — Other Ambulatory Visit (HOSPITAL_COMMUNITY): Payer: Self-pay

## 2021-10-16 DIAGNOSIS — E1169 Type 2 diabetes mellitus with other specified complication: Secondary | ICD-10-CM

## 2021-10-16 MED ORDER — TIRZEPATIDE 7.5 MG/0.5ML ~~LOC~~ SOAJ
7.5000 mg | SUBCUTANEOUS | 3 refills | Status: DC
  Filled 2021-10-16: qty 6, 84d supply, fill #0

## 2021-10-16 NOTE — Progress Notes (Signed)
Telephone visit  Subjective: CC: DM PCP: Janora Norlander, DO [id number] Laura Valencia is a 60 y.o. female calls for telephone consult today. Patient provides verbal consent for consult held via phone.  Due to COVID-19 pandemic this visit was conducted virtually. This visit type was conducted due to national recommendations for restrictions regarding the COVID-19 Pandemic (e.g. social distancing, sheltering in place) in an effort to limit this patient's exposure and mitigate transmission in our community. All issues noted in this document were discussed and addressed.  A physical exam was not performed with this format.   Location of patient: home Location of provider: [facility] Others present for call: none  1. DM Patient reports that the [facility] is helping suppress her appetite. Her weight has fluctuated a bit since last visit.  She admits that she is not eating very well (has not been restrictive).  BGs are controlled.  BG 105-109 fasting.  She had a knee injection recently as well.  No abdominal pain, nausea or dizziness.  No excessive dryness.    ROS: Per HPI  No Known Allergies Past Medical History:  Diagnosis Date   Arthritis    knees   Colon polyp    TYPE UNKNOWN   Diabetes mellitus, type 2 ([place])    Hyperthyroidism    HX; RX IN THE PAST   OSA (obstructive sleep apnea)    NPSH 06-19-2010 AHI 9.3/HR   Osteoarthritis    Seasonal rhinitis    MILD   Sleep apnea    occasional uses CPAP    Current Outpatient Medications:    aspirin 81 MG tablet, Take 81 mg by mouth daily., Disp: , Rfl:    blood glucose meter kit and supplies, Dispense based on patient and insurance preference. Use to check blood sugar daily . (FOR ICD-10 E10.9, E11.9)., Disp: 1 each, Rfl: 0   cycloSPORINE (RESTASIS) 0.05 % ophthalmic emulsion, Place 1 drop into both eyes 2 (two) times daily., Disp: 10800 each, Rfl: 4   empagliflozin (JARDIANCE) 25 MG TABS tablet, TAKE 1 TABLET BY MOUTH DAILY BEFORE  BREAKFAST, Disp: 90 tablet, Rfl: 1   glucose blood test strip, Use 1 strip to check blood glucose once daily., Disp: 50 each, Rfl: 4   rosuvastatin (CRESTOR) 5 MG tablet, TAKE 1 TABLET (5 MG TOTAL) BY MOUTH DAILY., Disp: 90 tablet, Rfl: 1   tirzepatide (MOUNJARO) 5 MG/0.5ML Pen, Inject 1 pen (5 mg) into the skin once a week., Disp: 2 mL, Rfl: 3  Assessment/ Plan: 60 y.o. female   Controlled type 2 diabetes mellitus with other specified complication, without long-term current use of insulin (HCC) - Plan: tirzepatide (MOUNJARO) 7.5 MG/0.5ML Pen, Hemoglobin A1c  Morbid obesity ([place]) - Plan: tirzepatide (MOUNJARO) 7.5 MG/0.5ML Pen, Hemoglobin A1c  Sugars controlled.  Anticipate ongoing control of blood sugar.  Suspect that weight fluctuation is totally dependent on her intake.  She is tolerating the medication without difficulty and therefore we will advance the dose as far as she can tolerate to maximize weight loss.  7.5 mg has been sent to pharmacy.  She will have A1c checked somewhere around end of November, she will contact me prior to that time if any concerns or questions arise  Start time: 12:21pm End time: 12:26pm  Total time spent on patient care (including telephone call/ virtual visit): 5 minutes  [place], [place] [phone]

## 2021-10-17 ENCOUNTER — Other Ambulatory Visit (HOSPITAL_COMMUNITY): Payer: Self-pay

## 2021-10-18 ENCOUNTER — Other Ambulatory Visit (HOSPITAL_COMMUNITY): Payer: Self-pay

## 2021-11-08 ENCOUNTER — Other Ambulatory Visit (HOSPITAL_COMMUNITY): Payer: Self-pay

## 2021-11-10 ENCOUNTER — Other Ambulatory Visit (HOSPITAL_COMMUNITY): Payer: Self-pay

## 2021-11-13 ENCOUNTER — Other Ambulatory Visit (HOSPITAL_COMMUNITY): Payer: Self-pay

## 2021-11-13 ENCOUNTER — Other Ambulatory Visit: Payer: Self-pay

## 2021-11-13 ENCOUNTER — Ambulatory Visit (AMBULATORY_SURGERY_CENTER): Payer: 59 | Admitting: *Deleted

## 2021-11-13 VITALS — Ht 66.5 in | Wt 237.0 lb

## 2021-11-13 DIAGNOSIS — Z860100 Personal history of colon polyps, unspecified: Secondary | ICD-10-CM

## 2021-11-13 DIAGNOSIS — Z800 Family history of malignant neoplasm of digestive organs: Secondary | ICD-10-CM

## 2021-11-13 MED ORDER — NA SULFATE-K SULFATE-MG SULF 17.5-3.13-1.6 GM/177ML PO SOLN
1.0000 | Freq: Once | ORAL | 0 refills | Status: AC
  Filled 2021-11-13: qty 354, 1d supply, fill #0

## 2021-11-13 NOTE — Progress Notes (Signed)
Virtual pre visit completed over telephone, Instructions forwarded through [place] and mailed to home address.    No egg or soy allergy known to patient  No issues known to pt with past sedation with any surgeries or procedures Patient denies ever being told they had issues or difficulty with intubation  No FH of Malignant Hyperthermia Pt is not on diet pills Pt is not on  home 02  Pt is not on blood thinners  Pt denies issues with constipation  No A fib or A flutter  Pt is fully vaccinated  for Covid   Discussed with pt there will be an out-of-pocket cost for prep and that varies from $0 to 70 +  dollars - pt verbalized understanding   Due to the COVID-19 pandemic we are asking patients to follow certain guidelines in PV and the [facility]   Pt aware of COVID protocols and [facility] guidelines

## 2021-11-16 ENCOUNTER — Encounter (HOSPITAL_COMMUNITY): Payer: Self-pay | Admitting: Physical Therapy

## 2021-11-16 NOTE — Therapy (Signed)
[facility] [address] [place], [place], [address] Phone: [phone]   Fax:  [phone]  Patient Details  Name: Laura Valencia MRN: [id number] Date of Birth: 07/05/1961 Referring Provider:  No ref. provider found  Encounter Date: 11/16/2021  PHYSICAL THERAPY DISCHARGE SUMMARY  Visits from Start of Care: 6  Current functional level related to goals / functional outcomes: NA   Remaining deficits: NA   Education / Equipment: NA   Patient agrees to discharge. Patient goals were partially met. Patient is being discharged due to not returning since the last visit.  5:11 PM, 11/16/21 Cameron Caffaro PT DPT  Physical Therapist with [place]  [facility]  [phone]  [facility] [address] [place], , [address] Phone: [phone]   Fax:  [phone] 

## 2021-11-17 ENCOUNTER — Other Ambulatory Visit: Payer: Self-pay | Admitting: Family Medicine

## 2021-11-17 DIAGNOSIS — Z1231 Encounter for screening mammogram for malignant neoplasm of breast: Secondary | ICD-10-CM

## 2021-11-27 ENCOUNTER — Other Ambulatory Visit (HOSPITAL_COMMUNITY): Payer: Self-pay

## 2021-11-27 ENCOUNTER — Encounter: Payer: 59 | Admitting: Gastroenterology

## 2021-11-27 DIAGNOSIS — M170 Bilateral primary osteoarthritis of knee: Secondary | ICD-10-CM | POA: Diagnosis not present

## 2021-11-28 ENCOUNTER — Other Ambulatory Visit (HOSPITAL_COMMUNITY): Payer: Self-pay

## 2021-11-30 ENCOUNTER — Encounter: Payer: Self-pay | Admitting: Gastroenterology

## 2021-12-19 ENCOUNTER — Other Ambulatory Visit: Payer: Self-pay | Admitting: Family Medicine

## 2021-12-19 ENCOUNTER — Other Ambulatory Visit (HOSPITAL_COMMUNITY): Payer: Self-pay

## 2021-12-19 DIAGNOSIS — E119 Type 2 diabetes mellitus without complications: Secondary | ICD-10-CM

## 2021-12-20 ENCOUNTER — Other Ambulatory Visit (HOSPITAL_COMMUNITY): Payer: Self-pay

## 2021-12-21 ENCOUNTER — Other Ambulatory Visit (HOSPITAL_COMMUNITY): Payer: Self-pay

## 2021-12-21 MED ORDER — EMPAGLIFLOZIN 25 MG PO TABS
ORAL_TABLET | Freq: Every day | ORAL | 0 refills | Status: DC
  Filled 2021-12-21: qty 30, 30d supply, fill #0

## 2021-12-29 ENCOUNTER — Other Ambulatory Visit: Payer: Self-pay | Admitting: Family Medicine

## 2021-12-29 ENCOUNTER — Telehealth: Payer: Self-pay | Admitting: Family Medicine

## 2021-12-29 ENCOUNTER — Other Ambulatory Visit (HOSPITAL_COMMUNITY): Payer: Self-pay

## 2021-12-29 ENCOUNTER — Encounter: Payer: Self-pay | Admitting: Family Medicine

## 2021-12-29 DIAGNOSIS — E119 Type 2 diabetes mellitus without complications: Secondary | ICD-10-CM

## 2021-12-29 MED ORDER — EMPAGLIFLOZIN 25 MG PO TABS
ORAL_TABLET | Freq: Every day | ORAL | 3 refills | Status: DC
  Filled 2021-12-29: qty 90, fill #0
  Filled 2022-01-15: qty 90, 90d supply, fill #0

## 2021-12-29 NOTE — Telephone Encounter (Signed)
Can patient do a video visit for diabetes check up ?

## 2021-12-29 NOTE — Telephone Encounter (Signed)
Called pt.  Meds have been renewed.

## 2022-01-03 ENCOUNTER — Telehealth: Payer: 59 | Admitting: Physician Assistant

## 2022-01-03 ENCOUNTER — Ambulatory Visit: Payer: 59

## 2022-01-03 ENCOUNTER — Other Ambulatory Visit (HOSPITAL_COMMUNITY): Payer: Self-pay

## 2022-01-03 ENCOUNTER — Other Ambulatory Visit: Payer: Self-pay

## 2022-01-03 DIAGNOSIS — J019 Acute sinusitis, unspecified: Secondary | ICD-10-CM | POA: Diagnosis not present

## 2022-01-03 DIAGNOSIS — B9789 Other viral agents as the cause of diseases classified elsewhere: Secondary | ICD-10-CM | POA: Diagnosis not present

## 2022-01-03 DIAGNOSIS — J029 Acute pharyngitis, unspecified: Secondary | ICD-10-CM | POA: Diagnosis not present

## 2022-01-03 MED ORDER — FLUTICASONE PROPIONATE 50 MCG/ACT NA SUSP: 2.0000 | Freq: Every day | NASAL | 0 refills | Status: DC

## 2022-01-03 MED ORDER — DOXYCYCLINE HYCLATE 100 MG PO TABS: 100.0000 mg | ORAL_TABLET | Freq: Two times a day (BID) | ORAL | 0 refills | Status: DC

## 2022-01-03 MED ORDER — CARESTART COVID-19 HOME TEST VI KIT
PACK | 0 refills | Status: DC
  Filled 2022-01-03: qty 4, 4d supply, fill #0

## 2022-01-03 NOTE — Progress Notes (Signed)
E-Visit for Sinus Problems  We are sorry that you are not feeling well.  Here is how we plan to help!  Based on what you have shared with me it looks like you have sinusitis.  Sinusitis is inflammation and infection in the sinus cavities of the head.  Based on your presentation I believe you most likely have Acute Viral Sinusitis.This is an infection most likely caused by a virus. There is not specific treatment for viral sinusitis other than to help you with the symptoms until the infection runs its course.  You may use an oral decongestant such as Mucinex D or if you have glaucoma or high blood pressure use plain Mucinex. Saline nasal spray help and can safely be used as often as needed for congestion, I have prescribed: Fluticasone nasal spray two sprays in each nostril once a day.  Please let us know if your COVID PCR test is negative. If symptoms are continuing to progress, we will go ahead and add on an antibiotic at that time.   Some authorities believe that zinc sprays or the use of Echinacea may shorten the course of your symptoms.  Sinus infections are not as easily transmitted as other respiratory infection, however we still recommend that you avoid close contact with loved ones, especially the very young and elderly.  Remember to wash your hands thoroughly throughout the day as this is the number one way to prevent the spread of infection!  Home Care: Only take medications as instructed by your medical team. Do not take these medications with alcohol. A steam or ultrasonic humidifier can help congestion.  You can place a towel over your head and breathe in the steam from hot water coming from a faucet. Avoid close contacts especially the very young and the elderly. Cover your mouth when you cough or sneeze. Always remember to wash your hands.  Get Help Right Away If: You develop worsening fever or sinus pain. You develop a severe head ache or visual changes. Your symptoms persist  after you have completed your treatment plan.  Make sure you Understand these instructions. Will watch your condition. Will get help right away if you are not doing well or get worse.   Thank you for choosing an e-visit.  Your e-visit answers were reviewed by a board certified advanced clinical practitioner to complete your personal care plan. Depending upon the condition, your plan could have included both over the counter or prescription medications.  Please review your pharmacy choice. Make sure the pharmacy is open so you can pick up prescription now. If there is a problem, you may contact your provider through [employer] and have the prescription routed to another pharmacy.  Your safety is important to [name]. If you have drug allergies check your prescription carefully.   For the next 24 hours you can use MyChart to ask questions about today's visit, request a non-urgent call back, or ask for a work or school excuse. You will get an email in the next two days asking about your experience. I hope that your e-visit has been valuable and will speed your recovery.

## 2022-01-03 NOTE — Addendum Note (Signed)
Addended by: Brunetta Jeans on: 01/03/2022 04:21 PM   Modules accepted: Orders

## 2022-01-03 NOTE — Progress Notes (Signed)
I have spent 5 minutes in review of e-visit questionnaire, review and updating patient chart, medical decision making and response to patient.   Nixxon Faria Cody Gabreal Worton, PA-C    

## 2022-01-05 ENCOUNTER — Other Ambulatory Visit: Payer: Self-pay | Admitting: Family Medicine

## 2022-01-05 ENCOUNTER — Encounter: Payer: Self-pay | Admitting: Gastroenterology

## 2022-01-05 ENCOUNTER — Telehealth: Payer: Self-pay | Admitting: Gastroenterology

## 2022-01-05 ENCOUNTER — Other Ambulatory Visit: Payer: 59

## 2022-01-05 DIAGNOSIS — E1169 Type 2 diabetes mellitus with other specified complication: Secondary | ICD-10-CM | POA: Diagnosis not present

## 2022-01-05 LAB — NOVEL CORONAVIRUS, NAA: SARS-CoV-2, NAA: NOT DETECTED

## 2022-01-05 LAB — SARS-COV-2, NAA 2 DAY TAT

## 2022-01-05 NOTE — Telephone Encounter (Signed)
Good morning Dr. Fuller Plan, patient called stating she is not feeling well so she rescheduled her procedure from 01/08/22 to 02/26/22.

## 2022-01-06 LAB — HEMOGLOBIN A1C
Est. average glucose Bld gHb Est-mCnc: 166 mg/dL
Hgb A1c MFr Bld: 7.4 % — ABNORMAL HIGH (ref 4.8–5.6)

## 2022-01-08 ENCOUNTER — Other Ambulatory Visit: Payer: Self-pay | Admitting: Family Medicine

## 2022-01-08 ENCOUNTER — Other Ambulatory Visit (HOSPITAL_COMMUNITY): Payer: Self-pay

## 2022-01-08 ENCOUNTER — Encounter: Payer: Self-pay | Admitting: Family Medicine

## 2022-01-08 ENCOUNTER — Encounter: Payer: 59 | Admitting: Gastroenterology

## 2022-01-08 DIAGNOSIS — E1165 Type 2 diabetes mellitus with hyperglycemia: Secondary | ICD-10-CM

## 2022-01-08 DIAGNOSIS — Z1231 Encounter for screening mammogram for malignant neoplasm of breast: Secondary | ICD-10-CM

## 2022-01-08 MED ORDER — TIRZEPATIDE 10 MG/0.5ML ~~LOC~~ SOAJ
10.0000 mg | SUBCUTANEOUS | 3 refills | Status: DC
  Filled 2022-01-08: qty 2, 28d supply, fill #0
  Filled 2022-01-31: qty 2, 28d supply, fill #1
  Filled 2022-02-27: qty 2, 28d supply, fill #2

## 2022-01-09 ENCOUNTER — Other Ambulatory Visit (HOSPITAL_COMMUNITY): Payer: Self-pay

## 2022-01-12 ENCOUNTER — Other Ambulatory Visit (HOSPITAL_COMMUNITY): Payer: Self-pay

## 2022-01-15 ENCOUNTER — Other Ambulatory Visit (HOSPITAL_COMMUNITY): Payer: Self-pay

## 2022-01-15 DIAGNOSIS — M170 Bilateral primary osteoarthritis of knee: Secondary | ICD-10-CM | POA: Diagnosis not present

## 2022-01-17 ENCOUNTER — Other Ambulatory Visit (HOSPITAL_COMMUNITY): Payer: Self-pay

## 2022-01-17 ENCOUNTER — Other Ambulatory Visit: Payer: Self-pay | Admitting: Family Medicine

## 2022-01-18 ENCOUNTER — Other Ambulatory Visit (HOSPITAL_COMMUNITY): Payer: Self-pay

## 2022-01-18 MED ORDER — ROSUVASTATIN CALCIUM 5 MG PO TABS
ORAL_TABLET | Freq: Every day | ORAL | 0 refills | Status: DC
Start: 2022-01-18 — End: 2022-05-14
  Filled 2022-01-18: qty 90, 90d supply, fill #0

## 2022-01-19 ENCOUNTER — Other Ambulatory Visit (HOSPITAL_COMMUNITY): Payer: Self-pay

## 2022-01-22 DIAGNOSIS — M170 Bilateral primary osteoarthritis of knee: Secondary | ICD-10-CM | POA: Diagnosis not present

## 2022-01-22 DIAGNOSIS — Z1231 Encounter for screening mammogram for malignant neoplasm of breast: Secondary | ICD-10-CM

## 2022-01-29 DIAGNOSIS — M170 Bilateral primary osteoarthritis of knee: Secondary | ICD-10-CM | POA: Diagnosis not present

## 2022-01-31 ENCOUNTER — Other Ambulatory Visit (HOSPITAL_COMMUNITY): Payer: Self-pay

## 2022-01-31 ENCOUNTER — Telehealth: Payer: 59 | Admitting: Family Medicine

## 2022-02-05 ENCOUNTER — Other Ambulatory Visit: Payer: Self-pay

## 2022-02-05 ENCOUNTER — Ambulatory Visit
Admission: RE | Admit: 2022-02-05 | Discharge: 2022-02-05 | Disposition: A | Discharge status: Home / Self Care | Payer: 59 | Source: Ambulatory Visit

## 2022-02-05 DIAGNOSIS — Z1231 Encounter for screening mammogram for malignant neoplasm of breast: Secondary | ICD-10-CM | POA: Diagnosis not present

## 2022-02-19 ENCOUNTER — Ambulatory Visit (AMBULATORY_SURGERY_CENTER): Payer: 59 | Admitting: *Deleted

## 2022-02-19 ENCOUNTER — Other Ambulatory Visit: Payer: Self-pay

## 2022-02-19 VITALS — Ht 66.0 in | Wt 234.0 lb

## 2022-02-19 DIAGNOSIS — Z86010 Personal history of colonic polyps: Secondary | ICD-10-CM

## 2022-02-19 DIAGNOSIS — Z800 Family history of malignant neoplasm of digestive organs: Secondary | ICD-10-CM

## 2022-02-19 NOTE — Progress Notes (Signed)
No egg or soy allergy known to patient  No issues known to pt with past sedation with any surgeries or procedures Patient denies ever being told they had issues or difficulty with intubation  No FH of Malignant Hyperthermia Pt is not on diet pills Pt is not on  home 02  Pt is not on blood thinners  Pt denies issues with constipation  No A fib or A flutter  Pt is fully vaccinated  for Covid  Pt has a SUprep kit at home   Due to the COVID-19 pandemic we are asking patients to follow certain guidelines in PV and the [place]   Pt aware of COVID protocols and [facility] guidelines   PV completed over the phone. Pt verified name, DOB, address and insurance during PV today.   Pt encouraged to call with questions or issues.  If pt has My chart, procedure instructions sent via My Chart

## 2022-02-20 ENCOUNTER — Encounter: Payer: Self-pay | Admitting: Gastroenterology

## 2022-02-26 ENCOUNTER — Ambulatory Visit: Payer: 59 | Admitting: Family Medicine

## 2022-02-26 ENCOUNTER — Ambulatory Visit (AMBULATORY_SURGERY_CENTER): Payer: 59 | Admitting: Gastroenterology

## 2022-02-26 ENCOUNTER — Other Ambulatory Visit: Payer: Self-pay

## 2022-02-26 ENCOUNTER — Encounter: Payer: Self-pay | Admitting: Gastroenterology

## 2022-02-26 VITALS — BP 112/86 | HR 88 | Temp 97.8°F | Resp 15 | Ht 66.5 in | Wt 234.0 lb

## 2022-02-26 DIAGNOSIS — Z86010 Personal history of colonic polyps: Secondary | ICD-10-CM

## 2022-02-26 DIAGNOSIS — Z1211 Encounter for screening for malignant neoplasm of colon: Secondary | ICD-10-CM | POA: Diagnosis not present

## 2022-02-26 DIAGNOSIS — Z800 Family history of malignant neoplasm of digestive organs: Secondary | ICD-10-CM | POA: Diagnosis not present

## 2022-02-26 DIAGNOSIS — G4733 Obstructive sleep apnea (adult) (pediatric): Secondary | ICD-10-CM | POA: Diagnosis not present

## 2022-02-26 DIAGNOSIS — E119 Type 2 diabetes mellitus without complications: Secondary | ICD-10-CM | POA: Diagnosis not present

## 2022-02-26 MED ORDER — SODIUM CHLORIDE 0.9 % IV SOLN: 500.0000 mL | Freq: Once | INTRAVENOUS | Status: DC

## 2022-02-26 NOTE — Progress Notes (Signed)
History & Physical  Primary Care Physician:  Janora Norlander, DO Primary Gastroenterologist: Lucio Edward, MD  CHIEF COMPLAINT:  [facility]  HPI: Laura Valencia is a 61 y.o. female with a family history of colon cancer in a first-degree relative and a remote personal history of colon polyps here for colonoscopy.    Past Medical History:  Diagnosis Date   Allergy    seasonal   Arthritis    knees   Colon polyp    TYPE UNKNOWN   Diabetes mellitus, type 2 ([place])    Hyperthyroidism    HX; RX IN THE PAST   OSA (obstructive sleep apnea)    [facility] 06-19-2010 AHI 9.3/HR- wears cpap   Osteoarthritis    Seasonal rhinitis    MILD   Sleep apnea    occasional uses CPAP    Past Surgical History:  Procedure Laterality Date   BREAST EXCISIONAL BIOPSY Left 10+ years ago   intraductal papilloma per pt   CARPAL TUNNEL RELEASE     BILATERAL   COLONOSCOPY  11/2010   [facility] Massman poylps   POLYPECTOMY     TRIGGER FINGER RELEASE Left    middle finger    Prior to Admission medications   Medication Sig Start Date End Date Taking? Authorizing Provider  aspirin 81 MG tablet Take 81 mg by mouth daily.   Yes [provider]  blood glucose meter kit and supplies Dispense based on patient and insurance preference. Use to check blood sugar daily . (FOR ICD-10 E10.9, E11.9). 06/14/20  Yes Lovena Le, Malena M, DO  cycloSPORINE (RESTASIS) 0.05 % ophthalmic emulsion Place 1 drop into both eyes 2 (two) times daily. 07/27/21  Yes   empagliflozin (JARDIANCE) 25 MG TABS tablet TAKE 1 TABLET BY MOUTH DAILY BEFORE BREAKFAST 12/29/21  Yes Gottschalk, Ashly M, DO  glucose blood test strip Use 1 strip to check blood glucose once daily. 04/27/21  Yes Taylor, Malena M, DO  rosuvastatin (CRESTOR) 5 MG tablet TAKE 1 TABLET (5 MG TOTAL) BY MOUTH DAILY. 01/18/22 01/18/23 Yes Gottschalk, Leatrice Jewels M, DO  tirzepatide [facility]) 10 MG/0.5ML Pen Inject 10 mg into the skin once a week. 01/08/22  Yes Janora Norlander, DO   COVID-19 At Home Antigen Test [facility] COVID-19 HOME TEST) KIT Use as directed Patient not taking: Reported on 02/26/2022 01/03/22   Edmon Crape, RPH  fluticasone [facility]) 50 MCG/ACT nasal spray Place 2 sprays into both nostrils daily. Patient not taking: Reported on 02/26/2022 01/03/22   Brunetta Jeans, PA-C    Current Outpatient Medications  Medication Sig Dispense Refill   aspirin 81 MG tablet Take 81 mg by mouth daily.     blood glucose meter kit and supplies Dispense based on patient and insurance preference. Use to check blood sugar daily . (FOR ICD-10 E10.9, E11.9). 1 each 0   cycloSPORINE (RESTASIS) 0.05 % ophthalmic emulsion Place 1 drop into both eyes 2 (two) times daily. 10800 each 4   empagliflozin (JARDIANCE) 25 MG TABS tablet TAKE 1 TABLET BY MOUTH DAILY BEFORE BREAKFAST 90 tablet 3   glucose blood test strip Use 1 strip to check blood glucose once daily. 50 each 4   rosuvastatin (CRESTOR) 5 MG tablet TAKE 1 TABLET (5 MG TOTAL) BY MOUTH DAILY. 90 tablet 0   tirzepatide (MOUNJARO) 10 MG/0.5ML Pen Inject 10 mg into the skin once a week. 6 mL 3   COVID-19 At Home Antigen Test ([facility] COVID-19 HOME TEST) KIT Use as directed (Patient not  taking: Reported on 02/26/2022) 4 each 0   fluticasone (FLONASE) 50 MCG/ACT nasal spray Place 2 sprays into both nostrils daily. (Patient not taking: Reported on 02/26/2022) 16 g 0   Current Facility-Administered Medications  Medication Dose Route Frequency Provider Last Rate Last Admin   0.9 %  sodium chloride infusion  500 mL Intravenous Once Ladene Artist, MD        Allergies as of 02/26/2022   (No Known Allergies)    Family History  Problem Relation Age of Onset   Colon cancer Mother 24   Esophageal cancer Neg Hx    Stomach cancer Neg Hx    Rectal cancer Neg Hx    Colon polyps Neg Hx     Social History   Socioeconomic History   Marital status: Married    Spouse name: Not on file   Number of children: 2   Years of  education: Not on file   Highest education level: Not on file  Occupational History   Occupation: MD    Employer: [employer]    Comment: FAMILY [employer]ICINE  Tobacco Use   Smoking status: Never   Smokeless tobacco: Never  Vaping Use   Vaping Use: Never used  Substance and Sexual Activity   Alcohol use: Yes    Comment: RARE USE OF ETOH   Drug use: No   Sexual activity: Yes    Birth control/protection: Post-menopausal  Other Topics Concern   Not on file  Social History Narrative   [place].   Social Determinants of Health   Financial Resource Strain: Not on file  Food Insecurity: Not on file  Transportation Needs: Not on file  Physical Activity: Not on file  Stress: Not on file  Social Connections: Not on file  Intimate Partner Violence: Not on file    Review of Systems:  All systems reviewed an negative except where noted in HPI.  Gen: Denies any fever, chills, sweats, anorexia, fatigue, weakness, malaise, weight loss, and sleep disorder CV: Denies chest pain, angina, palpitations, syncope, orthopnea, PND, peripheral edema, and claudication. Resp: Denies dyspnea at rest, dyspnea with exercise, cough, sputum, wheezing, coughing up blood, and pleurisy. GI: Denies vomiting blood, jaundice, and fecal incontinence.   Denies dysphagia or odynophagia. GU : Denies urinary burning, blood in urine, urinary frequency, urinary hesitancy, nocturnal urination, and urinary incontinence. MS: Denies joint pain, limitation of movement, and swelling, stiffness, low back pain, extremity pain. Denies muscle weakness, cramps, atrophy.  Derm: Denies rash, itching, dry skin, hives, moles, warts, or unhealing ulcers.  Psych: Denies depression, anxiety, memory loss, suicidal ideation, hallucinations, paranoia, and confusion. Heme: Denies bruising, bleeding, and enlarged lymph nodes. Neuro:  Denies any headaches, dizziness, paresthesias. Endo:  Denies any problems with  DM, thyroid, adrenal function.   Physical Exam: General:  Alert, well-developed, in NAD Head:  Normocephalic and atraumatic. Eyes:  Sclera clear, no icterus.   Conjunctiva pink. Ears:  Normal auditory acuity. Mouth:  No deformity or lesions.  Neck:  Supple; no masses . Lungs:  Clear throughout to auscultation.   No wheezes, crackles, or rhonchi. No acute distress. Heart:  Regular rate and rhythm; no murmurs. Abdomen:  Soft, nondistended, nontender. No masses, hepatomegaly. No obvious masses.  Normal bowel .    Rectal:  Deferred   Msk:  Symmetrical without gross deformities.. Pulses:  Normal pulses noted. Extremities:  Without edema. Neurologic:  Alert and  oriented x4;  grossly normal neurologically. Skin:  Intact without  significant lesions or rashes. Cervical Nodes:  No significant cervical adenopathy. Psych:  Alert and cooperative. Normal mood and affect.   Impression / Plan:   Family history of colon cancer in a first-degree relative and a remote personal history of colon polyps here for colonoscopy.  Pricilla Riffle. Fuller Plan  02/26/2022, 9:27 AM See Shea Evans, [facility], to contact our on call provider

## 2022-02-26 NOTE — Progress Notes (Signed)
Report given to PACU, vss 

## 2022-02-26 NOTE — Op Note (Signed)
Center Patient Name: Laura Valencia Procedure Date: 02/26/2022 9:24 AM MRN: [id number] Endoscopist: Ladene Artist , MD Age: 61 Referring MD:  Date of Birth: May 11, 1961 Gender: Female Account #: [id number] Procedure:                Colonoscopy Indications:              Screening in patient at increased risk: Family                            history of 1st-degree relative with colorectal                            cancer Medicines:                Monitored Anesthesia Care Procedure:                Pre-Anesthesia Assessment:                           - Prior to the procedure, a History and Physical                            was performed, and patient medications and                            allergies were reviewed. The patient's tolerance of                            previous anesthesia was also reviewed. The risks                            and benefits of the procedure and the sedation                            options and risks were discussed with the patient.                            All questions were answered, and informed consent                            was obtained. Prior Anticoagulants: The patient has                            taken no previous anticoagulant or antiplatelet                            agents. ASA Grade Assessment: II - A patient with                            mild systemic disease. After reviewing the risks                            and benefits, the patient was deemed in  satisfactory condition to undergo the procedure.                           After obtaining informed consent, the colonoscope                            was passed under direct vision. Throughout the                            procedure, the patient's blood pressure, pulse, and                            oxygen saturations were monitored continuously. The                            CF [id number] #[id number] was introduced through the anus                             and advanced to the the cecum, identified by                            appendiceal orifice and ileocecal valve. The                            ileocecal valve, appendiceal orifice, and rectum                            were photographed. The quality of the bowel                            preparation was adequate after extensive lavage,                            suction. The colonoscopy was performed without                            difficulty. The patient tolerated the procedure                            well. Scope In: 9:32:57 AM Scope Out: 9:48:38 AM Scope Withdrawal Time: 0 hours 9 minutes 8 seconds  Total Procedure Duration: 0 hours 15 minutes 41 seconds  Findings:                 The perianal and digital rectal examinations were                            normal.                           The entire examined colon appeared normal on direct                            and retroflexion views. Complications:            No immediate complications. Estimated blood loss:  None. Estimated Blood Loss:     Estimated blood loss: none. Impression:               - Normal appearing colonoscopy on direct and                            retroflexion views.                           - No specimens collected. Recommendation:           - Repeat colonoscopy in 5 years for screening                            purposes with a more extensive bowel prep.                           - Patient has a contact number available for                            emergencies. The signs and symptoms of potential                            delayed complications were discussed with the                            patient. Return to normal activities tomorrow.                            Written discharge instructions were provided to the                            patient.                           - Resume previous diet.                           - Continue present  medications. Ladene Artist, MD 02/26/2022 9:56:21 AM This report has been signed electronically.

## 2022-02-26 NOTE — Patient Instructions (Signed)
Repeat colonoscopy in 5 years for screening purposes with extensive bowel prep.  Resume previous diet and continue current medications.  YOU HAD AN ENDOSCOPIC PROCEDURE TODAY AT [facility]:   Refer to the procedure report that was given to you for any specific questions about what was found during the examination.  If the procedure report does not answer your questions, please call your gastroenterologist to clarify.  If you requested that your care partner not be given the details of your procedure findings, then the procedure report has been included in a sealed envelope for you to review at your convenience later.  YOU SHOULD EXPECT: Some feelings of bloating in the abdomen. Passage of more gas than usual.  Walking can help get rid of the air that was put into your GI tract during the procedure and reduce the bloating. If you had a lower endoscopy (such as a colonoscopy or flexible sigmoidoscopy) you may notice spotting of blood in your stool or on the toilet paper. If you underwent a bowel prep for your procedure, you may not have a normal bowel movement for a few days.  Please Note:  You might notice some irritation and congestion in your nose or some drainage.  This is from the oxygen used during your procedure.  There is no need for concern and it should clear up in a day or so.  SYMPTOMS TO REPORT IMMEDIATELY:  Following lower endoscopy (colonoscopy or flexible sigmoidoscopy):  Excessive amounts of blood in the stool  Significant tenderness or worsening of abdominal pains  Swelling of the abdomen that is new, acute  Fever of 100F or higher  For urgent or emergent issues, a gastroenterologist can be reached at any hour by calling [phone]. Do not use MyChart messaging for urgent concerns.    DIET:  We do recommend a small meal at first, but then you may proceed to your regular diet.  Drink plenty of fluids but you should avoid alcoholic beverages for 24  hours.  ACTIVITY:  You should plan to take it easy for the rest of today and you should NOT DRIVE or use heavy machinery until tomorrow (because of the sedation medicines used during the test).    FOLLOW UP: Our staff will call the number listed on your records 48-72 hours following your procedure to check on you and address any questions or concerns that you may have regarding the information given to you following your procedure. If we do not reach you, we will leave a message.  We will attempt to reach you two times.  During this call, we will ask if you have developed any symptoms of COVID 19. If you develop any symptoms (ie: fever, flu-like symptoms, shortness of breath, cough etc.) before then, please call [phone].  If you test positive for Covid 19 in the 2 weeks post procedure, please call and report this information to [name].    If any biopsies were taken you will be contacted by phone or by letter within the next 1-3 weeks.  Please call us at [phone] if you have not heard about the biopsies in 3 weeks.    SIGNATURES/CONFIDENTIALITY: You and/or your care partner have signed paperwork which will be entered into your electronic medical record.  These signatures attest to the fact that that the information above on your After Visit Summary has been reviewed and is understood.  Full responsibility of the confidentiality of this discharge information lies with you and/or your  care-partner.

## 2022-02-26 NOTE — Progress Notes (Signed)
Pt's states no medical or surgical changes since previsit or office visit. VS assessed by C.W 

## 2022-02-27 ENCOUNTER — Other Ambulatory Visit (HOSPITAL_COMMUNITY): Payer: Self-pay

## 2022-02-28 ENCOUNTER — Telehealth: Payer: Self-pay

## 2022-02-28 NOTE — Telephone Encounter (Signed)
?  Follow up Call- ? ?Call back number 02/26/2022  ?Post procedure Call Back phone  # [phone]  ?Permission to leave phone message Yes  ?Some recent data might be hidden  ?  ? ?Patient questions: ? ?Do you have a fever, pain , or abdominal swelling? No. ?Pain Score  0 * ? ?Have you tolerated food without any problems? Yes.   ? ?Have you been able to return to your normal activities? Yes.   ? ?Do you have any questions about your discharge instructions: ?Diet   No. ?Medications  No. ?Follow up visit  No. ? ?Do you have questions or concerns about your Care? No. ? ?Actions: ?* If pain score is 4 or above: ?No action needed, pain <4. ? ? ? ?Have you developed a fever since your procedure? no ? ?2.   Have you had an respiratory symptoms (SOB or cough) since your procedure? no ? ?3.   Have you tested positive for COVID 19 since your procedure no ? ?4.   Have you had any family members/close contacts diagnosed with the COVID 19 since your procedure?  no ? ? ?If yes to any of these questions please route to Joylene John, RN and Joella Prince, RN ? ? ? ? ? ?

## 2022-03-01 DIAGNOSIS — D23121 Other benign neoplasm of skin of left upper eyelid, including canthus: Secondary | ICD-10-CM | POA: Diagnosis not present

## 2022-03-01 DIAGNOSIS — D23122 Other benign neoplasm of skin of left lower eyelid, including canthus: Secondary | ICD-10-CM | POA: Diagnosis not present

## 2022-03-01 DIAGNOSIS — D23111 Other benign neoplasm of skin of right upper eyelid, including canthus: Secondary | ICD-10-CM | POA: Diagnosis not present

## 2022-03-01 DIAGNOSIS — D23112 Other benign neoplasm of skin of right lower eyelid, including canthus: Secondary | ICD-10-CM | POA: Diagnosis not present

## 2022-03-12 ENCOUNTER — Ambulatory Visit: Payer: 59 | Admitting: Family Medicine

## 2022-03-21 ENCOUNTER — Other Ambulatory Visit (HOSPITAL_COMMUNITY): Payer: Self-pay

## 2022-03-21 MED ORDER — NEOMYCIN-POLYMYXIN-DEXAMETH 3.5-10000-0.1 OP OINT
TOPICAL_OINTMENT | OPHTHALMIC | 0 refills | Status: DC
Start: 2022-03-21 — End: 2022-05-14
  Filled 2022-03-21: qty 3.5, 3d supply, fill #0

## 2022-03-23 ENCOUNTER — Other Ambulatory Visit: Payer: Self-pay | Admitting: Family Medicine

## 2022-03-23 ENCOUNTER — Ambulatory Visit: Payer: 59 | Admitting: Family

## 2022-03-23 ENCOUNTER — Encounter: Payer: Self-pay | Admitting: Family

## 2022-03-23 ENCOUNTER — Other Ambulatory Visit: Payer: Self-pay

## 2022-03-23 VITALS — BP 90/52 | HR 70 | Temp 97.8°F | Ht 66.5 in | Wt 234.4 lb

## 2022-03-23 DIAGNOSIS — G4733 Obstructive sleep apnea (adult) (pediatric): Secondary | ICD-10-CM

## 2022-03-23 DIAGNOSIS — Z01818 Encounter for other preprocedural examination: Secondary | ICD-10-CM

## 2022-03-23 DIAGNOSIS — Z23 Encounter for immunization: Secondary | ICD-10-CM

## 2022-03-23 DIAGNOSIS — E1169 Type 2 diabetes mellitus with other specified complication: Secondary | ICD-10-CM | POA: Diagnosis not present

## 2022-03-23 DIAGNOSIS — E1165 Type 2 diabetes mellitus with hyperglycemia: Secondary | ICD-10-CM

## 2022-03-23 NOTE — Patient Instructions (Signed)

## 2022-03-23 NOTE — Progress Notes (Signed)
? ?  Subjective:  ? ? Patient ID: Laura Valencia, female    DOB: May 16, 1961, 61 y.o.   MRN: [id number] ? ?Chief Complaint  ?Patient presents with  ? surgical clearance   ? ?Pt presents to the office today for surgical clearance for removal of cyst on her face. It is scheduled for 03/28/22.  ? ?She has DM, morbid obese, and OSA. She wears a CPAP nightly. Her last 7.4.  ?Diabetes ?She presents for her follow-up diabetic visit. She has type 2 diabetes mellitus. Pertinent negatives for diabetes include no blurred vision and no foot paresthesias. Risk factors for coronary artery disease include dyslipidemia, diabetes mellitus and sedentary lifestyle.  ? ? ? ?Review of Systems  ?Eyes:  Negative for blurred vision.  ?All other systems reviewed and are negative. ? ?   ?Objective:  ? Physical Exam ?Vitals reviewed.  ?Constitutional:   ?   General: She is not in acute distress. ?   Appearance: She is well-developed. She is obese.  ?HENT:  ?   Head: Normocephalic and atraumatic.  ?Eyes:  ?   Pupils: Pupils are equal, round, and reactive to light.  ?Neck:  ?   Thyroid: No thyromegaly.  ?Cardiovascular:  ?   Rate and Rhythm: Normal rate and regular rhythm.  ?   Heart sounds: Normal heart sounds. No murmur heard. ?Pulmonary:  ?   Effort: Pulmonary effort is normal. No respiratory distress.  ?   Breath sounds: Normal breath sounds. No wheezing.  ?Abdominal:  ?   General: Bowel sounds are normal. There is no distension.  ?   Palpations: Abdomen is soft.  ?   Tenderness: There is no abdominal tenderness.  ?Musculoskeletal:     ?   General: No tenderness. Normal range of motion.  ?   Cervical back: Normal range of motion and neck supple.  ?Skin: ?   General: Skin is warm and dry.  ? ?    ?   Comments: Cluster of small cysts under left eye  ?Neurological:  ?   Mental Status: She is alert and oriented to person, place, and time.  ?   Cranial Nerves: No cranial nerve deficit.  ?   Deep Tendon Reflexes: Reflexes are normal and  symmetric.  ?Psychiatric:     ?   Behavior: Behavior normal.     ?   Thought Content: Thought content normal.     ?   Judgment: Judgment normal.  ? ? ? ? ? ?  BP (!) 90/52   Pulse 70   Temp 97.8 ?F (36.6 ?C) (Temporal)   Ht 5' 6.5" (1.689 m)   Wt 234 lb 6.4 oz (106.3 kg)   LMP 03/19/2012   BMI 37.27 kg/m?  ? ?Assessment & Plan:  ?Laura Valencia comes in today with chief complaint of surgical clearance  ? ? ?Diagnosis and orders addressed: ? ?1. Pre-op evaluation ? ?2. Type 2 diabetes mellitus with other specified complication, without long-term current use of insulin ([place]) ? ?3. OSA (obstructive sleep apnea) ? ?4. Need for shingles vaccine ?- Varicella-zoster vaccine IM (Shingrix) ? ? ?Labs pending ?Health Maintenance reviewed ?Diet and exercise encouraged ? ?Follow up plan: ?Keep chronic follow up with PCP ? ? ?Evelina Dun, FNP ? ? ?

## 2022-03-26 DIAGNOSIS — E1165 Type 2 diabetes mellitus with hyperglycemia: Secondary | ICD-10-CM | POA: Diagnosis not present

## 2022-03-26 LAB — BAYER DCA HB A1C WAIVED: HB A1C (BAYER DCA - WAIVED): 6.8 % — ABNORMAL HIGH (ref 4.8–5.6)

## 2022-03-27 ENCOUNTER — Other Ambulatory Visit (HOSPITAL_COMMUNITY): Payer: Self-pay

## 2022-03-27 ENCOUNTER — Ambulatory Visit (INDEPENDENT_AMBULATORY_CARE_PROVIDER_SITE_OTHER): Payer: 59 | Admitting: Family Medicine

## 2022-03-27 DIAGNOSIS — E1169 Type 2 diabetes mellitus with other specified complication: Secondary | ICD-10-CM | POA: Diagnosis not present

## 2022-03-27 MED ORDER — EMPAGLIFLOZIN 10 MG PO TABS
10.0000 mg | ORAL_TABLET | Freq: Every day | ORAL | 3 refills | Status: DC
  Filled 2022-03-27: qty 90, 90d supply, fill #0

## 2022-03-27 MED ORDER — TIRZEPATIDE 12.5 MG/0.5ML ~~LOC~~ SOAJ
12.5000 mg | SUBCUTANEOUS | 0 refills | Status: DC
  Filled 2022-03-27: qty 6, 84d supply, fill #0

## 2022-03-27 MED ORDER — TIRZEPATIDE 15 MG/0.5ML ~~LOC~~ SOAJ
15.0000 mg | SUBCUTANEOUS | 99 refills | Status: DC
  Filled 2022-03-27 – 2022-05-14 (×2): qty 6, 84d supply, fill #0
  Filled 2022-08-02: qty 2, 28d supply, fill #1
  Filled 2022-08-28: qty 2, 28d supply, fill #2
  Filled 2022-10-02: qty 2, 28d supply, fill #3
  Filled 2022-11-12: qty 2, 28d supply, fill #4
  Filled 2022-12-19: qty 2, 28d supply, fill #5
  Filled 2023-01-11: qty 2, 28d supply, fill #6
  Filled 2023-01-28: qty 2, 28d supply, fill #7
  Filled 2023-03-07: qty 2, 28d supply, fill #8

## 2022-03-27 NOTE — Progress Notes (Signed)
Telephone visit ? ?Subjective: ?CC:DM follow up ?PCP: Janora Norlander, DO ?[id number] Laura Valencia is a 61 y.o. female calls for telephone consult today. Patient provides verbal consent for consult held via phone. ? ?Due to COVID-19 pandemic this visit was conducted virtually. This visit type was conducted due to national recommendations for restrictions regarding the COVID-19 Pandemic (e.g. social distancing, sheltering in place) in an effort to limit this patient's exposure and mitigate transmission in our community. All issues noted in this document were discussed and addressed.  A physical exam was not performed with this format.  ? ?Location of patient: home ?Location of provider: [facility] ?Others present for call: none ? ?1. DM ?Patient had A1c checked recently and it was within normal range.  She is tolerating the [facility] without any difficulty.  Wishes to escalate dose in order to promote better controlled blood sugar and weight loss.  She has had some weight loss with it but is really not at the goal yet.  She continues to struggle with knee pain and knows that this is directly related to her weight.  Reports some mild vaginitis.  Thinks this may be due to the dose of Jardiance and is willing to go down on dose if possible.  She is enrolling in [employer].  She has this week off and plans to really try and get a hold of her health.  Currently taking care of her father, who was recently hospitalized. ? ? ?ROS: Per HPI ? ?No Known Allergies ?Past Medical History:  ?Diagnosis Date  ? Allergy   ? seasonal  ? Arthritis   ? knees  ? Colon polyp   ? TYPE UNKNOWN  ? Diabetes mellitus, type 2 ([place])   ? Hyperthyroidism   ? HX; RX IN THE PAST  ? OSA (obstructive sleep apnea)   ? [facility] 06-19-2010 AHI 9.3/HR- wears cpap  ? Osteoarthritis   ? Seasonal rhinitis   ? MILD  ? Sleep apnea   ? occasional uses CPAP  ? ? ?Current Outpatient Medications:  ?  aspirin 81 MG tablet, Take 81 mg by mouth daily., Disp: ,  Rfl:  ?  blood glucose meter kit and supplies, Dispense based on patient and insurance preference. Use to check blood sugar daily . (FOR ICD-10 E10.9, E11.9)., Disp: 1 each, Rfl: 0 ?  COVID-19 At Home Antigen Test [facility] COVID-19 HOME TEST) KIT, Use as directed (Patient not taking: Reported on 02/26/2022), Disp: 4 each, Rfl: 0 ?  cycloSPORINE (RESTASIS) 0.05 % ophthalmic emulsion, Place 1 drop into both eyes 2 (two) times daily., Disp: 10800 each, Rfl: 4 ?  empagliflozin (JARDIANCE) 25 MG TABS tablet, TAKE 1 TABLET BY MOUTH DAILY BEFORE BREAKFAST, Disp: 90 tablet, Rfl: 3 ?  fluticasone (FLONASE) 50 MCG/ACT nasal spray, Place 2 sprays into both nostrils daily., Disp: 16 g, Rfl: 0 ?  glucose blood test strip, Use 1 strip to check blood glucose once daily., Disp: 50 each, Rfl: 4 ?  neomycin-polymyxin b-dexamethasone (MAXITROL) 3.5-10000-0.1 OINT, APPLY A SMALL AMOUNT ONTO SUTURES OR OPERATIVE SITE TWICE A DAY FOR THREE DAYS THEN STOP (Patient not taking: Reported on 03/23/2022), Disp: 3.5 g, Rfl: 0 ?  rosuvastatin (CRESTOR) 5 MG tablet, TAKE 1 TABLET (5 MG TOTAL) BY MOUTH DAILY., Disp: 90 tablet, Rfl: 0 ?  tirzepatide (MOUNJARO) 10 MG/0.5ML Pen, Inject 10 mg into the skin once a week., Disp: 6 mL, Rfl: 3 ? ?Assessment/ Plan: ?61 y.o. female  ? ?Type 2 diabetes mellitus with other  specified complication, without long-term current use of insulin ([place]) - Plan: tirzepatide (MOUNJARO) 12.5 MG/0.5ML Pen, tirzepatide (MOUNJARO) 15 MG/0.5ML Pen, empagliflozin (JARDIANCE) 10 MG TABS tablet ? ?Advance Mounjaro to 12.5 mg weekly.  I have placed 15 mg on file so that she may escalate at her convenience.  I have reduced the Jardiance 10 mg given reports of mild vaginitis.  Hopefully this will resolve that issue.  Continue to monitor blood sugars closely.  We will plan to follow-up in office in about 3 months for sugar and weight check ? ?Start time: 8:01am ?End time: 8:11am ? ?Total time spent on patient care (including telephone  call/ virtual visit): 10 minutes ? ?Janora Norlander, DO ?[place] ?[phone] ? ? ?

## 2022-03-28 ENCOUNTER — Other Ambulatory Visit: Payer: Self-pay

## 2022-03-28 ENCOUNTER — Ambulatory Visit: Payer: 59 | Admitting: Family Medicine

## 2022-03-28 DIAGNOSIS — H02125 Mechanical ectropion of left lower eyelid: Secondary | ICD-10-CM | POA: Diagnosis not present

## 2022-03-28 DIAGNOSIS — H04222 Epiphora due to insufficient drainage, left lacrimal gland: Secondary | ICD-10-CM | POA: Diagnosis not present

## 2022-03-28 DIAGNOSIS — H02135 Senile ectropion of left lower eyelid: Secondary | ICD-10-CM | POA: Diagnosis not present

## 2022-03-28 DIAGNOSIS — D23122 Other benign neoplasm of skin of left lower eyelid, including canthus: Secondary | ICD-10-CM | POA: Diagnosis not present

## 2022-04-23 DIAGNOSIS — M170 Bilateral primary osteoarthritis of knee: Secondary | ICD-10-CM | POA: Diagnosis not present

## 2022-05-09 ENCOUNTER — Telehealth: Payer: 59 | Admitting: Physician Assistant

## 2022-05-09 DIAGNOSIS — R3989 Other symptoms and signs involving the genitourinary system: Secondary | ICD-10-CM | POA: Diagnosis not present

## 2022-05-09 MED ORDER — CEPHALEXIN 500 MG PO CAPS: 500.0000 mg | ORAL_CAPSULE | Freq: Two times a day (BID) | ORAL | 0 refills | Status: DC

## 2022-05-09 NOTE — Progress Notes (Signed)

## 2022-05-10 ENCOUNTER — Encounter: Payer: Self-pay | Admitting: Family Medicine

## 2022-05-11 NOTE — Telephone Encounter (Signed)
Please put her on my schedule for video visit that is convenient for her schedule.  Ok to double book any other phone/video visit slots if needed.  I can put her on my night clinic and call today if she prefers that ?

## 2022-05-14 ENCOUNTER — Ambulatory Visit (INDEPENDENT_AMBULATORY_CARE_PROVIDER_SITE_OTHER): Payer: 59 | Admitting: Family Medicine

## 2022-05-14 ENCOUNTER — Other Ambulatory Visit (HOSPITAL_COMMUNITY): Payer: Self-pay

## 2022-05-14 DIAGNOSIS — E1165 Type 2 diabetes mellitus with hyperglycemia: Secondary | ICD-10-CM | POA: Diagnosis not present

## 2022-05-14 MED ORDER — SYNJARDY XR 10-1000 MG PO TB24
1.0000 | ORAL_TABLET | Freq: Every day | ORAL | 3 refills | Status: DC
  Filled 2022-05-14: qty 90, 90d supply, fill #0

## 2022-05-14 MED ORDER — ROSUVASTATIN CALCIUM 5 MG PO TABS
5.0000 mg | ORAL_TABLET | Freq: Every day | ORAL | 3 refills | Status: DC
  Filled 2022-05-14 – 2022-09-14 (×2): qty 90, 90d supply, fill #0
  Filled 2022-12-19: qty 90, 90d supply, fill #1

## 2022-05-14 NOTE — Progress Notes (Signed)
Telephone visit ? ?Subjective: ?CC:DM ?PCP: Janora Norlander, DO ?[id number] Laura Valencia is a 61 y.o. female calls for telephone consult today. Patient provides verbal consent for consult held via phone. ? ?Due to COVID-19 pandemic this visit was conducted virtually. This visit type was conducted due to national recommendations for restrictions regarding the COVID-19 Pandemic (e.g. social distancing, sheltering in place) in an effort to limit this patient's exposure and mitigate transmission in our community. All issues noted in this document were discussed and addressed.  A physical exam was not performed with this format.  ? ?Location of patient: work ?Location of provider: [facility] ?Others present for call: none ? ?1.  Type 2 diabetes ?Patient had an A1c of 6.8 back in March here.  However, she had her annual work labs done and her A1c had gone up to 7.2.  This caused her quite a bit of concern as she thought her blood sugar had been controlled on the Mounjaro.  She would like to advance her dose as well as add back metformin in efforts to promote better blood sugar control.  She is had no hypoglycemic episodes.  Her weight fluctuates around 230 pounds and she is still trying to work on diet and exercise but notes that this is limited secondary to knee issues. ? ? ?ROS: Per HPI ? ?No Known Allergies ?Past Medical History:  ?Diagnosis Date  ? Allergy   ? seasonal  ? Arthritis   ? knees  ? Colon polyp   ? TYPE UNKNOWN  ? Diabetes mellitus, type 2 ([place])   ? Hyperthyroidism   ? HX; RX IN THE PAST  ? OSA (obstructive sleep apnea)   ? [facility] 06-19-2010 AHI 9.3/HR- wears cpap  ? Osteoarthritis   ? Seasonal rhinitis   ? MILD  ? Sleep apnea   ? occasional uses CPAP  ? ? ?Current Outpatient Medications:  ?  aspirin 81 MG tablet, Take 81 mg by mouth daily., Disp: , Rfl:  ?  blood glucose meter kit and supplies, Dispense based on patient and insurance preference. Use to check blood sugar daily . (FOR ICD-10 E10.9,  E11.9)., Disp: 1 each, Rfl: 0 ?  cephALEXin (KEFLEX) 500 MG capsule, Take 1 capsule (500 mg total) by mouth 2 (two) times daily., Disp: 14 capsule, Rfl: 0 ?  COVID-19 At Home Antigen Test [facility] COVID-19 HOME TEST) KIT, Use as directed (Patient not taking: Reported on 02/26/2022), Disp: 4 each, Rfl: 0 ?  cycloSPORINE (RESTASIS) 0.05 % ophthalmic emulsion, Place 1 drop into both eyes 2 (two) times daily., Disp: 10800 each, Rfl: 4 ?  empagliflozin (JARDIANCE) 10 MG TABS tablet, Take 1 tablet (10 mg total) by mouth daily before breakfast., Disp: 90 tablet, Rfl: 3 ?  fluticasone (FLONASE) 50 MCG/ACT nasal spray, Place 2 sprays into both nostrils daily., Disp: 16 g, Rfl: 0 ?  glucose blood test strip, Use 1 strip to check blood glucose once daily., Disp: 50 each, Rfl: 4 ?  neomycin-polymyxin b-dexamethasone (MAXITROL) 3.5-10000-0.1 OINT, APPLY A SMALL AMOUNT ONTO SUTURES OR OPERATIVE SITE TWICE A DAY FOR THREE DAYS THEN STOP (Patient not taking: Reported on 03/23/2022), Disp: 3.5 g, Rfl: 0 ?  rosuvastatin (CRESTOR) 5 MG tablet, TAKE 1 TABLET (5 MG TOTAL) BY MOUTH DAILY., Disp: 90 tablet, Rfl: 0 ?  tirzepatide (MOUNJARO) 12.5 MG/0.5ML Pen, Inject 12.5 mg into the skin once a week., Disp: 6 mL, Rfl: 0 ?  tirzepatide (MOUNJARO) 15 MG/0.5ML Pen, Inject 15 mg into the skin once  a week. Place on file, Disp: 6 mL, Rfl: PRN ? ?Assessment/ Plan: ?61 y.o. female  ? ?Uncontrolled type 2 diabetes mellitus with hyperglycemia (HCC) - Plan: Hemoglobin A1c, Empagliflozin-metFORMIN HCl ER (SYNJARDY XR) 09-999 MG TB24, rosuvastatin (CRESTOR) 5 MG tablet, Hepatic function panel ? ?We will plan for venipuncture A1c at next visit as well as repeat LFTs because she noted these were slightly elevated on her most recent metabolic panel.  I have added metformin to her Jardiance in combination as Synjardy.  Uncertain of insurance will pay for this combo extended release or we will have to go to twice daily dosing.  I have renewed Crestor.  She  already has the 15 mg of Mounjaro available to her through the pharmacy so she just needs to call and ask for it. ? ?Start time: 10:08am ?End time: 10:15a ? ?Total time spent on patient care (including telephone call/ virtual visit): 7 minutes ? ?Janora Norlander, DO ?[place] ?[phone] ? ? ?

## 2022-05-15 ENCOUNTER — Other Ambulatory Visit (HOSPITAL_COMMUNITY): Payer: Self-pay

## 2022-06-05 ENCOUNTER — Other Ambulatory Visit (HOSPITAL_COMMUNITY): Payer: Self-pay

## 2022-06-06 ENCOUNTER — Other Ambulatory Visit (HOSPITAL_COMMUNITY): Payer: Self-pay

## 2022-06-13 DIAGNOSIS — G4733 Obstructive sleep apnea (adult) (pediatric): Secondary | ICD-10-CM | POA: Diagnosis not present

## 2022-06-20 ENCOUNTER — Other Ambulatory Visit: Payer: Self-pay | Admitting: *Deleted

## 2022-06-20 DIAGNOSIS — E1169 Type 2 diabetes mellitus with other specified complication: Secondary | ICD-10-CM

## 2022-06-20 DIAGNOSIS — E1165 Type 2 diabetes mellitus with hyperglycemia: Secondary | ICD-10-CM

## 2022-06-21 DIAGNOSIS — E1169 Type 2 diabetes mellitus with other specified complication: Secondary | ICD-10-CM | POA: Diagnosis not present

## 2022-06-21 DIAGNOSIS — E785 Hyperlipidemia, unspecified: Secondary | ICD-10-CM | POA: Diagnosis not present

## 2022-06-22 LAB — HEPATIC FUNCTION PANEL
ALT: 40 IU/L — ABNORMAL HIGH (ref 0–32)
AST: 24 IU/L (ref 0–40)
Albumin: 4.4 g/dL (ref 3.8–4.8)
Alkaline Phosphatase: 88 IU/L (ref 44–121)
Bilirubin Total: 0.9 mg/dL (ref 0.0–1.2)
Bilirubin, Direct: 0.27 mg/dL (ref 0.00–0.40)
Total Protein: 7.3 g/dL (ref 6.0–8.5)

## 2022-06-25 ENCOUNTER — Ambulatory Visit: Payer: 59 | Admitting: Family Medicine

## 2022-06-25 ENCOUNTER — Encounter: Payer: Self-pay | Admitting: Family Medicine

## 2022-06-25 ENCOUNTER — Other Ambulatory Visit (HOSPITAL_COMMUNITY): Payer: Self-pay

## 2022-06-25 VITALS — BP 104/70 | HR 75 | Temp 98.8°F | Resp 20 | Ht 66.5 in | Wt 234.0 lb

## 2022-06-25 DIAGNOSIS — Z87440 Personal history of urinary (tract) infections: Secondary | ICD-10-CM | POA: Diagnosis not present

## 2022-06-25 DIAGNOSIS — E785 Hyperlipidemia, unspecified: Secondary | ICD-10-CM

## 2022-06-25 DIAGNOSIS — E1169 Type 2 diabetes mellitus with other specified complication: Secondary | ICD-10-CM | POA: Diagnosis not present

## 2022-06-25 DIAGNOSIS — E1165 Type 2 diabetes mellitus with hyperglycemia: Secondary | ICD-10-CM

## 2022-06-25 MED ORDER — CEPHALEXIN 250 MG PO CAPS
250.0000 mg | ORAL_CAPSULE | Freq: Every day | ORAL | 0 refills | Status: DC | PRN
  Filled 2022-06-25: qty 90, 90d supply, fill #0

## 2022-06-26 ENCOUNTER — Other Ambulatory Visit (HOSPITAL_COMMUNITY): Payer: Self-pay

## 2022-06-26 ENCOUNTER — Other Ambulatory Visit: Payer: Self-pay | Admitting: Family Medicine

## 2022-06-26 DIAGNOSIS — E1165 Type 2 diabetes mellitus with hyperglycemia: Secondary | ICD-10-CM

## 2022-06-26 LAB — MICROALBUMIN / CREATININE URINE RATIO
Creatinine, Urine: 85.1 mg/dL
Microalb/Creat Ratio: 6 mg/g creat (ref 0–29)
Microalbumin, Urine: 5.5 ug/mL

## 2022-06-26 LAB — HGB A1C W/O EAG: Hgb A1c MFr Bld: 7.1 % — ABNORMAL HIGH (ref 4.8–5.6)

## 2022-06-26 MED ORDER — SYNJARDY XR 12.5-1000 MG PO TB24
1.0000 | ORAL_TABLET | Freq: Every day | ORAL | 3 refills | Status: DC
  Filled 2022-06-26: qty 80, 80d supply, fill #0
  Filled 2022-06-26: qty 90, 90d supply, fill #0
  Filled 2022-06-26: qty 10, 10d supply, fill #0
  Filled 2022-09-14: qty 90, 90d supply, fill #1
  Filled 2022-12-19: qty 90, 90d supply, fill #2

## 2022-06-27 ENCOUNTER — Other Ambulatory Visit (HOSPITAL_COMMUNITY): Payer: Self-pay

## 2022-08-02 ENCOUNTER — Other Ambulatory Visit (HOSPITAL_COMMUNITY): Payer: Self-pay

## 2022-08-06 ENCOUNTER — Other Ambulatory Visit (HOSPITAL_COMMUNITY): Payer: Self-pay

## 2022-08-06 DIAGNOSIS — Z01419 Encounter for gynecological examination (general) (routine) without abnormal findings: Secondary | ICD-10-CM | POA: Diagnosis not present

## 2022-08-06 DIAGNOSIS — Z803 Family history of malignant neoplasm of breast: Secondary | ICD-10-CM | POA: Diagnosis not present

## 2022-08-06 DIAGNOSIS — N958 Other specified menopausal and perimenopausal disorders: Secondary | ICD-10-CM | POA: Diagnosis not present

## 2022-08-06 DIAGNOSIS — Z800 Family history of malignant neoplasm of digestive organs: Secondary | ICD-10-CM | POA: Diagnosis not present

## 2022-08-06 MED ORDER — ESTRADIOL 0.1 MG/GM VA CREA
TOPICAL_CREAM | VAGINAL | 1 refills | Status: DC
  Filled 2022-08-06: qty 42.5, 30d supply, fill #0
  Filled 2023-04-14: qty 42.5, 90d supply, fill #1

## 2022-08-06 MED ORDER — CLOBETASOL PROPIONATE 0.05 % EX OINT
TOPICAL_OINTMENT | CUTANEOUS | 1 refills | Status: DC
  Filled 2022-08-06: qty 30, 15d supply, fill #0
  Filled 2023-04-14: qty 30, 30d supply, fill #1

## 2022-08-08 ENCOUNTER — Encounter (INDEPENDENT_AMBULATORY_CARE_PROVIDER_SITE_OTHER): Payer: Self-pay

## 2022-08-13 DIAGNOSIS — M170 Bilateral primary osteoarthritis of knee: Secondary | ICD-10-CM | POA: Diagnosis not present

## 2022-08-20 DIAGNOSIS — M170 Bilateral primary osteoarthritis of knee: Secondary | ICD-10-CM | POA: Diagnosis not present

## 2022-08-23 DIAGNOSIS — H2513 Age-related nuclear cataract, bilateral: Secondary | ICD-10-CM | POA: Diagnosis not present

## 2022-08-23 DIAGNOSIS — H0270 Unspecified degenerative disorders of eyelid and periocular area: Secondary | ICD-10-CM | POA: Diagnosis not present

## 2022-08-23 DIAGNOSIS — D23112 Other benign neoplasm of skin of right lower eyelid, including canthus: Secondary | ICD-10-CM | POA: Diagnosis not present

## 2022-08-23 DIAGNOSIS — D23122 Other benign neoplasm of skin of left lower eyelid, including canthus: Secondary | ICD-10-CM | POA: Diagnosis not present

## 2022-08-23 DIAGNOSIS — H02423 Myogenic ptosis of bilateral eyelids: Secondary | ICD-10-CM | POA: Diagnosis not present

## 2022-08-27 ENCOUNTER — Other Ambulatory Visit (HOSPITAL_COMMUNITY): Payer: Self-pay

## 2022-08-27 DIAGNOSIS — M170 Bilateral primary osteoarthritis of knee: Secondary | ICD-10-CM | POA: Diagnosis not present

## 2022-08-27 MED ORDER — IBUPROFEN 800 MG PO TABS
ORAL_TABLET | ORAL | 0 refills | Status: DC
  Filled 2022-08-27: qty 60, 20d supply, fill #0

## 2022-08-28 ENCOUNTER — Other Ambulatory Visit (HOSPITAL_COMMUNITY): Payer: Self-pay

## 2022-09-10 DIAGNOSIS — H524 Presbyopia: Secondary | ICD-10-CM | POA: Diagnosis not present

## 2022-09-10 LAB — HM DIABETES EYE EXAM

## 2022-09-14 ENCOUNTER — Other Ambulatory Visit (HOSPITAL_COMMUNITY): Payer: Self-pay

## 2022-09-15 ENCOUNTER — Other Ambulatory Visit (HOSPITAL_COMMUNITY): Payer: Self-pay

## 2022-09-17 ENCOUNTER — Other Ambulatory Visit (HOSPITAL_COMMUNITY): Payer: Self-pay

## 2022-09-17 MED ORDER — CYCLOSPORINE 0.05 % OP EMUL
OPHTHALMIC | 4 refills | Status: DC
  Filled 2022-09-17: qty 180, 90d supply, fill #0

## 2022-09-18 ENCOUNTER — Other Ambulatory Visit (HOSPITAL_COMMUNITY): Payer: Self-pay

## 2022-09-19 ENCOUNTER — Other Ambulatory Visit (HOSPITAL_COMMUNITY): Payer: Self-pay

## 2022-10-02 ENCOUNTER — Other Ambulatory Visit (HOSPITAL_COMMUNITY): Payer: Self-pay

## 2022-10-03 ENCOUNTER — Other Ambulatory Visit (HOSPITAL_COMMUNITY): Payer: Self-pay

## 2022-10-22 ENCOUNTER — Encounter: Payer: Self-pay | Admitting: Family Medicine

## 2022-10-22 ENCOUNTER — Ambulatory Visit: Payer: 59 | Admitting: Family Medicine

## 2022-10-22 ENCOUNTER — Other Ambulatory Visit (HOSPITAL_COMMUNITY): Payer: Self-pay

## 2022-10-22 VITALS — BP 102/71 | HR 78 | Temp 98.2°F | Ht 66.5 in | Wt 233.2 lb

## 2022-10-22 DIAGNOSIS — G8929 Other chronic pain: Secondary | ICD-10-CM | POA: Diagnosis not present

## 2022-10-22 DIAGNOSIS — M25561 Pain in right knee: Secondary | ICD-10-CM | POA: Diagnosis not present

## 2022-10-22 DIAGNOSIS — E1169 Type 2 diabetes mellitus with other specified complication: Secondary | ICD-10-CM | POA: Diagnosis not present

## 2022-10-22 DIAGNOSIS — E1165 Type 2 diabetes mellitus with hyperglycemia: Secondary | ICD-10-CM | POA: Diagnosis not present

## 2022-10-22 DIAGNOSIS — M25562 Pain in left knee: Secondary | ICD-10-CM

## 2022-10-22 DIAGNOSIS — E785 Hyperlipidemia, unspecified: Secondary | ICD-10-CM

## 2022-10-22 LAB — BAYER DCA HB A1C WAIVED: HB A1C (BAYER DCA - WAIVED): 6.6 % — ABNORMAL HIGH (ref 4.8–5.6)

## 2022-10-22 MED ORDER — PHENTERMINE HCL 37.5 MG PO TABS
18.7500 mg | ORAL_TABLET | Freq: Every day | ORAL | 1 refills | Status: DC
  Filled 2022-10-22: qty 30, 30d supply, fill #0
  Filled 2022-12-31: qty 30, 30d supply, fill #1

## 2022-10-22 NOTE — Progress Notes (Signed)
Subjective: CC:DM PCP: Janora Norlander, DO [id number] Laura Valencia is a 61 y.o. female presenting to clinic today for:  1. Type 2 Diabetes with hypertension, hyperlipidemia:  She is compliant with her Mounjaro.  Reports no nausea, vomiting or abdominal pain.  Unfortunately her weight loss has stalled out at around 229.  Her goal is to get below 200 pounds that she continues to suffer from bilateral knee pain.  She held off on getting any corticosteroid injections since her last appointment in efforts to have better blood sugar control.  She would like to trial back on phentermine as this is something that has helped her with weight loss goals in the past.  Her blood pressure has been controlled and she is required no blood pressure medications.  Last eye exam: UTD Last foot exam: needs Last A1c:  Lab Results  Component Value Date   HGBA1C 7.1 (H) 06/25/2022   Nephropathy screen indicated?: needs Last flu, zoster and/or pneumovax:  Immunization History  Administered Date(s) Administered   Influenza Whole 09/30/2010   Influenza-Unspecified 10/05/2016, 09/30/2021   Moderna Sars-Covid-2 Vaccination 12/29/2019, 01/25/2020   Pneumococcal Polysaccharide-23 09/02/2020   Td 10/09/2016   Unspecified SARS-COV-2 Vaccination 12/29/2019, 01/25/2020   Zoster Recombinat (Shingrix) 08/25/2021, 03/23/2022    ROS: Per HPI  No Known Allergies Past Medical History:  Diagnosis Date   Allergy    seasonal   Arthritis    knees   Colon polyp    TYPE UNKNOWN   Diabetes mellitus, type 2 ([place])    Hyperthyroidism    HX; RX IN THE PAST   OSA (obstructive sleep apnea)    [facility] 06-19-2010 AHI 9.3/HR- wears cpap   Osteoarthritis    Seasonal rhinitis    MILD   Sleep apnea    occasional uses CPAP    Current Outpatient Medications:    aspirin 81 MG tablet, Take 81 mg by mouth daily., Disp: , Rfl:    blood glucose meter kit and supplies, Dispense based on patient and insurance preference.  Use to check blood sugar daily . (FOR ICD-10 E10.9, E11.9)., Disp: 1 each, Rfl: 0   cephALEXin (KEFLEX) 250 MG capsule, Take 1 capsule (250 mg total) by mouth daily as needed (posticotal prophylaxis)., Disp: 90 capsule, Rfl: 0   clobetasol ointment (TEMOVATE) 0.05 %, Apply daily at bedtime to affected area., Disp: 30 g, Rfl: 1   cycloSPORINE (RESTASIS) 0.05 % ophthalmic emulsion, Place 1 drop into both eyes 2 (two) times daily., Disp: 10800 each, Rfl: 4   cycloSPORINE (RESTASIS) 0.05 % ophthalmic emulsion, PLACE 1 DROP INTO BOTH EYES 2 (TWO) TIMES DAILY., Disp: 10800 each, Rfl: 4   Empagliflozin-metFORMIN HCl ER (SYNJARDY XR) 12.04-999 MG TB24, Take 1 tablet by mouth daily., Disp: 90 tablet, Rfl: 3   estradiol (ESTRACE) 0.1 MG/GM vaginal cream, Apply "green pea" sized amount to opening of vagina 3 times a week., Disp: 42.5 g, Rfl: 1   fluticasone (FLONASE) 50 MCG/ACT nasal spray, Place 2 sprays into both nostrils daily., Disp: 16 g, Rfl: 0   glucose blood test strip, Use 1 strip to check blood glucose once daily., Disp: 50 each, Rfl: 4   ibuprofen (ADVIL) 800 MG tablet, Take 1 tablet by mouth three times a day as needed for pain, Disp: 60 tablet, Rfl: 0   rosuvastatin (CRESTOR) 5 MG tablet, Take 1 tablet (5 mg total) by mouth daily., Disp: 90 tablet, Rfl: 3   tirzepatide (MOUNJARO) 15 MG/0.5ML Pen, Inject 15 mg into the  skin once a week., Disp: 6 mL, Rfl: PRN Social History   Socioeconomic History   Marital status: Married    Spouse name: Not on file   Number of children: 2   Years of education: Not on file   Highest education level: Not on file  Occupational History   Occupation: MD    Employer: [employer]    Comment: FAMILY MEDICINE  Tobacco Use   Smoking status: Never   Smokeless tobacco: Never  Vaping Use   Vaping Use: Never used  Substance and Sexual Activity   Alcohol use: Yes    Comment: RARE USE OF ETOH   Drug use: No   Sexual activity: Yes    Birth control/protection:  Post-menopausal  Other Topics Concern   Not on file  Social History Narrative   [place].   Social Determinants of Health   Financial Resource Strain: Not on file  Food Insecurity: Not on file  Transportation Needs: Not on file  Physical Activity: Not on file  Stress: Not on file  Social Connections: Not on file  Intimate Partner Violence: Not on file   Family History  Problem Relation Age of Onset   Colon cancer Mother 43   Esophageal cancer Neg Hx    Stomach cancer Neg Hx    Rectal cancer Neg Hx    Colon polyps Neg Hx     Objective: Office vital signs reviewed. BP 102/71   Pulse 78   Temp 98.2 F (36.8 C)   Ht 5' 6.5" (1.689 m)   Wt 233 lb 3.2 oz (105.8 kg)   LMP 03/19/2012   SpO2 97%   BMI 37.08 kg/m   Physical Examination:  General: Awake, alert, well-appearing obese female. No acute distress HEENT: MMM Cardio: regular rate and rhythm, S1S2 heard, no murmurs appreciated Pulm: clear to auscultation bilaterally, no wheezes, rhonchi or rales; normal work of breathing on room air Extremities: warm, well perfused, No edema, cyanosis or clubbing; +2 pulses bilaterally MSK: Ambulating independently. Neuro: see DM foot  Diabetic Foot Exam - Simple   Simple Foot Form Diabetic Foot exam was performed with the following findings: Yes 10/22/2022  2:45 PM  Visual Inspection See comments: Yes Sensation Testing Intact to touch and monofilament testing bilaterally: Yes Pulse Check Posterior Tibialis and Dorsalis pulse intact bilaterally: Yes Comments Mild hallucis valgus deformity bilaterally     Assessment/ Plan: 61 y.o. female   Controlled type 2 diabetes mellitus with other specified complication, without long-term current use of insulin ([place]) - Plan: CMP14+EGFR, Bayer DCA Hb A1c Waived  Hyperlipidemia associated with type 2 diabetes mellitus (HCC)  Chronic pain of both knees  Morbid obesity ([place]) - Plan: phentermine  (ADIPEX-P) 37.5 MG tablet  Sugar now controlled with A1c down to 6.6.  No changes.  Check metabolic panel for renal function and liver enzymes  Continue statin.  Not due for fasting lipid  Start Adipex 1/2 to 1 tablet daily.  Discussed limited nature of this medication.  Continue to exercise as tolerated and modify diet  Orders Placed This Encounter  Procedures   CMP14+EGFR   Bayer DCA Hb A1c Waived   No orders of the defined types were placed in this encounter.  Janora Norlander, DO [place] [phone]

## 2022-10-23 LAB — CMP14+EGFR
ALT: 40 IU/L — ABNORMAL HIGH (ref 0–32)
AST: 25 IU/L (ref 0–40)
Albumin/Globulin Ratio: 1.8 (ref 1.2–2.2)
Albumin: 4.4 g/dL (ref 3.9–4.9)
Alkaline Phosphatase: 88 IU/L (ref 44–121)
BUN/Creatinine Ratio: 27 (ref 12–28)
BUN: 20 mg/dL (ref 8–27)
Bilirubin Total: 0.4 mg/dL (ref 0.0–1.2)
CO2: 20 mmol/L (ref 20–29)
Calcium: 9.5 mg/dL (ref 8.7–10.3)
Chloride: 110 mmol/L — ABNORMAL HIGH (ref 96–106)
Creatinine, Ser: 0.74 mg/dL (ref 0.57–1.00)
Globulin, Total: 2.4 g/dL (ref 1.5–4.5)
Glucose: 88 mg/dL (ref 70–99)
Potassium: 4 mmol/L (ref 3.5–5.2)
Sodium: 144 mmol/L (ref 134–144)
Total Protein: 6.8 g/dL (ref 6.0–8.5)
eGFR: 91 mL/min/{1.73_m2} (ref >59)

## 2022-11-12 ENCOUNTER — Other Ambulatory Visit (HOSPITAL_COMMUNITY): Payer: Self-pay

## 2022-11-19 ENCOUNTER — Other Ambulatory Visit (HOSPITAL_COMMUNITY): Payer: Self-pay

## 2022-11-21 ENCOUNTER — Other Ambulatory Visit (HOSPITAL_COMMUNITY): Payer: Self-pay

## 2022-11-24 ENCOUNTER — Other Ambulatory Visit (HOSPITAL_COMMUNITY): Payer: Self-pay

## 2022-11-26 DIAGNOSIS — M170 Bilateral primary osteoarthritis of knee: Secondary | ICD-10-CM | POA: Diagnosis not present

## 2022-11-29 ENCOUNTER — Encounter: Payer: Self-pay | Admitting: *Deleted

## 2022-12-19 ENCOUNTER — Other Ambulatory Visit (HOSPITAL_COMMUNITY): Payer: Self-pay

## 2022-12-20 ENCOUNTER — Other Ambulatory Visit: Payer: Self-pay

## 2022-12-20 ENCOUNTER — Other Ambulatory Visit (HOSPITAL_COMMUNITY): Payer: Self-pay

## 2022-12-20 MED ORDER — CYCLOSPORINE 0.05 % OP EMUL
1.0000 [drp] | Freq: Two times a day (BID) | OPHTHALMIC | 4 refills | Status: DC
  Filled 2022-12-20: qty 180, 90d supply, fill #0
  Filled 2023-03-07: qty 180, 90d supply, fill #1

## 2022-12-27 ENCOUNTER — Other Ambulatory Visit: Payer: Self-pay | Admitting: Family Medicine

## 2022-12-27 DIAGNOSIS — Z1231 Encounter for screening mammogram for malignant neoplasm of breast: Secondary | ICD-10-CM

## 2022-12-31 ENCOUNTER — Other Ambulatory Visit (HOSPITAL_COMMUNITY): Payer: Self-pay

## 2023-01-01 ENCOUNTER — Other Ambulatory Visit: Payer: Self-pay

## 2023-01-01 ENCOUNTER — Other Ambulatory Visit (HOSPITAL_COMMUNITY): Payer: Self-pay

## 2023-01-11 ENCOUNTER — Other Ambulatory Visit (HOSPITAL_COMMUNITY): Payer: Self-pay

## 2023-01-14 ENCOUNTER — Other Ambulatory Visit (HOSPITAL_COMMUNITY): Payer: Self-pay

## 2023-01-28 ENCOUNTER — Other Ambulatory Visit (HOSPITAL_COMMUNITY): Payer: Self-pay

## 2023-01-31 ENCOUNTER — Other Ambulatory Visit (HOSPITAL_COMMUNITY): Payer: Self-pay

## 2023-01-31 ENCOUNTER — Other Ambulatory Visit: Payer: Self-pay | Admitting: Family Medicine

## 2023-01-31 DIAGNOSIS — B9789 Other viral agents as the cause of diseases classified elsewhere: Secondary | ICD-10-CM | POA: Diagnosis not present

## 2023-01-31 DIAGNOSIS — J988 Other specified respiratory disorders: Secondary | ICD-10-CM | POA: Diagnosis not present

## 2023-01-31 NOTE — Addendum Note (Signed)
Addended by: Eual Fines on: 01/31/2023 02:06 PM   Modules accepted: Orders

## 2023-02-02 LAB — NOVEL CORONAVIRUS, NAA: SARS-CoV-2, NAA: NOT DETECTED

## 2023-02-19 ENCOUNTER — Other Ambulatory Visit (HOSPITAL_COMMUNITY): Payer: Self-pay

## 2023-02-25 ENCOUNTER — Ambulatory Visit
Admission: RE | Admit: 2023-02-25 | Discharge: 2023-02-25 | Disposition: A | Discharge status: Home / Self Care | Payer: 59 | Source: Ambulatory Visit

## 2023-02-25 DIAGNOSIS — Z1231 Encounter for screening mammogram for malignant neoplasm of breast: Secondary | ICD-10-CM

## 2023-02-25 DIAGNOSIS — M170 Bilateral primary osteoarthritis of knee: Secondary | ICD-10-CM | POA: Diagnosis not present

## 2023-03-05 ENCOUNTER — Encounter: Payer: Self-pay | Admitting: *Deleted

## 2023-03-05 ENCOUNTER — Telehealth: Payer: 59 | Admitting: Nurse Practitioner

## 2023-03-05 DIAGNOSIS — R197 Diarrhea, unspecified: Secondary | ICD-10-CM | POA: Diagnosis not present

## 2023-03-05 DIAGNOSIS — R110 Nausea: Secondary | ICD-10-CM

## 2023-03-05 MED ORDER — ONDANSETRON HCL 4 MG PO TABS: 4.0000 mg | ORAL_TABLET | Freq: Three times a day (TID) | ORAL | 0 refills | Status: DC | PRN

## 2023-03-05 NOTE — Addendum Note (Signed)
Addended by: [name] E on: 03/05/2023 11:19 AM   Modules accepted: Orders, Level of Service

## 2023-03-05 NOTE — Progress Notes (Signed)
E-Visit for Nausea and Vomiting   We can offer medications for the nausea and vomiting, as far as the diarrhea you can consider imodium though that may worsen the nausea if something is trying to move through your GI tract,   As previously noted we highly recommend in person evaluation for testing as you have traveled outside of the [name] recently.   Although nausea and vomiting can make you feel miserable, it's important to remember that these are not diseases, but rather symptoms of an underlying illness.  When we treat short term symptoms, we always caution that any symptoms that persist should be fully evaluated in a medical office.  I have prescribed a medication that will help alleviate your symptoms and allow you to stay hydrated:  Zofran 4 mg 1 tablet every 8 hours as needed for nausea and vomiting  HOME CARE: Drink clear liquids.  This is very important! Dehydration (the lack of fluid) can lead to a serious complication.  Start off with 1 tablespoon every 5 minutes for 8 hours. You may begin eating bland foods after 8 hours without vomiting.  Start with saltine crackers, white bread, rice, mashed potatoes, applesauce. After 48 hours on a bland diet, you may resume a normal diet. Try to go to sleep.  Sleep often empties the stomach and relieves the need to vomit.  GET HELP RIGHT AWAY IF:  Your symptoms do not improve or worsen within 2 days after treatment. You have a fever for over 3 days. You cannot keep down fluids after trying the medication.  MAKE SURE YOU:  Understand these instructions. Will watch your condition. Will get help right away if you are not doing well or get worse.    Thank you for choosing an e-visit.  Your e-visit answers were reviewed by a board certified advanced clinical practitioner to complete your personal care plan. Depending upon the condition, your plan could have included both over the counter or prescription medications.  Please review your  pharmacy choice. Make sure the pharmacy is open so you can pick up prescription now. If there is a problem, you may contact your provider through [employer] and have the prescription routed to another pharmacy.  Your safety is important to [name]. If you have drug allergies check your prescription carefully.   For the next 24 hours you can use MyChart to ask questions about today's visit, request a non-urgent call back, or ask for a work or school excuse. You will get an email in the next two days asking about your experience. I hope that your e-visit has been valuable and will speed your recovery.   Meds ordered this encounter  Medications   ondansetron (ZOFRAN) 4 MG tablet    Sig: Take 1 tablet (4 mg total) by mouth every 8 (eight) hours as needed for nausea or vomiting.    Dispense:  20 tablet    Refill:  0    I spent approximately 7 minutes reviewing the patient's history, current symptoms and coordinating their plan of care today.

## 2023-03-05 NOTE — Progress Notes (Signed)
Laura Valencia,   Based on your recent travel, acute symptoms and the blood in your stool I would recommend you are seen in person so stool samples can be collected.    Though this may be food born, if may also be bacterial or parasitic.    Thank you for understanding. We try our best to provide the best care via telehealth services and want to be sure you receive the proper workup when necessary.    Thank you [name]

## 2023-03-07 ENCOUNTER — Other Ambulatory Visit: Payer: Self-pay | Admitting: Family Medicine

## 2023-03-07 ENCOUNTER — Other Ambulatory Visit (HOSPITAL_COMMUNITY): Payer: Self-pay

## 2023-03-08 ENCOUNTER — Other Ambulatory Visit: Payer: Self-pay

## 2023-03-09 ENCOUNTER — Other Ambulatory Visit (HOSPITAL_COMMUNITY): Payer: Self-pay

## 2023-03-19 ENCOUNTER — Other Ambulatory Visit: Payer: Self-pay | Admitting: *Deleted

## 2023-03-19 ENCOUNTER — Other Ambulatory Visit: Payer: Self-pay | Admitting: Family Medicine

## 2023-03-19 LAB — LAB REPORT - SCANNED
A1c: 6.8
EGFR: 85

## 2023-03-19 MED ORDER — BLOOD GLUCOSE METER KIT: PACK | 0 refills | Status: DC

## 2023-03-28 ENCOUNTER — Other Ambulatory Visit (HOSPITAL_COMMUNITY): Payer: Self-pay

## 2023-04-09 ENCOUNTER — Other Ambulatory Visit (HOSPITAL_COMMUNITY): Payer: Self-pay

## 2023-04-09 ENCOUNTER — Other Ambulatory Visit: Payer: Self-pay | Admitting: Family Medicine

## 2023-04-09 DIAGNOSIS — E1169 Type 2 diabetes mellitus with other specified complication: Secondary | ICD-10-CM

## 2023-04-09 MED ORDER — MOUNJARO 15 MG/0.5ML ~~LOC~~ SOAJ
15.0000 mg | SUBCUTANEOUS | 0 refills | Status: DC
  Filled 2023-04-09: qty 2, 28d supply, fill #0

## 2023-04-10 ENCOUNTER — Other Ambulatory Visit: Payer: Self-pay

## 2023-04-10 ENCOUNTER — Other Ambulatory Visit (HOSPITAL_COMMUNITY): Payer: Self-pay

## 2023-04-11 ENCOUNTER — Other Ambulatory Visit: Payer: Self-pay

## 2023-04-11 ENCOUNTER — Telehealth (INDEPENDENT_AMBULATORY_CARE_PROVIDER_SITE_OTHER): Payer: 59 | Admitting: Family Medicine

## 2023-04-11 ENCOUNTER — Encounter: Payer: Self-pay | Admitting: Family Medicine

## 2023-04-11 ENCOUNTER — Other Ambulatory Visit (HOSPITAL_COMMUNITY): Payer: Self-pay

## 2023-04-11 VITALS — Wt 205.2 lb

## 2023-04-11 DIAGNOSIS — E1169 Type 2 diabetes mellitus with other specified complication: Secondary | ICD-10-CM

## 2023-04-11 DIAGNOSIS — E785 Hyperlipidemia, unspecified: Secondary | ICD-10-CM

## 2023-04-11 DIAGNOSIS — Z7985 Long-term (current) use of injectable non-insulin antidiabetic drugs: Secondary | ICD-10-CM

## 2023-04-11 DIAGNOSIS — R748 Abnormal levels of other serum enzymes: Secondary | ICD-10-CM

## 2023-04-11 MED ORDER — ROSUVASTATIN CALCIUM 5 MG PO TABS
5.0000 mg | ORAL_TABLET | Freq: Every day | ORAL | 3 refills | Status: DC
  Filled 2023-04-11: qty 90, 90d supply, fill #0
  Filled 2023-04-14: qty 90, 90d supply, fill #1

## 2023-04-11 MED ORDER — FREESTYLE LANCETS MISC
0 refills | Status: DC
  Filled 2023-04-11: qty 1, fill #0
  Filled 2023-04-14: qty 100, 90d supply, fill #0

## 2023-04-11 MED ORDER — BLOOD GLUCOSE MONITOR SYSTEM W/DEVICE KIT
PACK | 0 refills | Status: DC
  Filled 2023-04-11: qty 1, 30d supply, fill #0

## 2023-04-11 MED ORDER — MOUNJARO 15 MG/0.5ML ~~LOC~~ SOAJ
15.0000 mg | SUBCUTANEOUS | 3 refills | Status: DC
  Filled 2023-04-11: qty 2, 28d supply, fill #0
  Filled 2023-05-09: qty 2, 28d supply, fill #1
  Filled 2023-06-12 – 2023-07-05 (×2): qty 2, 28d supply, fill #2
  Filled 2023-07-26 – 2023-07-29 (×2): qty 2, 28d supply, fill #3
  Filled 2023-08-19 – 2023-08-21 (×2): qty 2, 28d supply, fill #4
  Filled 2023-09-19: qty 2, 28d supply, fill #5
  Filled 2023-10-16: qty 2, 28d supply, fill #6
  Filled 2023-11-14: qty 2, 28d supply, fill #7

## 2023-04-11 MED ORDER — PHENTERMINE HCL 37.5 MG PO TABS
18.7500 mg | ORAL_TABLET | Freq: Every day | ORAL | 1 refills | Status: DC
  Filled 2023-04-11: qty 30, 30d supply, fill #0
  Filled 2023-04-14 – 2023-06-17 (×2): qty 30, 30d supply, fill #1

## 2023-04-11 MED ORDER — SYNJARDY XR 12.5-1000 MG PO TB24
1.0000 | ORAL_TABLET | Freq: Every day | ORAL | 3 refills | Status: DC
  Filled 2023-04-11: qty 90, 90d supply, fill #0
  Filled 2023-07-09: qty 90, 90d supply, fill #1

## 2023-04-11 MED ORDER — FREESTYLE LANCETS MISC
0 refills | Status: DC
  Filled 2023-04-11 (×2): qty 100, 90d supply, fill #0

## 2023-04-11 MED ORDER — BLOOD GLUCOSE TEST VI STRP
ORAL_STRIP | 3 refills | Status: DC
  Filled 2023-04-11: qty 50, 50d supply, fill #0
  Filled 2023-04-14: qty 100, fill #1
  Filled 2023-07-09: qty 50, 50d supply, fill #1
  Filled 2023-08-19: qty 100, 90d supply, fill #2
  Filled 2023-12-16: qty 100, 90d supply, fill #3

## 2023-04-11 NOTE — Progress Notes (Signed)
MyChart Video visit  Subjective: CC: DM PCP: Raliegh Ip, DO [id number] Laura Valencia is a 62 y.o. female. Patient provides verbal consent for consult held via video.  Due to COVID-19 pandemic this visit was conducted virtually. This visit type was conducted due to national recommendations for restrictions regarding the COVID-19 Pandemic (e.g. social distancing, sheltering in place) in an effort to limit this patient's exposure and mitigate transmission in our community. All issues noted in this document were discussed and addressed.  A physical exam was not performed with this format.   Location of patient: work Location of provider: [facility] Others present for call: none  1. Type 2 Diabetes with hyperlipidemia associated with morbid obesity:  She reports compliance with her Ozempic but admits that sometimes she forgets to take her Synjardy.  It is a very large pill and sometimes she will cut it in half in order to take it.  She is also not super compliant with her Crestor and this is because she was wondering if perhaps that might be causing some liver enzyme issues.  However, she did have doctors today labs and there was really no change in liver enzyme despite having help with the Crestor and or take it less frequently.  She reports that she is now down from the 230s to 205.  This was with the aid of phentermine and she would like to continue spontaneously taking this if possible.  She will continue monitoring her blood pressures extremely closely and she does not report any concerning cardiovascular side effects from the medication.  Last eye exam: Up-to-date Last foot exam: Up-to-date Last A1c:  Lab Results  Component Value Date   HGBA1C 6.6 (H) 10/22/2022   Nephropathy screen indicated?:  Up-to-date Last flu, zoster and/or pneumovax:  Immunization History  Administered Date(s) Administered   Influenza Whole 09/30/2010   Influenza-Unspecified 10/05/2016, 09/30/2021    Moderna Sars-Covid-2 Vaccination 12/29/2019, 01/25/2020   Pneumococcal Polysaccharide-23 09/02/2020   Td 10/09/2016   Unspecified SARS-COV-2 Vaccination 12/29/2019, 01/25/2020   Zoster Recombinat (Shingrix) 08/25/2021, 03/23/2022    ROS: No vaginitis, chest pain, shortness of breath or blurred vision reported.  She reports improvement in knee pain with weight loss.    ROS: Per HPI  No Known Allergies Past Medical History:  Diagnosis Date   Allergy    seasonal   Arthritis    knees   Colon polyp    TYPE UNKNOWN   Diabetes mellitus, type 2    Hyperthyroidism    HX; RX IN THE PAST   OSA (obstructive sleep apnea)    [facility] 06-19-2010 AHI 9.3/HR- wears cpap   Osteoarthritis    Seasonal rhinitis    MILD   Sleep apnea    occasional uses CPAP    Current Outpatient Medications:    Blood Glucose Monitoring Suppl DEVI, FREESTYLE LITE E11.69, Disp: 1 each, Rfl: 0   Glucose Blood (BLOOD GLUCOSE TEST STRIPS) STRP, Use daily as directed, Disp: 100 strip, Rfl: 3   Lancet Device MISC, Check BGs daily E11.69, Disp: 1 each, Rfl: 0   Lancets Misc. MISC, Check Bgs daily E11.69 Freestyle lite, Disp: 100 each, Rfl: 0   aspirin 81 MG tablet, Take 81 mg by mouth daily., Disp: , Rfl:    blood glucose meter kit and supplies, Dispense based on patient and insurance preference. Use to check blood sugar daily . (FOR ICD-10 E10.9, E11.9)., Disp: 1 each, Rfl: 0   cephALEXin (KEFLEX) 250 MG capsule, Take 1 capsule (250 mg  total) by mouth daily as needed (posticotal prophylaxis)., Disp: 90 capsule, Rfl: 0   clobetasol ointment (TEMOVATE) 0.05 %, Apply daily at bedtime to affected area., Disp: 30 g, Rfl: 1   cycloSPORINE (RESTASIS) 0.05 % ophthalmic emulsion, Place 1 drop into both eyes 2 (two) times daily., Disp: 10800 each, Rfl: 4   Empagliflozin-metFORMIN HCl ER (SYNJARDY XR) 12.04-999 MG TB24, Take 1 tablet by mouth daily., Disp: 90 tablet, Rfl: 3   estradiol (ESTRACE) 0.1 MG/GM vaginal cream, Apply "green  pea" sized amount to opening of vagina 3 times a week., Disp: 42.5 g, Rfl: 1   ibuprofen (ADVIL) 800 MG tablet, Take 1 tablet by mouth three times a day as needed for pain, Disp: 60 tablet, Rfl: 0   phentermine (ADIPEX-P) 37.5 MG tablet, Take 1/2 - 1 tablet by mouth daily before breakfast., Disp: 30 tablet, Rfl: 1   rosuvastatin (CRESTOR) 5 MG tablet, Take 1 tablet (5 mg total) by mouth daily., Disp: 90 tablet, Rfl: 3   tirzepatide (MOUNJARO) 15 MG/0.5ML Pen, Inject 15 mg into the skin once a week., Disp: 6 mL, Rfl: 3  Gen: well appearing female, NAD HEENT: sclera white, MMM Psych: mood stable, speech normal.  Assessment/ Plan: 62 y.o. female   Type 2 diabetes mellitus with other specified complication, without long-term current use of insulin - Plan: tirzepatide (MOUNJARO) 15 MG/0.5ML Pen, Empagliflozin-metFORMIN HCl ER (SYNJARDY XR) 12.04-999 MG TB24, Blood Glucose Monitoring Suppl DEVI, Glucose Blood (BLOOD GLUCOSE TEST STRIPS) STRP, Lancet Device MISC, Lancets Misc. MISC, CMP14+EGFR, Bayer DCA Hb A1c Waived  Hyperlipidemia associated with type 2 diabetes mellitus - Plan: rosuvastatin (CRESTOR) 5 MG tablet  Morbid obesity - Plan: phentermine (ADIPEX-P) 37.5 MG tablet  Elevated liver enzymes  Sugar under good control with A1c of 6.8 today.  No changes.  I have scanned in her labs.  Encourage compliance with medications.  Medications have been renewed and sent to her mail order pharmacy.  We will continue sparing use of the phentermine and again we reviewed the cardiovascular impact that this may happen long-term use.  She voiced good understanding and will continue monitoring blood pressures closely at home  Continue Crestor.  Not yet due for fasting lipid.  Future orders for liver enzymes and A1c placed that she can have these done at her workplace. ?FLD contributing to LFT elevation.  The Narcotic Database has been reviewed.  There were no red flags.     Start time: 2:00pm End time:  2:08pm  Total time spent on patient care (including video visit/ documentation): 8 minutes  Laura Valencia Hulen Skains, DO [facility] [phone]

## 2023-04-12 ENCOUNTER — Other Ambulatory Visit: Payer: Self-pay

## 2023-04-12 ENCOUNTER — Other Ambulatory Visit (HOSPITAL_COMMUNITY): Payer: Self-pay

## 2023-04-14 ENCOUNTER — Other Ambulatory Visit: Payer: Self-pay | Admitting: Family Medicine

## 2023-04-14 DIAGNOSIS — E1169 Type 2 diabetes mellitus with other specified complication: Secondary | ICD-10-CM

## 2023-04-15 ENCOUNTER — Other Ambulatory Visit (HOSPITAL_COMMUNITY): Payer: Self-pay

## 2023-04-15 ENCOUNTER — Other Ambulatory Visit: Payer: Self-pay

## 2023-04-15 MED ORDER — BLOOD GLUCOSE MONITOR SYSTEM W/DEVICE KIT
PACK | 0 refills | Status: AC
  Filled 2023-04-15: qty 1, 30d supply, fill #0

## 2023-04-15 MED ORDER — FREESTYLE LANCETS MISC
3 refills | Status: DC
Start: 2023-04-15 — End: 2024-06-08
  Filled 2023-04-15: qty 100, fill #0

## 2023-04-15 MED ORDER — BLOOD GLUCOSE MONITOR SYSTEM W/DEVICE KIT
PACK | 0 refills | Status: DC
Start: 2023-04-15 — End: 2024-02-17
  Filled 2023-04-15: qty 1, fill #0

## 2023-04-16 ENCOUNTER — Other Ambulatory Visit (HOSPITAL_COMMUNITY): Payer: Self-pay

## 2023-04-16 ENCOUNTER — Other Ambulatory Visit: Payer: Self-pay

## 2023-04-17 ENCOUNTER — Other Ambulatory Visit (HOSPITAL_COMMUNITY): Payer: Self-pay

## 2023-04-18 ENCOUNTER — Other Ambulatory Visit (HOSPITAL_COMMUNITY): Payer: Self-pay

## 2023-04-19 ENCOUNTER — Ambulatory Visit: Payer: 59 | Admitting: Family Medicine

## 2023-04-22 ENCOUNTER — Ambulatory Visit: Payer: 59 | Admitting: Family Medicine

## 2023-04-30 ENCOUNTER — Other Ambulatory Visit (HOSPITAL_COMMUNITY): Payer: Self-pay

## 2023-05-09 ENCOUNTER — Other Ambulatory Visit: Payer: Self-pay

## 2023-05-09 ENCOUNTER — Other Ambulatory Visit (HOSPITAL_COMMUNITY): Payer: Self-pay

## 2023-05-10 ENCOUNTER — Other Ambulatory Visit (HOSPITAL_COMMUNITY): Payer: Self-pay

## 2023-05-13 ENCOUNTER — Other Ambulatory Visit (HOSPITAL_COMMUNITY): Payer: Self-pay

## 2023-05-14 ENCOUNTER — Other Ambulatory Visit: Payer: Self-pay

## 2023-05-14 ENCOUNTER — Other Ambulatory Visit (HOSPITAL_COMMUNITY): Payer: Self-pay

## 2023-05-16 ENCOUNTER — Other Ambulatory Visit (HOSPITAL_COMMUNITY): Payer: Self-pay

## 2023-05-20 ENCOUNTER — Other Ambulatory Visit (HOSPITAL_COMMUNITY): Payer: Self-pay

## 2023-05-20 DIAGNOSIS — M170 Bilateral primary osteoarthritis of knee: Secondary | ICD-10-CM | POA: Diagnosis not present

## 2023-06-12 ENCOUNTER — Other Ambulatory Visit (HOSPITAL_COMMUNITY): Payer: Self-pay

## 2023-06-15 ENCOUNTER — Other Ambulatory Visit (HOSPITAL_COMMUNITY): Payer: Self-pay

## 2023-06-15 MED ORDER — CYCLOSPORINE 0.05 % OP EMUL
1.0000 [drp] | Freq: Two times a day (BID) | OPHTHALMIC | 4 refills | Status: DC
  Filled 2023-06-15: qty 180, 90d supply, fill #0
  Filled 2023-10-28: qty 180, 90d supply, fill #1
  Filled 2023-12-16 – 2024-01-22 (×2): qty 180, 90d supply, fill #2
  Filled 2024-02-29 – 2024-04-26 (×2): qty 180, 90d supply, fill #3

## 2023-06-17 ENCOUNTER — Other Ambulatory Visit (HOSPITAL_COMMUNITY): Payer: Self-pay

## 2023-06-17 ENCOUNTER — Other Ambulatory Visit: Payer: Self-pay

## 2023-06-27 ENCOUNTER — Other Ambulatory Visit (HOSPITAL_COMMUNITY): Payer: Self-pay

## 2023-07-05 ENCOUNTER — Other Ambulatory Visit (HOSPITAL_COMMUNITY): Payer: Self-pay

## 2023-07-09 ENCOUNTER — Other Ambulatory Visit (HOSPITAL_COMMUNITY): Payer: Self-pay

## 2023-07-09 ENCOUNTER — Other Ambulatory Visit: Payer: Self-pay | Admitting: *Deleted

## 2023-07-09 DIAGNOSIS — I8393 Asymptomatic varicose veins of bilateral lower extremities: Secondary | ICD-10-CM

## 2023-07-15 ENCOUNTER — Ambulatory Visit (HOSPITAL_COMMUNITY)
Admission: RE | Admit: 2023-07-15 | Discharge: 2023-07-15 | Disposition: A | Discharge status: Home / Self Care | Payer: 59 | Source: Ambulatory Visit | Attending: Surgery | Admitting: Surgery

## 2023-07-15 DIAGNOSIS — I8393 Asymptomatic varicose veins of bilateral lower extremities: Secondary | ICD-10-CM | POA: Insufficient documentation

## 2023-07-26 ENCOUNTER — Other Ambulatory Visit (HOSPITAL_COMMUNITY): Payer: Self-pay

## 2023-07-28 NOTE — Progress Notes (Unsigned)
[facility] OF [place]  ASSESSMENT / PLAN: Laura Valencia is a 62 y.o. female with chronic venous insufficiency of bilateral lower extremities causing swelling (C3 disease).  Venous duplex is significant for right greater saphenous vein reflux which appears amenable to endovenous therapy. The patient prefers non-operative management. She has already seen a benefit from conservative therapy. I counseled her that is very reasonable. Continue compression and elevation for symptomatic relief. Follow up with me as needed.  CHIEF COMPLAINT: swollen legs  HISTORY OF PRESENT ILLNESS: Laura Valencia is a 62 y.o. female who presents to clinic for evaluation bilateral lower extremity swelling.  Patient has significant swelling after returning from a long car trip from.  Both legs are swollen.  She has started compression and elevation therapy and noted significant relief already.  She presents for further evaluation and see if intervention is necessary.  The patient prefers to avoid intervention if at all possible.  She reports no inflammatory problems with the skin.  She has not noted any venous ulceration.  Past Medical History:  Diagnosis Date   Allergy    seasonal   Arthritis    knees   Colon polyp    TYPE UNKNOWN   Diabetes mellitus, type 2 (HCC)    Hyperthyroidism    HX; RX IN THE PAST   OSA (obstructive sleep apnea)    [facility] 06-19-2010 AHI 9.3/HR- wears cpap   Osteoarthritis    Seasonal rhinitis    MILD   Sleep apnea    occasional uses CPAP    Past Surgical History:  Procedure Laterality Date   BREAST EXCISIONAL BIOPSY Left 10+ years ago   intraductal papilloma per pt   CARPAL TUNNEL RELEASE     BILATERAL   COLONOSCOPY  11/2010   stark poylps   POLYPECTOMY     TRIGGER FINGER RELEASE Left    middle finger    Family History  Problem Relation Age of Onset   Colon cancer Mother 74   Esophageal cancer Neg Hx    Stomach cancer Neg Hx     Rectal cancer Neg Hx    Colon polyps Neg Hx     Social History   Socioeconomic History   Marital status: Married    Spouse name: Not on file   Number of children: 2   Years of education: Not on file   Highest education level: Not on file  Occupational History   Occupation: MD    Employer: [employer]    Comment: FAMILY MEDICINE  Tobacco Use   Smoking status: Never   Smokeless tobacco: Never  Vaping Use   Vaping status: Never Used  Substance and Sexual Activity   Alcohol use: Yes    Comment: RARE USE OF ETOH   Drug use: No   Sexual activity: Yes    Birth control/protection: Post-menopausal  Other Topics Concern   Not on file  Social History Narrative   ORIGINALLY FROM [place].   Social Determinants of Health   Financial Resource Strain: Not on file  Food Insecurity: Not on file  Transportation Needs: Not on file  Physical Activity: Not on file  Stress: Not on file  Social Connections: Not on file  Intimate Partner Violence: Not At Risk (08/06/2022)   [facility], [facility]   Humiliation, Afraid, Rape, and Kick questionnaire    Fear of Current or Ex-Partner: No    Emotionally Abused: No    Physically  Abused: No    Sexually Abused: No    No Known Allergies  Current Outpatient Medications  Medication Sig Dispense Refill   aspirin 81 MG tablet Take 81 mg by mouth daily.     Blood Glucose Monitoring Suppl (BLOOD GLUCOSE MONITOR SYSTEM) w/Device KIT Use to check blood sugar daily. DX: E11.9 1 kit 0   Blood Glucose Monitoring Suppl (BLOOD GLUCOSE MONITOR SYSTEM) w/Device KIT Use daily as directed 1 kit 0   cephALEXin (KEFLEX) 250 MG capsule Take 1 capsule (250 mg total) by mouth daily as needed (posticotal prophylaxis). 90 capsule 0   clobetasol ointment (TEMOVATE) 0.05 % Apply daily at bedtime to affected area. 30 g 1   cycloSPORINE (RESTASIS) 0.05 % ophthalmic emulsion Place 1 drop into both eyes 2 (two) times daily. 10800  each 4   cycloSPORINE (RESTASIS) 0.05 % ophthalmic emulsion Place 1 drop into both eyes 2 (two) times daily. 180 each 4   Empagliflozin-metFORMIN HCl ER (SYNJARDY XR) 12.04-999 MG TB24 Take 1 tablet by mouth daily. 90 tablet 3   estradiol (ESTRACE) 0.1 MG/GM vaginal cream Apply "green pea" sized amount to opening of vagina 3 times a week. 42.5 g 1   Glucose Blood (BLOOD GLUCOSE TEST STRIPS) STRP Use daily as directed 100 strip 3   Lancets (FREESTYLE) lancets Use daily as directed 100 each 0   Lancets (FREESTYLE) lancets Use to check blood sugar daily. DX: E11.9 100 each 3   phentermine (ADIPEX-P) 37.5 MG tablet Take 1/2 - 1 tablet by mouth daily before breakfast. 30 tablet 1   rosuvastatin (CRESTOR) 5 MG tablet Take 1 tablet (5 mg total) by mouth daily. 90 tablet 3   tirzepatide (MOUNJARO) 15 MG/0.5ML Pen Inject 15 mg into the skin once a week. 6 mL 3   No current facility-administered medications for this visit.    PHYSICAL EXAM Vitals:   07/29/23 0915  BP: 119/85  Pulse: 70  Resp: 20  Temp: 98.1 F (36.7 C)  SpO2: 98%  Weight: 211 lb (95.7 kg)  Height: 5' 6.5" (1.689 m)    Well-appearing woman in no acute distress Regular rate and rhythm Unlabored breathing 2+ dorsalis pedis pulses bilaterally Scattered reticular veins about the ankles bilaterally 1+ edema about calf and ankle No Stemmer sign  PERTINENT LABORATORY AND RADIOLOGIC DATA  Most recent CBC    Latest Ref Rng & Units 08/14/2021    9:01 AM 01/06/2021   11:53 AM 10/01/2013    5:29 PM  CBC  WBC 3.4 - 10.8 x10E3/uL 4.0  4.7  5.4   Hemoglobin 11.1 - 15.9 g/dL 13.0  86.5  78.4   Hematocrit 34.0 - 46.6 % 40.7  40.3  42.0   Platelets 150 - 450 x10E3/uL 262  286  274      Most recent CMP    Latest Ref Rng & Units 10/22/2022    2:02 PM 06/21/2022    1:26 PM 08/14/2021    9:01 AM  CMP  Glucose 70 - 99 mg/dL 88   696   BUN 8 - 27 mg/dL 20   17   Creatinine 2.95 - 1.00 mg/dL 2.84   1.32   Sodium 440 - 144 mmol/L  144   143   Potassium 3.5 - 5.2 mmol/L 4.0   4.3   Chloride 96 - 106 mmol/L 110   108   CO2 20 - 29 mmol/L 20   22   Calcium 8.7 - 10.3 mg/dL 9.5   9.7  Total Protein 6.0 - 8.5 g/dL 6.8  7.3  7.0   Total Bilirubin 0.0 - 1.2 mg/dL 0.4  0.9  0.7   Alkaline Phos 44 - 121 IU/L 88  88  76   AST 0 - 40 IU/L 25  24  18    ALT 0 - 32 IU/L 40  40  20     Renal function CrCl cannot be calculated (Patient's most recent lab result is older than the maximum 21 days allowed.).  HB A1C (BAYER DCA - WAIVED) (%)  Date Value  10/22/2022 6.6 (H)    LDL Cholesterol (Calc)  Date Value Ref Range Status  03/26/2018 62 mg/dL (calc) Final    Comment:    Reference range: <100 . Desirable range <100 mg/dL for primary prevention;   <70 mg/dL for patients with CHD or diabetic patients  with > or = 2 CHD risk factors. Marland Kitchen LDL-C is now calculated using the Martin-Hopkins  calculation, which is a validated novel method providing  better accuracy than the Friedewald equation in the  estimation of LDL-C.  Horald Pollen et al. Lenox Ahr. 1610;960(45): 2061-2068  (http://education.[employer].com/faq/FAQ164)    LDL Chol Calc (NIH)  Date Value Ref Range Status  08/14/2021 85 0 - 99 mg/dL Final    Right:  - No evidence of deep vein thrombosis seen in the right lower extremity,  from the common femoral through the popliteal veins.  - No evidence of superficial venous thrombosis in the right lower  extremity.  - No evidence of superficial venous reflux seen in the right short  saphenous vein.  - Venous reflux is noted in the right common femoral vein.  - Venous reflux is noted in the right sapheno-femoral junction.  - Venous reflux is noted in the right greater saphenous vein in the thigh.  - Venous reflux is noted in the right greater saphenous vein in the calf.    Left:  - No evidence of deep vein thrombosis seen in the left lower extremity,  from the common femoral through the popliteal veins.  - No  evidence of superficial venous thrombosis in the left lower  extremity.  - No evidence of superficial venous reflux seen in the left short  saphenous vein.  - Venous reflux is noted in the left common femoral vein.   Rande Brunt. Laura Antu, MD FACS [facility] Phone Number: [phone] 07/29/2023 12:02 PM   Total time spent on preparing this encounter including chart review, data review, collecting history, examining the patient, coordinating care for this new patient, 45 minutes.  Portions of this report may have been transcribed using voice recognition software.  Every effort has been made to ensure accuracy; however, inadvertent computerized transcription errors may still be present.

## 2023-07-29 ENCOUNTER — Ambulatory Visit: Payer: 59 | Admitting: Vascular Surgery

## 2023-07-29 ENCOUNTER — Encounter: Payer: Self-pay | Admitting: Vascular Surgery

## 2023-07-29 VITALS — BP 119/85 | HR 70 | Temp 98.1°F | Resp 20 | Ht 66.5 in | Wt 211.0 lb

## 2023-07-29 DIAGNOSIS — I872 Venous insufficiency (chronic) (peripheral): Secondary | ICD-10-CM

## 2023-08-12 DIAGNOSIS — M170 Bilateral primary osteoarthritis of knee: Secondary | ICD-10-CM | POA: Diagnosis not present

## 2023-08-19 ENCOUNTER — Other Ambulatory Visit: Payer: Self-pay

## 2023-08-19 ENCOUNTER — Other Ambulatory Visit (HOSPITAL_COMMUNITY): Payer: Self-pay

## 2023-08-19 MED ORDER — MELOXICAM 15 MG PO TABS
15.0000 mg | ORAL_TABLET | Freq: Every day | ORAL | 2 refills | Status: DC | PRN
  Filled 2023-08-19: qty 30, 30d supply, fill #0
  Filled 2023-10-28: qty 30, 30d supply, fill #1

## 2023-08-20 ENCOUNTER — Other Ambulatory Visit (HOSPITAL_COMMUNITY): Payer: Self-pay

## 2023-08-20 ENCOUNTER — Other Ambulatory Visit: Payer: Self-pay

## 2023-08-20 MED ORDER — FLUTICASONE PROPIONATE 50 MCG/ACT NA SUSP
1.0000 | Freq: Every day | NASAL | 11 refills | Status: DC
  Filled 2023-08-20: qty 16, 60d supply, fill #0
  Filled 2023-10-28: qty 16, 60d supply, fill #1
  Filled 2023-12-26: qty 16, 60d supply, fill #2
  Filled 2024-02-29: qty 16, 60d supply, fill #3
  Filled 2024-04-26: qty 16, 60d supply, fill #4

## 2023-08-20 MED ORDER — CEPHALEXIN 250 MG PO CAPS
250.0000 mg | ORAL_CAPSULE | ORAL | 5 refills | Status: DC | PRN
  Filled 2023-08-20: qty 30, 30d supply, fill #0
  Filled 2023-10-28: qty 30, 30d supply, fill #1
  Filled 2023-12-26: qty 30, 30d supply, fill #2
  Filled 2024-02-29: qty 30, 30d supply, fill #3
  Filled 2024-04-26: qty 30, 30d supply, fill #4

## 2023-08-20 MED ORDER — ESTRADIOL 0.1 MG/GM VA CREA
TOPICAL_CREAM | VAGINAL | 5 refills | Status: DC
  Filled 2023-08-20: qty 42.5, 90d supply, fill #0

## 2023-08-20 MED ORDER — CLOBETASOL PROPIONATE 0.05 % EX OINT
TOPICAL_OINTMENT | CUTANEOUS | 1 refills | Status: DC
  Filled 2023-08-20: qty 60, 30d supply, fill #0
  Filled 2024-02-29: qty 60, 30d supply, fill #1

## 2023-09-06 ENCOUNTER — Telehealth: Payer: Self-pay | Admitting: Family Medicine

## 2023-09-16 DIAGNOSIS — H5203 Hypermetropia, bilateral: Secondary | ICD-10-CM | POA: Diagnosis not present

## 2023-09-19 ENCOUNTER — Other Ambulatory Visit (HOSPITAL_COMMUNITY): Payer: Self-pay

## 2023-10-07 DIAGNOSIS — L858 Other specified epidermal thickening: Secondary | ICD-10-CM | POA: Diagnosis not present

## 2023-10-07 DIAGNOSIS — L603 Nail dystrophy: Secondary | ICD-10-CM | POA: Diagnosis not present

## 2023-10-07 DIAGNOSIS — B351 Tinea unguium: Secondary | ICD-10-CM | POA: Diagnosis not present

## 2023-10-16 ENCOUNTER — Other Ambulatory Visit (HOSPITAL_COMMUNITY): Payer: Self-pay

## 2023-10-18 ENCOUNTER — Encounter: Payer: 59 | Admitting: Family Medicine

## 2023-10-28 ENCOUNTER — Encounter: Payer: 59 | Admitting: Family Medicine

## 2023-10-28 ENCOUNTER — Other Ambulatory Visit: Payer: Self-pay

## 2023-10-28 ENCOUNTER — Telehealth: Payer: Self-pay | Admitting: Family Medicine

## 2023-10-28 NOTE — Telephone Encounter (Signed)
Please let her know I'm out of the country. I'm glad to order her labs and she can get them collected outside of a visit but the soonest Monday we have is 12/2.

## 2023-10-28 NOTE — Telephone Encounter (Signed)
Pt wants to know if she can be worked in to see PCP on a Monday coming up to have her DM rechecked? Says she can only do Mondays because that is the only day that she is not scheduled to see patients at work.

## 2023-11-11 ENCOUNTER — Ambulatory Visit: Payer: 59 | Admitting: Family

## 2023-11-14 ENCOUNTER — Other Ambulatory Visit: Payer: Self-pay

## 2023-11-18 ENCOUNTER — Other Ambulatory Visit (HOSPITAL_COMMUNITY): Payer: Self-pay

## 2023-11-18 MED ORDER — MELOXICAM 15 MG PO TABS
15.0000 mg | ORAL_TABLET | Freq: Every day | ORAL | 2 refills | Status: DC | PRN
  Filled 2023-11-18: qty 30, 30d supply, fill #0
  Filled 2024-04-26: qty 30, 30d supply, fill #1

## 2023-11-19 ENCOUNTER — Other Ambulatory Visit: Payer: Self-pay

## 2023-11-20 ENCOUNTER — Other Ambulatory Visit (HOSPITAL_COMMUNITY): Payer: Self-pay

## 2023-12-04 ENCOUNTER — Encounter: Payer: Self-pay | Admitting: Family Medicine

## 2023-12-04 DIAGNOSIS — E1169 Type 2 diabetes mellitus with other specified complication: Secondary | ICD-10-CM

## 2023-12-04 DIAGNOSIS — E119 Type 2 diabetes mellitus without complications: Secondary | ICD-10-CM

## 2023-12-05 ENCOUNTER — Other Ambulatory Visit (HOSPITAL_COMMUNITY): Payer: Self-pay

## 2023-12-05 ENCOUNTER — Other Ambulatory Visit: Payer: Self-pay

## 2023-12-05 MED ORDER — ROSUVASTATIN CALCIUM 5 MG PO TABS
5.0000 mg | ORAL_TABLET | Freq: Every day | ORAL | 3 refills | Status: DC
  Filled 2023-12-05: qty 90, 90d supply, fill #0

## 2023-12-05 MED ORDER — MOUNJARO 15 MG/0.5ML ~~LOC~~ SOAJ
15.0000 mg | SUBCUTANEOUS | 4 refills | Status: DC
Start: 2023-12-05 — End: 2024-06-08
  Filled 2023-12-05: qty 2, 28d supply, fill #0
  Filled 2023-12-11: qty 6, 84d supply, fill #0
  Filled 2024-02-29: qty 6, 84d supply, fill #1
  Filled 2024-05-27: qty 6, 84d supply, fill #2

## 2023-12-10 ENCOUNTER — Telehealth: Payer: Self-pay | Admitting: Family Medicine

## 2023-12-10 NOTE — Telephone Encounter (Unsigned)
Copied from CRM [id number]. Topic: Clinical - Lab/Test Results >> Dec 10, 2023 11:25 AM Hector Shade B wrote: Reason for CRM: [facility] in [place] Per  [facility] @[facility]  on [facility] [phone] Lab orders are needing to be released for this patient.

## 2023-12-11 ENCOUNTER — Other Ambulatory Visit (HOSPITAL_COMMUNITY): Payer: Self-pay

## 2023-12-11 NOTE — Telephone Encounter (Signed)
Kelci, you need to bring this to the lab. The orders were placed 12/5, sounds like they need to print them or something?

## 2023-12-11 NOTE — Telephone Encounter (Signed)
Done

## 2023-12-12 ENCOUNTER — Telehealth: Payer: Self-pay | Admitting: Family Medicine

## 2023-12-12 ENCOUNTER — Other Ambulatory Visit: Payer: 59

## 2023-12-12 NOTE — Telephone Encounter (Signed)
Spoke with lab and got them to release the orders. Tried to call Felease back at [facility] but had to leave a detailed message.

## 2023-12-12 NOTE — Telephone Encounter (Signed)
Copied from CRM [id number]. Topic: Clinical - Lab/Test Results >> Dec 12, 2023 10:44 AM Laura Valencia wrote: Reason for CRM: Felease [facility] called stating she called Monday stating labs needs to be faxed or released  / callback [phone]

## 2023-12-16 ENCOUNTER — Other Ambulatory Visit (HOSPITAL_COMMUNITY): Payer: Self-pay

## 2023-12-16 DIAGNOSIS — E119 Type 2 diabetes mellitus without complications: Secondary | ICD-10-CM | POA: Diagnosis not present

## 2023-12-16 DIAGNOSIS — Z7985 Long-term (current) use of injectable non-insulin antidiabetic drugs: Secondary | ICD-10-CM | POA: Diagnosis not present

## 2023-12-16 LAB — LIPID PANEL

## 2023-12-17 ENCOUNTER — Other Ambulatory Visit: Payer: Self-pay

## 2023-12-17 ENCOUNTER — Other Ambulatory Visit: Payer: Self-pay | Admitting: Family Medicine

## 2023-12-17 ENCOUNTER — Encounter: Payer: Self-pay | Admitting: Family Medicine

## 2023-12-17 DIAGNOSIS — R7989 Other specified abnormal findings of blood chemistry: Secondary | ICD-10-CM

## 2023-12-17 LAB — CMP14+EGFR
ALT: 41 IU/L — ABNORMAL HIGH (ref 0–32)
AST: 25 IU/L (ref 0–40)
Albumin: 4.2 g/dL (ref 3.9–4.9)
Alkaline Phosphatase: 81 IU/L (ref 44–121)
BUN/Creatinine Ratio: 37 — ABNORMAL HIGH (ref 12–28)
BUN: 24 mg/dL (ref 8–27)
Bilirubin Total: 0.8 mg/dL (ref 0.0–1.2)
CO2: 20 mmol/L (ref 20–29)
Calcium: 9.5 mg/dL (ref 8.7–10.3)
Chloride: 107 mmol/L — ABNORMAL HIGH (ref 96–106)
Creatinine, Ser: 0.65 mg/dL (ref 0.57–1.00)
Globulin, Total: 2.4 g/dL (ref 1.5–4.5)
Glucose: 115 mg/dL — ABNORMAL HIGH (ref 70–99)
Potassium: 4.8 mmol/L (ref 3.5–5.2)
Sodium: 144 mmol/L (ref 134–144)
Total Protein: 6.6 g/dL (ref 6.0–8.5)
eGFR: 98 mL/min/{1.73_m2} (ref >59)

## 2023-12-17 LAB — MICROALBUMIN / CREATININE URINE RATIO
Creatinine, Urine: 112.3 mg/dL
Microalb/Creat Ratio: 8 mg/g{creat} (ref 0–29)
Microalbumin, Urine: 9.4 ug/mL

## 2023-12-17 LAB — LIPID PANEL
Cholesterol, Total: 161 mg/dL (ref 100–199)
HDL: 80 mg/dL (ref >39)
LDL CALC COMMENT:: 2 ratio (ref 0.0–4.4)
LDL Chol Calc (NIH): 72 mg/dL (ref 0–99)
Triglycerides: 38 mg/dL (ref 0–149)
VLDL Cholesterol Cal: 9 mg/dL (ref 5–40)

## 2023-12-17 LAB — HEMOGLOBIN A1C
Est. average glucose Bld gHb Est-mCnc: 157 mg/dL
Hgb A1c MFr Bld: 7.1 % — ABNORMAL HIGH (ref 4.8–5.6)

## 2023-12-17 NOTE — Progress Notes (Signed)
Orders Placed This Encounter  Procedures   US Abdomen Limited RUQ (LIVER/GB)    Standing Status:   Future    Expiration Date:   12/16/2024    Reason for Exam (SYMPTOM  OR DIAGNOSIS REQUIRED):   elevated LFTs    Preferred imaging location?:   [facility]

## 2023-12-20 ENCOUNTER — Ambulatory Visit (HOSPITAL_COMMUNITY): Payer: 59

## 2023-12-27 ENCOUNTER — Encounter: Payer: 59 | Admitting: Family Medicine

## 2023-12-27 ENCOUNTER — Other Ambulatory Visit (HOSPITAL_COMMUNITY): Payer: Self-pay

## 2023-12-30 ENCOUNTER — Ambulatory Visit (HOSPITAL_COMMUNITY)
Admission: RE | Admit: 2023-12-30 | Discharge: 2023-12-30 | Disposition: A | Discharge status: Home / Self Care | Payer: 59 | Source: Ambulatory Visit | Attending: Family Medicine | Admitting: Family Medicine

## 2023-12-30 DIAGNOSIS — R7989 Other specified abnormal findings of blood chemistry: Secondary | ICD-10-CM | POA: Diagnosis not present

## 2024-01-02 ENCOUNTER — Telehealth: Payer: Self-pay | Admitting: Family Medicine

## 2024-01-02 NOTE — Telephone Encounter (Signed)
 Im aware that she has 3 openings on Monday but none are at 2pm which is what the pt is requesting. Can we put her in at 3:00 and tell her to come at 2 or around that time?

## 2024-01-02 NOTE — Telephone Encounter (Signed)
 Okay I will let the patient know. Thank you

## 2024-01-02 NOTE — Telephone Encounter (Signed)
 Is there a way we can work this pt in to be seen on 1/6 around 2pm?  Copied from CRM [id number]. Topic: Appointments - Appointment Cancel/Reschedule >> Jan 02, 2024 12:42 PM Benton O wrote: Patient/patient representative is calling to cancel or reschedule an appointment. Refer to attachments for appointment information. Patient is calling to see if there are any appointments on the 6th of January at 2 pm. No appointments available at 2 pm . Patient say if anything comes about on the 6th at 2pm to please reschedule her appointment for then instead of on the 7th

## 2024-01-06 ENCOUNTER — Ambulatory Visit: Payer: 59 | Admitting: Family Medicine

## 2024-01-07 ENCOUNTER — Ambulatory Visit: Payer: 59 | Admitting: Family Medicine

## 2024-01-23 ENCOUNTER — Other Ambulatory Visit: Payer: Self-pay

## 2024-02-17 ENCOUNTER — Ambulatory Visit (INDEPENDENT_AMBULATORY_CARE_PROVIDER_SITE_OTHER): Payer: Commercial Managed Care - PPO | Admitting: Family Medicine

## 2024-02-17 ENCOUNTER — Other Ambulatory Visit (HOSPITAL_COMMUNITY): Payer: Self-pay

## 2024-02-17 ENCOUNTER — Telehealth: Payer: Self-pay | Admitting: Family Medicine

## 2024-02-17 ENCOUNTER — Encounter: Payer: Self-pay | Admitting: Family Medicine

## 2024-02-17 ENCOUNTER — Other Ambulatory Visit: Payer: Self-pay

## 2024-02-17 VITALS — BP 113/70 | HR 85 | Temp 97.6°F | Ht 66.5 in | Wt 218.0 lb

## 2024-02-17 DIAGNOSIS — Z7985 Long-term (current) use of injectable non-insulin antidiabetic drugs: Secondary | ICD-10-CM

## 2024-02-17 DIAGNOSIS — E559 Vitamin D deficiency, unspecified: Secondary | ICD-10-CM | POA: Diagnosis not present

## 2024-02-17 DIAGNOSIS — E785 Hyperlipidemia, unspecified: Secondary | ICD-10-CM

## 2024-02-17 DIAGNOSIS — E119 Type 2 diabetes mellitus without complications: Secondary | ICD-10-CM

## 2024-02-17 DIAGNOSIS — K760 Fatty (change of) liver, not elsewhere classified: Secondary | ICD-10-CM | POA: Diagnosis not present

## 2024-02-17 DIAGNOSIS — E1169 Type 2 diabetes mellitus with other specified complication: Secondary | ICD-10-CM | POA: Diagnosis not present

## 2024-02-17 MED ORDER — SYNJARDY XR 12.5-1000 MG PO TB24
1.0000 | ORAL_TABLET | Freq: Every day | ORAL | 3 refills | Status: DC
  Filled 2024-02-17: qty 90, 90d supply, fill #0

## 2024-02-17 NOTE — Progress Notes (Signed)
Subjective: CC:DM PCP: Raliegh Ip, DO VWU:[id number] Laura Valencia is a 63 y.o. female presenting to clinic today for:  1. Type 2 Diabetes with hyperlipidemia:  Reports compliance with her Katy Apo.  She takes the statin a couple days per week.  She is trying to modify lifestyle in efforts to reduce weight and improve cardiovascular health.  She started exercising again recently.  Diabetes Health Maintenance Due  Topic Date Due   OPHTHALMOLOGY EXAM  09/11/2023   HEMOGLOBIN A1C  06/15/2024   FOOT EXAM  10/23/2024    Last A1c:  Lab Results  Component Value Date   HGBA1C 7.1 (H) 12/16/2023    ROS: Denies dizziness, LOC, polyuria, polydipsia, unintended weight loss/gain, foot ulcerations, numbness or tingling in extremities, shortness of breath or chest pain.   ROS: Per HPI  No Known Allergies Past Medical History:  Diagnosis Date   Allergy    seasonal   Arthritis    knees   Colon polyp    TYPE UNKNOWN   Diabetes mellitus, type 2 (HCC)    Hyperthyroidism    HX; RX IN THE PAST   OSA (obstructive sleep apnea)    [facility] 06-19-2010 AHI 9.3/HR- wears cpap   Osteoarthritis    Seasonal rhinitis    MILD   Sleep apnea    occasional uses CPAP    Current Outpatient Medications:    aspirin 81 MG tablet, Take 81 mg by mouth daily., Disp: , Rfl:    Blood Glucose Monitoring Suppl (BLOOD GLUCOSE MONITOR SYSTEM) w/Device KIT, Use daily as directed, Disp: 1 kit, Rfl: 0   cephALEXin (KEFLEX) 250 MG capsule, Take 1 capsule (250 mg total) by mouth as needed after intercourse to prevent UTI, Disp: 30 capsule, Rfl: 5   clobetasol ointment (TEMOVATE) 0.05 %, Apply daily at bedtime to affected area., Disp: 60 g, Rfl: 1   cycloSPORINE (RESTASIS) 0.05 % ophthalmic emulsion, Place 1 drop into both eyes 2 (two) times daily., Disp: 180 each, Rfl: 4   estradiol (ESTRACE) 0.1 MG/GM vaginal cream, Apply "green pea" sized amount to opening of vagina 3 times a week., Disp: 42.5 g,  Rfl: 5   fluticasone (FLONASE) 50 MCG/ACT nasal spray, Place 1 spray into both nostrils daily., Disp: 16 g, Rfl: 11   Glucose Blood (BLOOD GLUCOSE TEST STRIPS) STRP, Use daily as directed, Disp: 100 strip, Rfl: 3   Lancets (FREESTYLE) lancets, Use to check blood sugar daily. DX: E11.9, Disp: 100 each, Rfl: 3   meloxicam (MOBIC) 15 MG tablet, Take 1 tablet (15 mg total) by mouth daily as needed for pain, Disp: 30 tablet, Rfl: 2   rosuvastatin (CRESTOR) 5 MG tablet, Take 1 tablet (5 mg total) by mouth daily., Disp: 90 tablet, Rfl: 3   tirzepatide (MOUNJARO) 15 MG/0.5ML Pen, Inject 15 mg into the skin once a week., Disp: 6 mL, Rfl: 4   Empagliflozin-metFORMIN HCl ER (SYNJARDY XR) 12.04-999 MG TB24, Take 1 tablet by mouth daily., Disp: 90 tablet, Rfl: 3 Social History   Socioeconomic History   Marital status: Married    Spouse name: Not on file   Number of children: 2   Years of education: Not on file   Highest education level: Not on file  Occupational History   Occupation: MD    Employer: [employer]    Comment: FAMILY MEDICINE  Tobacco Use   Smoking status: Never   Smokeless tobacco: Never  Vaping Use   Vaping status: Never Used  Substance and  Sexual Activity   Alcohol use: Yes    Comment: RARE USE OF ETOH   Drug use: No   Sexual activity: Yes    Birth control/protection: Post-menopausal  Other Topics Concern   Not on file  Social History Narrative   ORIGINALLY FROM [place].   Social Drivers of Corporate investment banker Strain: Not on file  Food Insecurity: Not on file  Transportation Needs: Not on file  Physical Activity: Not on file  Stress: Not on file  Social Connections: Not on file  Intimate Partner Violence: Not At Risk (08/19/2023)   [facility] from [facility]   Humiliation, Afraid, Rape, and Kick questionnaire    Fear of Current or Ex-Partner: No    Emotionally Abused: No    Physically Abused: No    Sexually Abused: No   Family History   Problem Relation Age of Onset   Colon cancer Mother 11   Esophageal cancer Neg Hx    Stomach cancer Neg Hx    Rectal cancer Neg Hx    Colon polyps Neg Hx     Objective: Office vital signs reviewed. BP 113/70   Pulse 85   Temp 97.6 F (36.4 C)   Ht 5' 6.5" (1.689 m)   Wt 218 lb (98.9 kg)   LMP 03/19/2012   SpO2 95%   BMI 34.66 kg/m   Physical Examination:  General: Awake, alert, obese, No acute distress HEENT: [place];era white, MMM Cardio: regular rate and rhythm, S1S2 heard, no murmurs appreciated Pulm: clear to auscultation bilaterally, no wheezes, rhonchi or rales; normal work of breathing on room air GI: soft, non-tender, non-distended, bowel sounds present x4, no hepatomegaly, no splenomegaly, no masses Extremities: warm, well perfused, No edema, cyanosis or clubbing; +2 pulses bilaterally    Diabetic Foot Exam - Simple   Simple Foot Form Diabetic Foot exam was performed with the following findings: Yes 02/17/2024  3:44 PM  Visual Inspection No deformities, no ulcerations, no other skin breakdown bilaterally: Yes Sensation Testing Intact to touch and monofilament testing bilaterally: Yes Pulse Check Posterior Tibialis and Dorsalis pulse intact bilaterally: Yes Comments    Recent Results (from the past 2160 hours)  Hemoglobin A1c     Status: Abnormal   Collection Time: 12/16/23  9:46 AM  Result Value Ref Range   Hgb A1c MFr Bld 7.1 (H) 4.8 - 5.6 %    Comment:          Prediabetes: 5.7 - 6.4          Diabetes: >6.4          Glycemic control for adults with diabetes: <7.0    Est. average glucose Bld gHb Est-mCnc 157 mg/dL  Lipid panel     Status: None   Collection Time: 12/16/23  9:46 AM  Result Value Ref Range   Cholesterol, Total 161 100 - 199 mg/dL   Triglycerides 38 0 - 149 mg/dL   HDL 80 >16 mg/dL   VLDL Cholesterol Cal 9 5 - 40 mg/dL   LDL Chol Calc (NIH) 72 0 - 99 mg/dL   Chol/HDL Ratio 2.0 0.0 - 4.4 ratio    Comment:                                    T. Chol/HDL Ratio  Men  Women                               1/2 Avg.Risk  3.4    3.3                                   Avg.Risk  5.0    4.4                                2X Avg.Risk  9.6    7.1                                3X Avg.Risk 23.4   11.0   Microalbumin / creatinine urine ratio     Status: None   Collection Time: 12/16/23  9:46 AM  Result Value Ref Range   Creatinine, Urine 112.3 Not Estab. mg/dL   Microalbumin, Urine 9.4 Not Estab. ug/mL   Microalb/Creat Ratio 8 0 - 29 mg/g creat    Comment:                        Normal:                0 -  29                        Moderately increased: 30 - 300                        Severely increased:       >300   CMP14+EGFR     Status: Abnormal   Collection Time: 12/16/23  9:46 AM  Result Value Ref Range   Glucose 115 (H) 70 - 99 mg/dL   BUN 24 8 - 27 mg/dL   Creatinine, Ser 4.09 0.57 - 1.00 mg/dL   eGFR 98 >81 XB/JYN/8.29   BUN/Creatinine Ratio 37 (H) 12 - 28   Sodium 144 134 - 144 mmol/L   Potassium 4.8 3.5 - 5.2 mmol/L   Chloride 107 (H) 96 - 106 mmol/L   CO2 20 20 - 29 mmol/L   Calcium 9.5 8.7 - 10.3 mg/dL   Total Protein 6.6 6.0 - 8.5 g/dL   Albumin 4.2 3.9 - 4.9 g/dL   Globulin, Total 2.4 1.5 - 4.5 g/dL   Bilirubin Total 0.8 0.0 - 1.2 mg/dL   Alkaline Phosphatase 81 44 - 121 IU/L   AST 25 0 - 40 IU/L   ALT 41 (H) 0 - 32 IU/L     Assessment/ Plan: 63 y.o. female   Diabetes mellitus treated with injections of non-insulin medication (HCC) - Plan: Empagliflozin-metFORMIN HCl ER (SYNJARDY XR) 12.04-999 MG TB24, Hemoglobin A1c, CBC  Hyperlipidemia associated with type 2 diabetes mellitus (HCC) - Plan: Lipid panel  Fatty liver - Plan: Lipid panel, CMP14+EGFR  Vitamin D deficiency - Plan: VITAMIN D 25 Hydroxy (Vit-D Deficiency, Fractures)  Continue Mounjaro, Synjardy as prescribed.  Future orders for hemoglobin A1c and fasting labs placed.  Foot exam performed today.  Plan  for pneumococcal vaccination at next visit.  She will set up diabetic eye exam.  Okay to pulse dose statin.  Ongoing lifestyle modification to reduce progression  of fatty liver disease.  Will plan to recheck LFTs and vitamin D given history of deficiency  Encouraged to schedule full physical exam with fasting labs for 6 months checkup  Akima Slaugh Hulen Skains, DO [facility] [phone]

## 2024-02-19 NOTE — Telephone Encounter (Signed)
Messaged pt on mychart.

## 2024-03-02 ENCOUNTER — Encounter (HOSPITAL_BASED_OUTPATIENT_CLINIC_OR_DEPARTMENT_OTHER): Payer: Self-pay | Admitting: Radiology

## 2024-03-02 ENCOUNTER — Other Ambulatory Visit: Payer: Self-pay

## 2024-03-02 ENCOUNTER — Ambulatory Visit: Payer: 59 | Admitting: Family Medicine

## 2024-03-02 ENCOUNTER — Ambulatory Visit (HOSPITAL_BASED_OUTPATIENT_CLINIC_OR_DEPARTMENT_OTHER)
Admission: RE | Admit: 2024-03-02 | Discharge: 2024-03-02 | Disposition: A | Discharge status: Home / Self Care | Payer: 59 | Source: Ambulatory Visit

## 2024-03-02 DIAGNOSIS — Z1231 Encounter for screening mammogram for malignant neoplasm of breast: Secondary | ICD-10-CM | POA: Insufficient documentation

## 2024-04-26 ENCOUNTER — Other Ambulatory Visit: Payer: Self-pay | Admitting: Family Medicine

## 2024-04-26 DIAGNOSIS — E1169 Type 2 diabetes mellitus with other specified complication: Secondary | ICD-10-CM

## 2024-04-27 ENCOUNTER — Other Ambulatory Visit: Payer: Self-pay

## 2024-04-27 MED ORDER — BLOOD GLUCOSE TEST VI STRP
ORAL_STRIP | 3 refills | Status: AC
  Filled 2024-04-27: qty 100, 90d supply, fill #0

## 2024-04-28 ENCOUNTER — Other Ambulatory Visit: Payer: Self-pay

## 2024-04-29 ENCOUNTER — Other Ambulatory Visit: Payer: Self-pay | Admitting: Internal Medicine

## 2024-04-29 DIAGNOSIS — U071 COVID-19: Secondary | ICD-10-CM

## 2024-04-29 MED ORDER — NIRMATRELVIR/RITONAVIR (PAXLOVID)TABLET: 3.0000 | ORAL_TABLET | Freq: Two times a day (BID) | ORAL | 0 refills | Status: AC

## 2024-05-27 ENCOUNTER — Other Ambulatory Visit (HOSPITAL_COMMUNITY): Payer: Self-pay

## 2024-06-01 ENCOUNTER — Other Ambulatory Visit (HOSPITAL_COMMUNITY): Payer: Self-pay

## 2024-06-08 ENCOUNTER — Other Ambulatory Visit: Payer: Self-pay

## 2024-06-08 ENCOUNTER — Ambulatory Visit: Payer: Self-pay | Admitting: Family Medicine

## 2024-06-08 ENCOUNTER — Encounter: Payer: Self-pay | Admitting: Family Medicine

## 2024-06-08 ENCOUNTER — Ambulatory Visit (INDEPENDENT_AMBULATORY_CARE_PROVIDER_SITE_OTHER)

## 2024-06-08 ENCOUNTER — Other Ambulatory Visit (HOSPITAL_COMMUNITY): Payer: Self-pay

## 2024-06-08 ENCOUNTER — Ambulatory Visit (INDEPENDENT_AMBULATORY_CARE_PROVIDER_SITE_OTHER): Admitting: Family Medicine

## 2024-06-08 VITALS — BP 103/60 | HR 72 | Temp 98.7°F | Ht 66.0 in | Wt 219.0 lb

## 2024-06-08 DIAGNOSIS — Z780 Asymptomatic menopausal state: Secondary | ICD-10-CM

## 2024-06-08 DIAGNOSIS — Z0001 Encounter for general adult medical examination with abnormal findings: Secondary | ICD-10-CM | POA: Diagnosis not present

## 2024-06-08 DIAGNOSIS — E559 Vitamin D deficiency, unspecified: Secondary | ICD-10-CM | POA: Diagnosis not present

## 2024-06-08 DIAGNOSIS — E785 Hyperlipidemia, unspecified: Secondary | ICD-10-CM | POA: Diagnosis not present

## 2024-06-08 DIAGNOSIS — K760 Fatty (change of) liver, not elsewhere classified: Secondary | ICD-10-CM | POA: Diagnosis not present

## 2024-06-08 DIAGNOSIS — D497 Neoplasm of unspecified behavior of endocrine glands and other parts of nervous system: Secondary | ICD-10-CM

## 2024-06-08 DIAGNOSIS — H02401 Unspecified ptosis of right eyelid: Secondary | ICD-10-CM | POA: Diagnosis not present

## 2024-06-08 DIAGNOSIS — Z23 Encounter for immunization: Secondary | ICD-10-CM | POA: Diagnosis not present

## 2024-06-08 DIAGNOSIS — E119 Type 2 diabetes mellitus without complications: Secondary | ICD-10-CM | POA: Diagnosis not present

## 2024-06-08 DIAGNOSIS — Z7985 Long-term (current) use of injectable non-insulin antidiabetic drugs: Secondary | ICD-10-CM

## 2024-06-08 DIAGNOSIS — N8110 Cystocele, unspecified: Secondary | ICD-10-CM

## 2024-06-08 DIAGNOSIS — E1169 Type 2 diabetes mellitus with other specified complication: Secondary | ICD-10-CM

## 2024-06-08 DIAGNOSIS — Z0000 Encounter for general adult medical examination without abnormal findings: Secondary | ICD-10-CM

## 2024-06-08 LAB — BAYER DCA HB A1C WAIVED: HB A1C (BAYER DCA - WAIVED): 6.7 % — ABNORMAL HIGH (ref 4.8–5.6)

## 2024-06-08 MED ORDER — FREESTYLE LANCETS MISC
3 refills | Status: AC
  Filled 2024-06-08: qty 100, 90d supply, fill #0

## 2024-06-08 MED ORDER — MELOXICAM 15 MG PO TABS
15.0000 mg | ORAL_TABLET | Freq: Every day | ORAL | 2 refills | Status: DC | PRN
  Filled 2024-06-08: qty 30, 30d supply, fill #0
  Filled 2024-08-22: qty 30, 30d supply, fill #1
  Filled 2024-10-16: qty 30, 30d supply, fill #2

## 2024-06-08 MED ORDER — ESTRADIOL 0.1 MG/GM VA CREA
TOPICAL_CREAM | VAGINAL | 5 refills | Status: AC
Start: 2024-06-08 — End: ?
  Filled 2024-06-08: qty 42.5, 90d supply, fill #0

## 2024-06-08 MED ORDER — CEPHALEXIN 250 MG PO CAPS
250.0000 mg | ORAL_CAPSULE | ORAL | 5 refills | Status: AC | PRN
  Filled 2024-06-08: qty 30, 30d supply, fill #0
  Filled 2024-10-26: qty 30, 30d supply, fill #1
  Filled 2024-12-22: qty 30, 30d supply, fill #2

## 2024-06-08 MED ORDER — ROSUVASTATIN CALCIUM 5 MG PO TABS
5.0000 mg | ORAL_TABLET | Freq: Every day | ORAL | 3 refills | Status: AC
  Filled 2024-06-08: qty 90, 90d supply, fill #0
  Filled 2024-10-16: qty 90, 90d supply, fill #1
  Filled 2025-01-14: qty 90, 90d supply, fill #2

## 2024-06-08 MED ORDER — CLOBETASOL PROPIONATE 0.05 % EX OINT
TOPICAL_OINTMENT | CUTANEOUS | 1 refills | Status: AC
  Filled 2024-06-08: qty 60, 30d supply, fill #0

## 2024-06-08 MED ORDER — MOUNJARO 15 MG/0.5ML ~~LOC~~ SOAJ
15.0000 mg | SUBCUTANEOUS | 4 refills | Status: AC
  Filled 2024-06-08 – 2024-08-22 (×2): qty 6, 84d supply, fill #0
  Filled 2024-11-12: qty 6, 84d supply, fill #1

## 2024-06-08 MED ORDER — FLUTICASONE PROPIONATE 50 MCG/ACT NA SUSP
1.0000 | Freq: Every day | NASAL | 11 refills | Status: AC
  Filled 2024-06-08 – 2024-08-22 (×2): qty 16, 60d supply, fill #0
  Filled 2024-11-12: qty 16, 60d supply, fill #1
  Filled 2025-01-14: qty 16, 60d supply, fill #2

## 2024-06-08 MED ORDER — SYNJARDY XR 12.5-1000 MG PO TB24
1.0000 | ORAL_TABLET | Freq: Every day | ORAL | 3 refills | Status: AC
  Filled 2024-06-08: qty 90, 90d supply, fill #0
  Filled 2024-10-16: qty 90, 90d supply, fill #1
  Filled 2025-01-14: qty 90, 90d supply, fill #2

## 2024-06-08 NOTE — Progress Notes (Signed)
 Laura Valencia is a 63 y.o. female presents to office today for annual physical exam examination.    Concerns today include: 1. Type 2 Diabetes with hypertension, hyperlipidemia/eyelid ptosis:  Patient reports compliance with her medications.  No hypoglycemia.  She is not exercising regularly but she is watching her diet.  Last eye exam: UTD Last foot exam: UTD Last A1c:  Lab Results  Component Value Date   HGBA1C 7.1 (H) 12/16/2023   Nephropathy screen indicated?: UTD Last flu, zoster and/or pneumovax:  Immunization History  Administered Date(s) Administered   Influenza Whole 09/30/2010   Influenza-Unspecified 10/05/2016, 09/30/2021, 10/16/2023   Moderna Sars-Covid-2 Vaccination 12/29/2019, 01/25/2020   Pfizer Covid-19 Vaccine Bivalent Booster 52yrs & up 10/15/2023   Pneumococcal Polysaccharide-23 09/02/2020   Td 10/09/2016   Unspecified SARS-COV-2 Vaccination 12/29/2019, 01/25/2020   Zoster Recombinant(Shingrix) 08/25/2021, 03/23/2022    ROS: No chest pain or shortness of breath or falls but she has had some right upper lid ptosis for about 5 weeks.  It occurred when she was on vacation in the [place].  She denies any other changes including blurred vision, difficulty with speech or swallowing.  No sensory changes.  She denies any freezing injury or recent illness  2.  Bladder prolapse Patient with known bladder prolapse.  She does admit to some urinary leakage.  She would like to go ahead and see urogynecology because she feels like she is past the stage of needing pessary and would like to just proceed with surgical interventions if possible.   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[employer], Marital status: married, Substance use: none Health Maintenance Due  Topic Date Due   Pneumonia Vaccine 21+ Years old (2 of 2 - PCV) 09/02/2021   COVID-19 Vaccine (6 - 2024-25 season) 12/10/2023   DEXA SCAN  Never done   Refills needed today: all  Immunization History   Administered Date(s) Administered   Influenza Whole 09/30/2010   Influenza-Unspecified 10/05/2016, 09/30/2021, 10/16/2023   Moderna Sars-Covid-2 Vaccination 12/29/2019, 01/25/2020   Pfizer Covid-19 Vaccine Bivalent Booster 62yrs & up 10/15/2023   Pneumococcal Polysaccharide-23 09/02/2020   Td 10/09/2016   Unspecified SARS-COV-2 Vaccination 12/29/2019, 01/25/2020   Zoster Recombinant(Shingrix) 08/25/2021, 03/23/2022   Past Medical History:  Diagnosis Date   Allergy    seasonal   Arthritis    knees   Colon polyp    TYPE UNKNOWN   Diabetes mellitus, type 2 (HCC)    Hyperthyroidism    HX; RX IN THE PAST   OSA (obstructive sleep apnea)    [facility] 06-19-2010 AHI 9.3/HR- wears cpap   Osteoarthritis    Seasonal rhinitis    MILD   Sleep apnea    occasional uses CPAP   Social History   Socioeconomic History   Marital status: Married    Spouse name: Not on file   Number of children: 2   Years of education: Not on file   Highest education level: Not on file  Occupational History   Occupation: MD    Employer: [employer]    Comment: FAMILY MEDICINE  Tobacco Use   Smoking status: Never   Smokeless tobacco: Never  Vaping Use   Vaping status: Never Used  Substance and Sexual Activity   Alcohol use: Yes    Comment: RARE USE OF ETOH   Drug use: No   Sexual activity: Yes    Birth control/protection: Post-menopausal  Other Topics Concern   Not on file  Social History Narrative   [place]   Social Drivers of Corporate investment banker Strain: Not on file  Food Insecurity: Not on file  Transportation Needs: Not on file  Physical Activity: Not on file  Stress: Not on file  Social Connections: Not on file  Intimate Partner Violence: Not At Risk (08/19/2023)   [facility]   Humiliation, Afraid, Rape, and Kick questionnaire    Fear of Current or Ex-Partner: No    Emotionally Abused: No    Physically Abused: No    Sexually  Abused: No   Past Surgical History:  Procedure Laterality Date   BREAST EXCISIONAL BIOPSY Left 10+ years ago   intraductal papilloma per pt   CARPAL TUNNEL RELEASE     BILATERAL   COLONOSCOPY  11/2010   [facility]   POLYPECTOMY     TRIGGER FINGER RELEASE Left    middle finger   Family History  Problem Relation Age of Onset   Colon cancer Mother 13   Esophageal cancer Neg Hx    Stomach cancer Neg Hx    Rectal cancer Neg Hx    Colon polyps Neg Hx     Current Outpatient Medications:    aspirin 81 MG tablet, Take 81 mg by mouth daily., Disp: , Rfl:    Blood Glucose Monitoring Suppl (BLOOD GLUCOSE MONITOR SYSTEM) w/Device KIT, Use daily as directed, Disp: 1 kit, Rfl: 0   cephALEXin  (KEFLEX ) 250 MG capsule, Take 1 capsule (250 mg total) by mouth as needed after intercourse to prevent UTI, Disp: 30 capsule, Rfl: 5   clobetasol  ointment (TEMOVATE ) 0.05 %, Apply daily at bedtime to affected area., Disp: 60 g, Rfl: 1   cycloSPORINE  (RESTASIS ) 0.05 % ophthalmic emulsion, Place 1 drop into both eyes 2 (two) times daily., Disp: 180 each, Rfl: 4   Empagliflozin -metFORMIN  HCl ER (SYNJARDY  XR) 12.04-999 MG TB24, Take 1 tablet by mouth daily., Disp: 90 tablet, Rfl: 3   estradiol  (ESTRACE ) 0.1 MG/GM vaginal cream, Apply "green pea" sized amount to opening of vagina 3 times a week., Disp: 42.5 g, Rfl: 5   fluticasone  (FLONASE ) 50 MCG/ACT nasal spray, Place 1 spray into both nostrils daily., Disp: 16 g, Rfl: 11   Glucose Blood (BLOOD GLUCOSE TEST STRIPS) STRP, check blood sugar daily. DX: E11.9, Disp: 100 strip, Rfl: 3   Lancets (FREESTYLE) lancets, Use to check blood sugar daily. DX: E11.9, Disp: 100 each, Rfl: 3   meloxicam  (MOBIC ) 15 MG tablet, Take 1 tablet (15 mg total) by mouth daily as needed for pain, Disp: 30 tablet, Rfl: 2   rosuvastatin  (CRESTOR ) 5 MG tablet, Take 1 tablet (5 mg total) by mouth daily., Disp: 90 tablet, Rfl: 3   tirzepatide  (MOUNJARO ) 15 MG/0.5ML Pen, Inject 15 mg into  the skin once a week., Disp: 6 mL, Rfl: 4  No Known Allergies   ROS: Review of Systems Pertinent items noted in HPI and remainder of comprehensive ROS otherwise negative.    Physical exam BP 103/60   Pulse 72   Temp 98.7 F (37.1 C)   Ht 5\' 6"  (1.676 m)   Wt 219 lb (99.3 kg)   LMP 03/19/2012   SpO2 97%   BMI 35.35 kg/m  General appearance: alert, cooperative, appears stated age, no distress, and mildly obese Head: Normocephalic, without obvious abnormality, atraumatic Eyes: negative findings: conjunctivae and sclerae normal, corneas clear, pupils equal, round, reactive to light and accomodation, and right upper lid ptosis noted that obscures about 50% of the upper  sclera/cornea Ears: normal TM's and external ear canals both ears Nose: Nares normal. Septum midline. Mucosa normal. No drainage or sinus tenderness. Throat: lips, mucosa, and tongue normal; teeth and gums normal Neck: no adenopathy, supple, symmetrical, trachea midline, and thyroid  not enlarged, symmetric, no tenderness/mass/nodules Back: symmetric, no curvature. ROM normal. No CVA tenderness. Lungs: clear to auscultation bilaterally Heart: regular rate and rhythm, S1, S2 normal, no murmur, click, rub or gallop Abdomen: soft, non-tender; bowel sounds normal; no masses,  no organomegaly Extremities: Nonpitting edema of the ankles bilaterally with associated varicose veins Pulses: 2+ and symmetric Skin: Varicose veins as above Lymph nodes: Cervical, supraclavicular, and axillary nodes normal. Neurologic: Cranial nerves II through XII grossly intact.     06/08/2024    1:44 PM 02/17/2024    3:20 PM 10/22/2022    2:28 PM  Depression screen PHQ 2/9  Decreased Interest 0 0 0  Down, Depressed, Hopeless 0 0 0  PHQ - 2 Score 0 0 0  Altered sleeping 0 0   Tired, decreased energy 0 0   Change in appetite 0 0   Feeling bad or failure about yourself  0 0   Trouble concentrating 0 0   Moving slowly or fidgety/restless 0 0    Suicidal thoughts 0 0   PHQ-9 Score 0 0   Difficult doing work/chores Not difficult at all Not difficult at all       06/08/2024    1:44 PM 02/17/2024    3:20 PM 10/22/2022    2:28 PM 08/25/2021    3:29 PM  GAD 7 : Generalized Anxiety Score  Nervous, Anxious, on Edge 0 0 0 0  Control/stop worrying 0 0 0 0  Worry too much - different things 0 0 0 0  Trouble relaxing 0 0 0 0  Restless 0 0 0 0  Easily annoyed or irritable 0 0 0 0  Afraid - awful might happen 0 0 0 0  Total GAD 7 Score 0 0 0 0  Anxiety Difficulty Not difficult at all Not difficult at all Not difficult at all Not difficult at all     Assessment/ Plan: Cyndie Dredge here for annual physical exam.   Annual physical exam  Encounter for Prevnar pneumococcal vaccination - Plan: Pneumococcal conjugate vaccine 20-valent (Prevnar 20)  Ptosis of eyelid, right - Plan: MR Brain Wo Contrast, CBC, TSH + free T4  Bladder prolapse, female, acquired - Plan: Ambulatory referral to Urogynecology, CBC, clobetasol  ointment (TEMOVATE ) 0.05 %, estradiol  (ESTRACE ) 0.1 MG/GM vaginal cream  Asymptomatic postmenopausal estrogen deficiency - Plan: DG [facility] DEXA  Diabetes mellitus treated with injections of non-insulin  medication (HCC) - Plan: Bayer DCA Hb A1c Waived, CT CARDIAC SCORING (SELF PAY ONLY), CMP14+EGFR, Empagliflozin -metFORMIN  HCl ER (SYNJARDY  XR) 12.04-999 MG TB24, Lancets (FREESTYLE) lancets, tirzepatide  (MOUNJARO ) 15 MG/0.5ML Pen  Hyperlipidemia associated with type 2 diabetes mellitus (HCC) - Plan: CT CARDIAC SCORING (SELF PAY ONLY), CMP14+EGFR, TSH + free T4, rosuvastatin  (CRESTOR ) 5 MG tablet  Fatty liver - Plan: CT CARDIAC SCORING (SELF PAY ONLY), CBC, CMP14+EGFR, Lipid Panel  Vitamin D  deficiency - Plan: VITAMIN D  25 Hydroxy (Vit-D Deficiency, Fractures)  Pneumococcal vaccination administered.    ASAP MRI of brain ordered.  Uncertain to why she is having right lid ptosis.  Her symptoms do not seem  consistent with a typical Bell's palsy.  Differentials included myasthenia gravis, CVA/3rd nerve palsy, atypical Bell's palsy.  Will rule out intracranial insult first and then proceed with referral to either  ophthalmology or neurology pending result  Referral to urogynecology placed for bladder prolapse.  She is interested in surgical interventions.  I have renewed her vaginal creams for as needed use  Check fasting labs.  All meds renewed.  She will continue treatment for diabetes as directed to prevent progression of fatty liver disease.  Coronary artery calcium  scoring ordered as well today  Check vitamin D  given history of deficiency.  She will get DEXA scan updated today as well  Counseled on healthy lifestyle choices, including diet (rich in fruits, vegetables and lean meats and low in salt and simple carbohydrates) and exercise (at least 30 minutes of moderate physical activity daily).  Patient to follow up 77m for DM  Lawana Hartzell M. Bonnell Butcher, DO

## 2024-06-08 NOTE — Patient Instructions (Signed)
 Preventive Care 43 Years and Older, Female Preventive care refers to lifestyle choices and visits with your health care provider that can promote health and wellness. Preventive care visits are also called wellness exams. What can I expect for my preventive care visit? Counseling Your health care provider may ask you questions about your: Medical history, including: Past medical problems. Family medical history. Pregnancy and menstrual history. History of falls. Current health, including: Memory and ability to understand (cognition). Emotional well-being. Home life and relationship well-being. Sexual activity and sexual health. Lifestyle, including: Alcohol, nicotine or tobacco, and drug use. Access to firearms. Diet, exercise, and sleep habits. Work and work Astronomer. Sunscreen use. Safety issues such as seatbelt and bike helmet use. Physical exam Your health care provider will check your: Height and weight. These may be used to calculate your BMI (body mass index). BMI is a measurement that tells if you are at a healthy weight. Waist circumference. This measures the distance around your waistline. This measurement also tells if you are at a healthy weight and may help predict your risk of certain diseases, such as type 2 diabetes and high blood pressure. Heart rate and blood pressure. Body temperature. Skin for abnormal spots. What immunizations do I need?  Vaccines are usually given at various ages, according to a schedule. Your health care provider will recommend vaccines for you based on your age, medical history, and lifestyle or other factors, such as travel or where you work. What tests do I need? Screening Your health care provider may recommend screening tests for certain conditions. This may include: Lipid and cholesterol levels. Hepatitis C test. Hepatitis B test. HIV (human immunodeficiency virus) test. STI (sexually transmitted infection) testing, if you are at  risk. Lung cancer screening. Colorectal cancer screening. Diabetes screening. This is done by checking your blood sugar (glucose) after you have not eaten for a while (fasting). Mammogram. Talk with your health care provider about how often you should have regular mammograms. BRCA-related cancer screening. This may be done if you have a family history of breast, ovarian, tubal, or peritoneal cancers. Bone density scan. This is done to screen for osteoporosis. Talk with your health care provider about your test results, treatment options, and if necessary, the need for more tests. Follow these instructions at home: Eating and drinking  Eat a diet that includes fresh fruits and vegetables, whole grains, lean protein, and low-fat dairy products. Limit your intake of foods with high amounts of sugar, saturated fats, and salt. Take vitamin and mineral supplements as recommended by your health care provider. Do not drink alcohol if your health care provider tells you not to drink. If you drink alcohol: Limit how much you have to 0-1 drink a day. Know how much alcohol is in your drink. In the [place] one drink equals one 12 oz bottle of beer (355 mL), one 5 oz glass of wine (148 mL), or one 1 oz glass of hard liquor (44 mL). Lifestyle Brush your teeth every morning and night with fluoride toothpaste. Floss one time each day. Exercise for at least 30 minutes 5 or more days each week. Do not use any products that contain nicotine or tobacco. These products include cigarettes, chewing tobacco, and vaping devices, such as e-cigarettes. If you need help quitting, ask your health care provider. Do not use drugs. If you are sexually active, practice safe sex. Use a condom or other form of protection in order to prevent STIs. Take aspirin only as told by  your health care provider. Make sure that you understand how much to take and what form to take. Work with your health care provider to find out whether it  is safe and beneficial for you to take aspirin daily. Ask your health care provider if you need to take a cholesterol-lowering medicine (statin). Find healthy ways to manage stress, such as: Meditation, yoga, or listening to music. Journaling. Talking to a trusted person. Spending time with friends and family. Minimize exposure to UV radiation to reduce your risk of skin cancer. Safety Always wear your seat belt while driving or riding in a vehicle. Do not drive: If you have been drinking alcohol. Do not ride with someone who has been drinking. When you are tired or distracted. While texting. If you have been using any mind-altering substances or drugs. Wear a helmet and other protective equipment during sports activities. If you have firearms in your house, make sure you follow all gun safety procedures. What's next? Visit your health care provider once a year for an annual wellness visit. Ask your health care provider how often you should have your eyes and teeth checked. Stay up to date on all vaccines. This information is not intended to replace advice given to you by your health care provider. Make sure you discuss any questions you have with your health care provider. Document Revised: 06/14/2021 Document Reviewed: 06/14/2021 Elsevier Patient Education  2024 [employer].

## 2024-06-09 ENCOUNTER — Other Ambulatory Visit (HOSPITAL_COMMUNITY): Payer: Self-pay

## 2024-06-09 ENCOUNTER — Other Ambulatory Visit: Payer: Self-pay

## 2024-06-09 DIAGNOSIS — Z780 Asymptomatic menopausal state: Secondary | ICD-10-CM | POA: Diagnosis not present

## 2024-06-09 LAB — CMP14+EGFR
ALT: 44 IU/L — ABNORMAL HIGH (ref 0–32)
AST: 33 IU/L (ref 0–40)
Albumin: 4.4 g/dL (ref 3.9–4.9)
Alkaline Phosphatase: 97 IU/L (ref 44–121)
BUN/Creatinine Ratio: 25 (ref 12–28)
BUN: 18 mg/dL (ref 8–27)
Bilirubin Total: 0.7 mg/dL (ref 0.0–1.2)
CO2: 21 mmol/L (ref 20–29)
Calcium: 9.6 mg/dL (ref 8.7–10.3)
Chloride: 106 mmol/L (ref 96–106)
Creatinine, Ser: 0.72 mg/dL (ref 0.57–1.00)
Globulin, Total: 2.3 g/dL (ref 1.5–4.5)
Glucose: 80 mg/dL (ref 70–99)
Potassium: 4.7 mmol/L (ref 3.5–5.2)
Sodium: 142 mmol/L (ref 134–144)
Total Protein: 6.7 g/dL (ref 6.0–8.5)
eGFR: 93 mL/min/{1.73_m2} (ref >59)

## 2024-06-09 LAB — LIPID PANEL
Chol/HDL Ratio: 2 ratio (ref 0.0–4.4)
Cholesterol, Total: 148 mg/dL (ref 100–199)
HDL: 74 mg/dL (ref >39)
LDL Chol Calc (NIH): 63 mg/dL (ref 0–99)
Triglycerides: 50 mg/dL (ref 0–149)
VLDL Cholesterol Cal: 11 mg/dL (ref 5–40)

## 2024-06-09 LAB — CBC
Hematocrit: 42.3 % (ref 34.0–46.6)
Hemoglobin: 13.2 g/dL (ref 11.1–15.9)
MCH: 27.3 pg (ref 26.6–33.0)
MCHC: 31.2 g/dL — ABNORMAL LOW (ref 31.5–35.7)
MCV: 88 fL (ref 79–97)
Platelets: 259 10*3/uL (ref 150–450)
RBC: 4.83 x10E6/uL (ref 3.77–5.28)
RDW: 13.3 % (ref 11.7–15.4)
WBC: 4.9 10*3/uL (ref 3.4–10.8)

## 2024-06-09 LAB — VITAMIN D 25 HYDROXY (VIT D DEFICIENCY, FRACTURES): Vit D, 25-Hydroxy: 18.9 ng/mL — ABNORMAL LOW (ref 30.0–100.0)

## 2024-06-09 LAB — TSH+FREE T4
Free T4: 1.42 ng/dL (ref 0.82–1.77)
TSH: 0.904 u[IU]/mL (ref 0.450–4.500)

## 2024-06-09 MED ORDER — VITAMIN D (ERGOCALCIFEROL) 1.25 MG (50000 UNIT) PO CAPS
50000.0000 [IU] | ORAL_CAPSULE | ORAL | 4 refills | Status: AC
  Filled 2024-06-09: qty 12, 84d supply, fill #0
  Filled 2024-09-08: qty 12, 84d supply, fill #1
  Filled 2024-11-27: qty 12, 84d supply, fill #2

## 2024-06-15 ENCOUNTER — Ambulatory Visit (HOSPITAL_COMMUNITY)
Admission: RE | Admit: 2024-06-15 | Discharge: 2024-06-15 | Disposition: A | Discharge status: Home / Self Care | Source: Ambulatory Visit | Attending: Family Medicine | Admitting: Family Medicine

## 2024-06-15 ENCOUNTER — Other Ambulatory Visit: Payer: Self-pay | Admitting: Family Medicine

## 2024-06-15 DIAGNOSIS — E236 Other disorders of pituitary gland: Secondary | ICD-10-CM

## 2024-06-15 DIAGNOSIS — R220 Localized swelling, mass and lump, head: Secondary | ICD-10-CM | POA: Diagnosis not present

## 2024-06-15 DIAGNOSIS — H02401 Unspecified ptosis of right eyelid: Secondary | ICD-10-CM | POA: Diagnosis not present

## 2024-06-17 ENCOUNTER — Encounter: Payer: Commercial Managed Care - PPO | Admitting: Family Medicine

## 2024-06-17 ENCOUNTER — Ambulatory Visit (HOSPITAL_COMMUNITY)
Admission: RE | Admit: 2024-06-17 | Discharge: 2024-06-17 | Disposition: A | Discharge status: Home / Self Care | Source: Ambulatory Visit | Attending: Family Medicine | Admitting: Family Medicine

## 2024-06-17 DIAGNOSIS — D140 Benign neoplasm of middle ear, nasal cavity and accessory sinuses: Secondary | ICD-10-CM | POA: Diagnosis not present

## 2024-06-17 DIAGNOSIS — E236 Other disorders of pituitary gland: Secondary | ICD-10-CM | POA: Insufficient documentation

## 2024-06-17 MED ORDER — GADOBUTROL 1 MMOL/ML IV SOLN
10.0000 mL | Freq: Once | INTRAVENOUS | Status: AC | PRN
  Administered 2024-06-17: 10 mL via INTRAVENOUS

## 2024-06-18 ENCOUNTER — Ambulatory Visit: Admitting: Internal Medicine

## 2024-06-18 ENCOUNTER — Encounter: Payer: Self-pay | Admitting: Internal Medicine

## 2024-06-18 VITALS — BP 122/70 | HR 70 | Ht 66.0 in | Wt 220.0 lb

## 2024-06-18 DIAGNOSIS — D352 Benign neoplasm of pituitary gland: Secondary | ICD-10-CM | POA: Diagnosis not present

## 2024-06-18 NOTE — Progress Notes (Signed)
 Patient ID: Laura Valencia, female   DOB: [birth year], 63 y.o.   MRN: [id number]  HPI  Laura Valencia is a 63 y.o.-year-old female, referred by her PCP, Dr.Gottschalk, for evaluation for a newly diagnosed pituitary macroadenoma.  She is here with her husband.  Pt. has been dx with a pituitary adenoma several days ago, when an MRI of the brain was obtained to investigate blepharoptosis.  She mentions that she noticed that her right upper eyelid was drooping approximately 5 weeks ago, when she was in vacation.  She does not feel that this has progressed.  No double vision or other visual impairment.  She does mention a dull headache and some fatigue.    Brain MRI without contrast (06/15/2024): Brain: Positive for lobulated combined intra sellar and suprasellar soft tissue mass, encompassing 13 by 23 by 18 mm. Asymmetric right cavernous sinus involvement suspected on coronal image 20. A suprasellar component effaces the optic chiasm on series 9, image 14. The lesion is mostly intermediate density T1, T2/FLAIR signal. No normal pituitary parenchyma evident on this noncontrast exam.   No other evidence of intracranial mass or mass effect. No superimposed restricted diffusion to suggest acute infarction. No midline shift, mass effect, evidence of mass lesion, ventriculomegaly, extra-axial collection or acute intracranial hemorrhage. Cervicomedullary junction is within normal limits.   Mild for age scattered cerebral white matter T2 and FLAIR hyperintensity in a nonspecific configuration. No cortical encephalomalacia or chronic cerebral blood products identified. Deep gray nuclei, brainstem, cerebellum appear negative.   Vascular: Major intracranial vascular flow voids are preserved.   Skull and upper cervical spine: Normal background bone marrow signal. Negative for age visible cervical spine. No central skull base or clivus invasion identified.   Sinuses/Orbits:  Negative; paranasal sinuses well aerated.   Other: Mastoids are clear. Grossly normal visible internal auditory structures. Negative visible scalp and face.   IMPRESSION: 1. Positive for an estimated 2.3 cm intrasellar mass with suprasellar extension. Favor Pituitary Macroadenoma and there is probable cavernous sinus involvement on the right. Follow-up dedicated Pituitary Mass Protocol Brain MRI (thin slice imaging) without and with contrast recommended to best characterize, along with referral for Neurosurgical and/or Endocrine management.   2. No other acute intracranial abnormality. Mild for age cerebral white matter signal changes.   Pituitary MRI with and without contrast (06/17/2024):  1. Thin slice Pituitary imaging most compatible with an intrasellar Macroadenoma, 13 x 18 x 21 mm, eccentric to the right and with suprasellar and right cavernous sinus extension.  Compressed junction of the optic chiasm and posterior right optic nerve. And the more homogeneously enhancing pituitary parenchyma is compressed into the left sella.   2. No other acute intracranial abnormality. Stable MRI appearance of the brain otherwise.      Pt c/o: - headaches - weight gain - fatigue - constipation - dry skin - hair loss - depression  I reviewed pt's thyroid  tests and they were normal: Lab Results  Component Value Date   TSH 0.904 06/08/2024   TSH 0.817 06/22/2019   TSH 0.85 07/09/2017   TSH 1.02 03/21/2017   FREET4 1.42 06/08/2024   FREET4 1.21 06/22/2019   FREET4 1.2 07/09/2017    Pt. also has a history of type 2 diabetes, hyperlipidemia, OSA, history of hyperthyroidism - Graves ds - RAI tx 1992.   She does not have a family history of pituitary diseases. Her daughter died at age 74 of cholangiocarcinoma.  She has another daughter living, age 54. Mother died  at age 75 of colon cancer.  ROS: Constitutional: no weight gain/loss, + fatigue, no subjective  hyperthermia/hypothermia Eyes: no blurry vision, no xerophthalmia ENT: no sore throat, no dysphagia/odynophagia, no hoarseness Cardiovascular: no CP/SOB/palpitations/leg swelling Respiratory: no cough/SOB Gastrointestinal: no N/V/D/C Musculoskeletal: no muscle/+ joint aches Skin: no rashes Neurological: no tremors/numbness/tingling/dizziness, + dull HA Psychiatric: no depression/anxiety  Past Medical History:  Diagnosis Date   Allergy    seasonal   Arthritis    knees   Colon polyp    TYPE UNKNOWN   Diabetes mellitus, type 2 (HCC)    Hyperthyroidism    HX; RX IN THE PAST   OSA (obstructive sleep apnea)    [facility] 06-19-2010 AHI 9.3/HR- wears cpap   Osteoarthritis    Seasonal rhinitis    MILD   Sleep apnea    occasional uses CPAP   Past Surgical History:  Procedure Laterality Date   BREAST EXCISIONAL BIOPSY Left 10+ years ago   intraductal papilloma per pt   CARPAL TUNNEL RELEASE     BILATERAL   COLONOSCOPY  11/2010   [facility]   POLYPECTOMY     TRIGGER FINGER RELEASE Left    middle finger   Social History   Socioeconomic History   Marital status: Married    Spouse name: Not on file   Number of children: 2   Years of education: Not on file   Highest education level: Not on file  Occupational History   Occupation: MD    Employer: [employer]    Comment: FAMILY MEDICINE  Tobacco Use   Smoking status: Never   Smokeless tobacco: Never  Vaping Use   Vaping status: Never Used  Substance and Sexual Activity   Alcohol use: Yes    Comment: RARE USE OF ETOH   Drug use: No   Sexual activity: Yes    Birth control/protection: Post-menopausal  Other Topics Concern   Not on file  Social History Narrative   ORIGINALLY FROM THE [place].   Social Drivers of Corporate investment banker Strain: Not on file  Food Insecurity: Not on file  Transportation Needs: Not on file  Physical Activity: Not on file  Stress: Not on file  Social Connections: Not on  file  Intimate Partner Violence: Not At Risk (08/19/2023)   Received [facility]   Humiliation, Afraid, Rape, and Kick questionnaire    Within the last year, have you been afraid of your partner or ex-partner?: No    Within the last year, have you been humiliated or emotionally abused in other ways by your partner or ex-partner?: No    Within the last year, have you been kicked, hit, slapped, or otherwise physically hurt by your partner or ex-partner?: No    Within the last year, have you been raped or forced to have any kind of sexual activity by your partner or ex-partner?: No   Current Outpatient Medications on File Prior to Visit  Medication Sig Dispense Refill   aspirin 81 MG tablet Take 81 mg by mouth daily.     Blood Glucose Monitoring Suppl (BLOOD GLUCOSE MONITOR SYSTEM) w/Device KIT Use daily as directed 1 kit 0   cephALEXin  (KEFLEX ) 250 MG capsule Take 1 capsule (250 mg total) by mouth as needed after intercourse to prevent UTI 30 capsule 5   clobetasol  ointment (TEMOVATE ) 0.05 % Apply daily at bedtime to affected area. 60 g 1   cycloSPORINE  (RESTASIS ) 0.05 % ophthalmic emulsion Place 1 drop  into both eyes 2 (two) times daily. 180 each 4   Empagliflozin -metFORMIN  HCl ER (SYNJARDY  XR) 12.04-999 MG TB24 Take 1 tablet by mouth daily. 90 tablet 3   estradiol  (ESTRACE ) 0.1 MG/GM vaginal cream Apply green pea sized amount to opening of vagina 3 times a week. 42.5 g 5   fluticasone  (FLONASE ) 50 MCG/ACT nasal spray Place 1 spray into both nostrils daily. 16 g 11   Glucose Blood (BLOOD GLUCOSE TEST STRIPS) STRP check blood sugar daily. DX: E11.9 100 strip 3   Lancets (FREESTYLE) lancets Use to check blood sugar daily. DX: E11.9 100 each 3   meloxicam  (MOBIC ) 15 MG tablet Take 1 tablet (15 mg total) by mouth daily as needed for pain 30 tablet 2   rosuvastatin  (CRESTOR ) 5 MG tablet Take 1 tablet (5 mg total) by mouth daily. 90 tablet 3   tirzepatide  (MOUNJARO ) 15 MG/0.5ML Pen Inject  15 mg into the skin once a week. 6 mL 4   Vitamin D , Ergocalciferol , (DRISDOL ) 1.25 MG (50000 UNIT) CAPS capsule Take 1 capsule (50,000 Units total) by mouth every 7 (seven) days. 12 capsule 4   No current facility-administered medications on file prior to visit.   No Known Allergies Family History  Problem Relation Age of Onset   Colon cancer Mother 62   Esophageal cancer Neg Hx    Stomach cancer Neg Hx    Rectal cancer Neg Hx    Colon polyps Neg Hx    PE: BP 122/70   Pulse 70   Ht 5' 6 (1.676 m)   Wt 220 lb (99.8 kg)   LMP 03/19/2012   SpO2 99%   BMI 35.51 kg/m  Wt Readings from Last 3 Encounters:  06/18/24 220 lb (99.8 kg)  06/08/24 219 lb (99.3 kg)  02/17/24 218 lb (98.9 kg)   Constitutional: overweight, in NAD Eyes:  EOMI, no exophthalmos ENT: no neck masses, no cervical lymphadenopathy Cardiovascular: RRR, No MRG Respiratory: CTA B Musculoskeletal: no deformities Skin:no rashes Neurological: no tremor with outstretched hands, VF intact by confrontation except decreased perception in R inferior and lateral fields  ASSESSMENT: 1. Pituitary macroadenoma  PLAN:  1. Patient with a newly diagnosed pituitary macroadenoma, found on imaging obtained for R blepharoptosis. - I reviewed the pituitary MRI images and report along with the patient and her husband. I explained that, since the tumor is under 1 cm, this qualifies as a micro-, rather than a macro-adenoma.  We discussed that these tumors are extremely rarely malignant, the vast majority of them are benign.  - We discussed about the fact that a pituitary adenoma can be: Not producing hormones, not compressing the pituitary gland or the optic chiasm Not producing hormones, but compressing either the pituitary gland (causing hypopituitarism) or the optic chiasm (causing visual field cuts), also, intracranial compression symptoms - in her case, she has cranial nerve III compression Producing hormones: - Prolactin  (prolactinoma) -we discussed that larger sizes of prolactinomas encountered in postmenopausal women due to the lack of disruption of menstrual cycles after menopause - ACTH (Cushing's disease) - she has mild weight gain but she does not have central distribution of fat, wide, purple, stretch marks, full supraclavicular fat pads.  She does have diabetes, though, well-controlled. - growth hormone (acromegaly) - she denies an enlarged jaw, change in the size of rings or changing shoe sizes - LH or FSH (gonadotropin secreting tumor) - usually asymptomatic in postmenopausal women except for compression symptoms - TSH (TSH secreting tumor) (very  rare) -this would be quite rare, and recent thyroid  function tests were normal - At today's visit, we will check a cortisol level, ACTH, IGF-I, prolactin, FSH, LH - If prolactin is elevated, this could be either from tumor hypersecretion or from pituitary stalk compression, depending on the level.  If this is a true prolactinoma, a trial of Cabergoline could be attempted to see if her blepharoptosis improves.  However, if this is not a prolactinoma or if the blepharoptosis does not improve, she will need surgery sooner rather than later. - At today's visit, we reviewed the images of her pituitary tumor (she initially had a noncontrast MRI, but she had an MRI with and without contrast yesterday), and we discussed that we will also need to obtain a visual field to see if this is impacted.  This will also serve as a baseline for further follow-up.  She has this scheduled in few days. - we discussed about referring her to neurosurgery.  She was already referred to neurosurgery in [place]-Dr. Jannis Merchant and also has appointment with Dr. Elliott Guy (neuro endocrinology).  The appointment is scheduled for the beginning of next month. - I explained that there are 3 types of pituitary surgeries: Transsphenoidal nasal (most commonly used) Transsphenoidal sublabial - under  upper lip Open craniotomy (rarely needed) -We also discussed about follow-up: I plan to see her back in 2 months if undergoing surgery, but in 6 months if we are following her expectantly.  In 6 months from now we will need to obtain another MRI and we will need to repeat her pituitary biochemical investigation.  Emilie Harden, MD PhD [facility]

## 2024-06-18 NOTE — Patient Instructions (Addendum)
 Please stop at the lab.  Please return in 2 months.  Surgery for Pituitary Tumors  Surgery is the main treatment for many pituitary tumors. How the surgery is done (and how well it works) depends on several factors, including the type of tumor, its size and location, and if it has spread into nearby structures.  Transsphenoidal surgery This is the most common way to remove pituitary tumors. Transsphenoidal surgery is done through the sphenoid sinus, a hollow space in the skull behind the nasal passages and below the brain. The back wall of the sinus is just below the pituitary gland.  More and more, this surgery is done by a team of surgeons that includes a neurosurgeon and an otolaryngologist (ENT surgeon).  To reach the pituitary, the surgeon first makes a small cut inside the nose, and then opens the bony walls of the sphenoid sinus with small surgical instruments. Other small tools are then passed through the opening to remove the tumor.  The surgeon can look at the tumor and nearby structures with an endoscope, a thin fiber-optic tube with a tiny video camera at the tip.  No part of the brain is touched during transsphenoidal surgery, so the chance of damaging the brain is very low. There are fewer side effects with this approach than with craniotomy (see below), and there's also no visible scar. But it's sometimes harder to take out large tumors this way.  When this surgery is done by an experienced neurosurgeon and the tumor is small (a microadenoma), the chances that it can be removed completely are high. If the tumor is large or has grown into the nearby structures (such as nerves, brain tissue, or the tissues covering the brain) the chances of removing the tumor completely are lower, and the chance of damaging nearby brain tissue, nerves, and blood vessels is higher.  Craniotomy If the pituitary tumor is larger or more complicated, a craniotomy may be needed. This surgery is done  through an opening in the front of the skull, off to one side. The surgeon has to work carefully beneath and between the lobes of the brain to reach the tumor.  A craniotomy has a higher chance of brain injury and other side effects than transsphenoidal surgery for small tumors, but it's safer for large and complex tumors because the surgeon is better able to see and reach the tumor as well as nearby nerves and blood vessels.  Planning surgery For both transsphenoidal surgery and craniotomy, the surgeon may use image guidance with MRI or CT scans before surgery to learn as much as they can about the tumor and nearby structures. It's important to know how big the tumor is, exactly where it is in the pituitary, whether it has spread beyond the pituitary gland, and where important nearby structures are. This helps plan the best way to do the surgery and gives an idea of how likely it is that the tumor can be removed completely.  Rarely, for very large tumors that have spread to nearby tissues, both types of surgery are used at the same time to try to remove all of the tumor.  In general, smaller pituitary tumors are easier to treat with surgery. The larger and more invasive the tumor, the less likely it is that the tumor can be removed completely. Side effects also tend to be more likely after surgery to remove large, invasive tumors.  Possible side effects of surgery Some complications, such as bleeding, infections, or reactions to  anesthesia (the drugs used to make you sleep during surgery), can occur during or after any type of surgery. These are rare, but they can happen.  Surgery for pituitary tumors is done in a very small space that is surrounded by important structures. Surgeons are extremely careful to limit any problems both during and after surgery. Still, very rarely, pituitary surgery might result in damage to the large arteries, brain tissue, or nerves near the pituitary. This could result  in complications such as brain damage, a stroke, or long-term vision problems.  Most people who have transsphenoidal surgery will have a sinus headache and congestion for up to a week or 2 after surgery.  When surgeons use the transsphenoidal approach to reach the pituitary gland, they create a temporary pathway between the nasal sinuses and airways and the brain. Until this heals, a person can get meningitis, which is inflammation of the meninges (the thin protective layers covering the brain). Damage to the meninges can also lead to leakage of cerebrospinal fluid (CSF, the fluid that bathes and cushions the brain) out of the nose. The chance of this happening depends to some extent on the size and type of tumor.  Diabetes insipidus (see Signs and Symptoms of Pituitary Tumors), which happens when not enough vasopressin is released by the posterior pituitary, may occur right after surgery, but it usually improves on its own within a few weeks after surgery.  Damage to other parts of the pituitary can lead to symptoms from a lack of pituitary hormones. This is rare after surgery for small tumors, but it may be unavoidable when treating some larger tumors. Low hormone levels after surgery can be treated with medicines to replace certain hormones normally made by the pituitary and other glands.  You will be watched closely after surgery, and your blood levels of hormones and other important substances will be checked often.  Some side effects might need to be treated. For example:  If diabetes insipidus doesn't get better on its own, it may need to be treated with a desmopressin nasal spray. If vitamin and/or mineral levels change, you may need supplements for a while. For instance, potassium levels often drop, so you may need to get it intravenously (IV, or in a vein) right after surgery. Talk to your doctor about what you should watch for and what you should do if you have any problems.  More  information about surgery For more general information about surgery as a treatment for cancer, see Cancer Surgery.  Written by: [employer]

## 2024-06-19 ENCOUNTER — Ambulatory Visit: Payer: Self-pay | Admitting: Family Medicine

## 2024-06-19 ENCOUNTER — Ambulatory Visit: Payer: Self-pay | Admitting: Internal Medicine

## 2024-06-19 ENCOUNTER — Ambulatory Visit (HOSPITAL_COMMUNITY)

## 2024-06-19 LAB — PROLACTIN: Prolactin: 17.5 ng/mL

## 2024-06-22 DIAGNOSIS — D352 Benign neoplasm of pituitary gland: Secondary | ICD-10-CM | POA: Diagnosis not present

## 2024-06-22 NOTE — Telephone Encounter (Signed)
 Kelci, please get her on my DOD/ any acute to do preop clearance, EKG, etc.

## 2024-06-23 ENCOUNTER — Ambulatory Visit: Payer: Self-pay | Admitting: Internal Medicine

## 2024-06-23 LAB — ACTH: C206 ACTH: 15 pg/mL (ref 6–50)

## 2024-06-23 LAB — CORTISOL: Cortisol, Plasma: 10.7 ug/dL

## 2024-06-23 LAB — INSULIN-LIKE GROWTH FACTOR
IGF-I, LC/MS: 99 ng/mL (ref 41–279)
Z-Score (Female): -0.4 {STDV} (ref ?–2.0)

## 2024-06-23 LAB — FOLLICLE STIMULATING HORMONE: FSH: 54.7 m[IU]/mL

## 2024-06-23 LAB — LUTEINIZING HORMONE: LH: 13.3 m[IU]/mL

## 2024-07-06 DIAGNOSIS — D497 Neoplasm of unspecified behavior of endocrine glands and other parts of nervous system: Secondary | ICD-10-CM | POA: Diagnosis not present

## 2024-07-06 DIAGNOSIS — D352 Benign neoplasm of pituitary gland: Secondary | ICD-10-CM | POA: Diagnosis not present

## 2024-07-06 DIAGNOSIS — E785 Hyperlipidemia, unspecified: Secondary | ICD-10-CM | POA: Diagnosis not present

## 2024-07-06 DIAGNOSIS — Z9989 Dependence on other enabling machines and devices: Secondary | ICD-10-CM | POA: Diagnosis not present

## 2024-07-06 DIAGNOSIS — E119 Type 2 diabetes mellitus without complications: Secondary | ICD-10-CM | POA: Diagnosis not present

## 2024-07-06 DIAGNOSIS — G4733 Obstructive sleep apnea (adult) (pediatric): Secondary | ICD-10-CM | POA: Diagnosis not present

## 2024-07-06 DIAGNOSIS — I10 Essential (primary) hypertension: Secondary | ICD-10-CM | POA: Diagnosis not present

## 2024-07-06 DIAGNOSIS — Z8249 Family history of ischemic heart disease and other diseases of the circulatory system: Secondary | ICD-10-CM | POA: Diagnosis not present

## 2024-07-06 DIAGNOSIS — Z7982 Long term (current) use of aspirin: Secondary | ICD-10-CM | POA: Diagnosis not present

## 2024-07-06 DIAGNOSIS — E0500 Thyrotoxicosis with diffuse goiter without thyrotoxic crisis or storm: Secondary | ICD-10-CM | POA: Diagnosis not present

## 2024-07-06 DIAGNOSIS — Z79899 Other long term (current) drug therapy: Secondary | ICD-10-CM | POA: Diagnosis not present

## 2024-07-13 ENCOUNTER — Other Ambulatory Visit (HOSPITAL_COMMUNITY)

## 2024-07-13 DIAGNOSIS — M170 Bilateral primary osteoarthritis of knee: Secondary | ICD-10-CM | POA: Diagnosis not present

## 2024-07-24 ENCOUNTER — Ambulatory Visit (HOSPITAL_COMMUNITY)
Admission: RE | Admit: 2024-07-24 | Discharge: 2024-07-24 | Disposition: A | Discharge status: Home / Self Care | Payer: Self-pay | Source: Ambulatory Visit | Attending: Family Medicine | Admitting: Family Medicine

## 2024-07-24 DIAGNOSIS — E1169 Type 2 diabetes mellitus with other specified complication: Secondary | ICD-10-CM | POA: Insufficient documentation

## 2024-07-24 DIAGNOSIS — K760 Fatty (change of) liver, not elsewhere classified: Secondary | ICD-10-CM | POA: Insufficient documentation

## 2024-07-24 DIAGNOSIS — E119 Type 2 diabetes mellitus without complications: Secondary | ICD-10-CM | POA: Insufficient documentation

## 2024-07-24 DIAGNOSIS — E785 Hyperlipidemia, unspecified: Secondary | ICD-10-CM | POA: Insufficient documentation

## 2024-07-24 DIAGNOSIS — Z7985 Long-term (current) use of injectable non-insulin antidiabetic drugs: Secondary | ICD-10-CM | POA: Insufficient documentation

## 2024-07-27 ENCOUNTER — Encounter: Payer: Self-pay | Admitting: Internal Medicine

## 2024-07-28 ENCOUNTER — Other Ambulatory Visit: Payer: Self-pay

## 2024-07-28 ENCOUNTER — Other Ambulatory Visit (HOSPITAL_COMMUNITY): Payer: Self-pay

## 2024-07-28 MED ORDER — CYCLOSPORINE 0.05 % OP EMUL
1.0000 [drp] | Freq: Two times a day (BID) | OPHTHALMIC | 4 refills | Status: AC
  Filled 2024-07-28: qty 180, 90d supply, fill #0
  Filled 2024-10-26: qty 180, 90d supply, fill #1

## 2024-08-07 NOTE — Progress Notes (Signed)
 Laura Valencia is a 63 y.o. female who is here for preoperative clearance for transsphenoidal pituitary adenoma resection with Dr. Maranda Ka scheduled 09/14/2024.  She denies history of surgical complications in the past, including difficulty rousing from anesthesia, post op nausea and vomiting or poor healing.  She has controlled DM with last A1c 6.7.  Denies chest pain, shortness of breath, falls.  Continues to have ptosis of her eyelid.  Past Surgical History:  Procedure Laterality Date   BREAST EXCISIONAL BIOPSY Left 10+ years ago   intraductal papilloma per pt   CARPAL TUNNEL RELEASE     BILATERAL   COLONOSCOPY  11/2010   [facility]   POLYPECTOMY     TRIGGER FINGER RELEASE Left    middle finger    Current Outpatient Medications:    aspirin 81 MG tablet, Take 81 mg by mouth daily., Disp: , Rfl:    Blood Glucose Monitoring Suppl (BLOOD GLUCOSE MONITOR SYSTEM) w/Device KIT, Use daily as directed, Disp: 1 kit, Rfl: 0   cephALEXin  (KEFLEX ) 250 MG capsule, Take 1 capsule (250 mg total) by mouth as needed after intercourse to prevent UTI, Disp: 30 capsule, Rfl: 5   clobetasol  ointment (TEMOVATE ) 0.05 %, Apply daily at bedtime to affected area., Disp: 60 g, Rfl: 1   cycloSPORINE  (RESTASIS ) 0.05 % ophthalmic emulsion, Place 1 drop into both eyes 2 (two) times daily., Disp: 180 each, Rfl: 4   cycloSPORINE  (RESTASIS ) 0.05 % ophthalmic emulsion, Place 1 drop into both eyes 2 (two) times daily., Disp: 180 each, Rfl: 4   Empagliflozin -metFORMIN  HCl ER (SYNJARDY  XR) 12.04-999 MG TB24, Take 1 tablet by mouth daily., Disp: 90 tablet, Rfl: 3   estradiol  (ESTRACE ) 0.1 MG/GM vaginal cream, Apply green pea sized amount to opening of vagina 3 times a week., Disp: 42.5 g, Rfl: 5   fluticasone  (FLONASE ) 50 MCG/ACT nasal spray, Place 1 spray into both nostrils daily., Disp: 16 g, Rfl: 11   Glucose Blood (BLOOD GLUCOSE TEST STRIPS) STRP, check blood sugar daily. DX: E11.9, Disp: 100 strip,  Rfl: 3   Lancets (FREESTYLE) lancets, Use to check blood sugar daily. DX: E11.9, Disp: 100 each, Rfl: 3   meloxicam  (MOBIC ) 15 MG tablet, Take 1 tablet (15 mg total) by mouth daily as needed for pain, Disp: 30 tablet, Rfl: 2   rosuvastatin  (CRESTOR ) 5 MG tablet, Take 1 tablet (5 mg total) by mouth daily., Disp: 90 tablet, Rfl: 3   tirzepatide  (MOUNJARO ) 15 MG/0.5ML Pen, Inject 15 mg into the skin once a week., Disp: 6 mL, Rfl: 4   Vitamin D , Ergocalciferol , (DRISDOL ) 1.25 MG (50000 UNIT) CAPS capsule, Take 1 capsule (50,000 Units total) by mouth every 7 (seven) days., Disp: 12 capsule, Rfl: 4  Past Medical History:  Diagnosis Date   Allergy    seasonal   Arthritis    knees   Colon polyp    TYPE UNKNOWN   Diabetes mellitus, type 2 (HCC)    Hyperthyroidism    HX; RX IN THE PAST   OSA (obstructive sleep apnea)    [facility] 06-19-2010 AHI 9.3/HR- wears cpap   Osteoarthritis    Seasonal rhinitis    MILD   Sleep apnea    occasional uses CPAP   No Known Allergies   1) High Risk Cardiac Conditions  1) Recent MI - No.  2) Decompensated Heart Failure - No.  3) Unstable angina - No.  4) Symptomatic arrythmia - No.  5) Sx Valvular Disease - No.  2) Intermediate Risk Factors - DM, CKD, CVA, CHF, CAD - Yes.    2) Functional Status - > 4 mets (Walk, run, climb stairs) Yes.  Laura Valencia Activity Status Index: 42.7  3) Surgery Specific Risk - High  (brain)  4) Further Noninvasive evaluation -   1) EKG - Yes.     1) Hx of CVA, CAD, DM, CKD  2) Echo - No.   1) Worsening dyspnea   3) Stress Testing - Active Cardiac Disease - No.  5) Need for medical therapy - Beta Blocker, Statins indicated ? No.  PE: Vitals:   08/10/24 1458  BP: 111/74  Pulse: 78  Temp: 97.9 F (36.6 C)  SpO2: 94%   Physical Examination: General appearance - alert, well appearing, and in no distress and overweight Mental status - alert, oriented to person, place, and time Eyes - pupils equal and reactive, extraocular  eye movements intact, ptosis of the right upper eye lid present Mouth - mucous membranes moist, pharynx normal without lesions and Mallampati 3 Neck - supple, no significant adenopathy Chest - clear to auscultation, no wheezes, rales or rhonchi, symmetric air entry Heart - normal rate, regular rhythm, normal S1, S2, no murmurs, rubs, clicks or gallops  Preop general physical exam - Plan: EKG 12-Lead, CBC, CoaguChek XS/INR Waived  Pituitary mass (HCC) - Plan: CMP14+EGFR  Diabetes mellitus treated with injections of non-insulin  medication (HCC) - Plan: EKG 12-Lead, CMP14+EGFR, CBC  Hyperlipidemia associated with type 2 diabetes mellitus (HCC) - Plan: EKG 12-Lead, CMP14+EGFR  Fatty liver - Plan: CMP14+EGFR, CBC, CoaguChek XS/INR Waived  I have independently evaluated patient.  Laura Valencia is a 63 y.o. female who is low risk for a high risk surgery.  There are modifiable risk factors (BMI). Laura Valencia RCRI calculation for MACE is: 0.    EKG reviewed and there is no evidence of ischemic processes.  Her blood pressure is well-controlled and her A1c was well-controlled in June at 6.7.  Will check renal function, liver enzymes and updated CBC with INR and fax these labs over to her surgeon with her clearance form   Arden Tinoco M. Jolinda, DO [facility]

## 2024-08-10 ENCOUNTER — Ambulatory Visit (INDEPENDENT_AMBULATORY_CARE_PROVIDER_SITE_OTHER): Admitting: Family Medicine

## 2024-08-10 ENCOUNTER — Encounter: Payer: Self-pay | Admitting: Family Medicine

## 2024-08-10 VITALS — BP 111/74 | HR 78 | Temp 97.9°F | Ht 66.0 in | Wt 223.0 lb

## 2024-08-10 DIAGNOSIS — E1169 Type 2 diabetes mellitus with other specified complication: Secondary | ICD-10-CM | POA: Diagnosis not present

## 2024-08-10 DIAGNOSIS — E236 Other disorders of pituitary gland: Secondary | ICD-10-CM

## 2024-08-10 DIAGNOSIS — E119 Type 2 diabetes mellitus without complications: Secondary | ICD-10-CM | POA: Diagnosis not present

## 2024-08-10 DIAGNOSIS — Z01818 Encounter for other preprocedural examination: Secondary | ICD-10-CM

## 2024-08-10 DIAGNOSIS — E785 Hyperlipidemia, unspecified: Secondary | ICD-10-CM | POA: Diagnosis not present

## 2024-08-10 DIAGNOSIS — Z7985 Long-term (current) use of injectable non-insulin antidiabetic drugs: Secondary | ICD-10-CM | POA: Diagnosis not present

## 2024-08-10 DIAGNOSIS — K760 Fatty (change of) liver, not elsewhere classified: Secondary | ICD-10-CM | POA: Diagnosis not present

## 2024-08-10 LAB — COAGUCHEK XS/INR WAIVED
INR: 0.9 (ref 0.9–1.1)
Prothrombin Time: 11.2 s

## 2024-08-11 ENCOUNTER — Ambulatory Visit: Payer: Self-pay | Admitting: Family Medicine

## 2024-08-11 LAB — CBC
Hematocrit: 40.5 % (ref 34.0–46.6)
Hemoglobin: 12.6 g/dL (ref 11.1–15.9)
MCH: 27.8 pg (ref 26.6–33.0)
MCHC: 31.1 g/dL — ABNORMAL LOW (ref 31.5–35.7)
MCV: 89 fL (ref 79–97)
Platelets: 239 x10E3/uL (ref 150–450)
RBC: 4.54 x10E6/uL (ref 3.77–5.28)
RDW: 13.2 % (ref 11.7–15.4)
WBC: 5.3 x10E3/uL (ref 3.4–10.8)

## 2024-08-11 LAB — CMP14+EGFR
ALT: 53 IU/L — ABNORMAL HIGH (ref 0–32)
AST: 32 IU/L (ref 0–40)
Albumin: 3.9 g/dL (ref 3.9–4.9)
Alkaline Phosphatase: 82 IU/L (ref 44–121)
BUN/Creatinine Ratio: 35 — ABNORMAL HIGH (ref 12–28)
BUN: 24 mg/dL (ref 8–27)
Bilirubin Total: 0.5 mg/dL (ref 0.0–1.2)
CO2: 21 mmol/L (ref 20–29)
Calcium: 9.5 mg/dL (ref 8.7–10.3)
Chloride: 106 mmol/L (ref 96–106)
Creatinine, Ser: 0.69 mg/dL (ref 0.57–1.00)
Globulin, Total: 2.5 g/dL (ref 1.5–4.5)
Glucose: 97 mg/dL (ref 70–99)
Potassium: 3.5 mmol/L (ref 3.5–5.2)
Sodium: 142 mmol/L (ref 134–144)
Total Protein: 6.4 g/dL (ref 6.0–8.5)
eGFR: 96 mL/min/1.73 (ref >59)

## 2024-08-13 ENCOUNTER — Other Ambulatory Visit (HOSPITAL_COMMUNITY): Payer: Self-pay

## 2024-08-18 ENCOUNTER — Ambulatory Visit: Admitting: Family Medicine

## 2024-08-19 DIAGNOSIS — J310 Chronic rhinitis: Secondary | ICD-10-CM | POA: Diagnosis not present

## 2024-08-19 DIAGNOSIS — D352 Benign neoplasm of pituitary gland: Secondary | ICD-10-CM | POA: Diagnosis not present

## 2024-08-19 DIAGNOSIS — R930 Abnormal findings on diagnostic imaging of skull and head, not elsewhere classified: Secondary | ICD-10-CM | POA: Diagnosis not present

## 2024-08-22 ENCOUNTER — Other Ambulatory Visit (HOSPITAL_COMMUNITY): Payer: Self-pay

## 2024-08-24 ENCOUNTER — Other Ambulatory Visit: Payer: Self-pay

## 2024-09-07 ENCOUNTER — Ambulatory Visit: Admitting: Internal Medicine

## 2024-09-08 ENCOUNTER — Other Ambulatory Visit (HOSPITAL_COMMUNITY): Payer: Self-pay

## 2024-09-08 ENCOUNTER — Other Ambulatory Visit (HOSPITAL_BASED_OUTPATIENT_CLINIC_OR_DEPARTMENT_OTHER): Payer: Self-pay

## 2024-09-14 DIAGNOSIS — Z79899 Other long term (current) drug therapy: Secondary | ICD-10-CM | POA: Diagnosis not present

## 2024-09-14 DIAGNOSIS — Z7982 Long term (current) use of aspirin: Secondary | ICD-10-CM | POA: Diagnosis not present

## 2024-09-14 DIAGNOSIS — E785 Hyperlipidemia, unspecified: Secondary | ICD-10-CM | POA: Diagnosis not present

## 2024-09-14 DIAGNOSIS — I10 Essential (primary) hypertension: Secondary | ICD-10-CM | POA: Diagnosis not present

## 2024-09-14 DIAGNOSIS — Z9989 Dependence on other enabling machines and devices: Secondary | ICD-10-CM | POA: Diagnosis not present

## 2024-09-14 DIAGNOSIS — E0500 Thyrotoxicosis with diffuse goiter without thyrotoxic crisis or storm: Secondary | ICD-10-CM | POA: Diagnosis not present

## 2024-09-14 DIAGNOSIS — Z794 Long term (current) use of insulin: Secondary | ICD-10-CM | POA: Diagnosis not present

## 2024-09-14 DIAGNOSIS — J329 Chronic sinusitis, unspecified: Secondary | ICD-10-CM | POA: Diagnosis not present

## 2024-09-14 DIAGNOSIS — Z91199 Patient's noncompliance with other medical treatment and regimen due to unspecified reason: Secondary | ICD-10-CM | POA: Diagnosis not present

## 2024-09-14 DIAGNOSIS — E119 Type 2 diabetes mellitus without complications: Secondary | ICD-10-CM | POA: Diagnosis not present

## 2024-09-14 DIAGNOSIS — G4733 Obstructive sleep apnea (adult) (pediatric): Secondary | ICD-10-CM | POA: Diagnosis not present

## 2024-09-14 DIAGNOSIS — D352 Benign neoplasm of pituitary gland: Secondary | ICD-10-CM | POA: Diagnosis not present

## 2024-09-21 DIAGNOSIS — D352 Benign neoplasm of pituitary gland: Secondary | ICD-10-CM | POA: Diagnosis not present

## 2024-09-24 HISTORY — PX: BRAIN SURGERY: SHX531

## 2024-09-30 ENCOUNTER — Other Ambulatory Visit (HOSPITAL_COMMUNITY): Payer: Self-pay

## 2024-09-30 DIAGNOSIS — J310 Chronic rhinitis: Secondary | ICD-10-CM | POA: Diagnosis not present

## 2024-09-30 DIAGNOSIS — D497 Neoplasm of unspecified behavior of endocrine glands and other parts of nervous system: Secondary | ICD-10-CM | POA: Diagnosis not present

## 2024-09-30 DIAGNOSIS — D352 Benign neoplasm of pituitary gland: Secondary | ICD-10-CM | POA: Diagnosis not present

## 2024-09-30 HISTORY — PX: OTHER SURGICAL HISTORY: SHX169

## 2024-10-09 ENCOUNTER — Other Ambulatory Visit: Payer: Self-pay

## 2024-10-12 ENCOUNTER — Ambulatory Visit: Admitting: Obstetrics and Gynecology

## 2024-10-16 ENCOUNTER — Other Ambulatory Visit (HOSPITAL_COMMUNITY): Payer: Self-pay

## 2024-10-20 DIAGNOSIS — H524 Presbyopia: Secondary | ICD-10-CM | POA: Diagnosis not present

## 2024-10-20 LAB — OPHTHALMOLOGY REPORT-SCANNED

## 2024-10-21 ENCOUNTER — Ambulatory Visit: Admitting: Obstetrics and Gynecology

## 2024-10-21 ENCOUNTER — Encounter: Payer: Self-pay | Admitting: Family Medicine

## 2024-10-21 DIAGNOSIS — D497 Neoplasm of unspecified behavior of endocrine glands and other parts of nervous system: Secondary | ICD-10-CM | POA: Diagnosis not present

## 2024-10-21 DIAGNOSIS — D352 Benign neoplasm of pituitary gland: Secondary | ICD-10-CM | POA: Diagnosis not present

## 2024-10-26 ENCOUNTER — Other Ambulatory Visit: Payer: Self-pay

## 2024-10-26 DIAGNOSIS — M25561 Pain in right knee: Secondary | ICD-10-CM | POA: Diagnosis not present

## 2024-10-26 DIAGNOSIS — G8929 Other chronic pain: Secondary | ICD-10-CM | POA: Diagnosis not present

## 2024-10-26 DIAGNOSIS — M25562 Pain in left knee: Secondary | ICD-10-CM | POA: Diagnosis not present

## 2024-10-26 DIAGNOSIS — M170 Bilateral primary osteoarthritis of knee: Secondary | ICD-10-CM | POA: Diagnosis not present

## 2024-10-28 DIAGNOSIS — D352 Benign neoplasm of pituitary gland: Secondary | ICD-10-CM | POA: Diagnosis not present

## 2024-10-28 DIAGNOSIS — J310 Chronic rhinitis: Secondary | ICD-10-CM | POA: Diagnosis not present

## 2024-11-02 DIAGNOSIS — D352 Benign neoplasm of pituitary gland: Secondary | ICD-10-CM | POA: Diagnosis not present

## 2024-11-02 DIAGNOSIS — H2513 Age-related nuclear cataract, bilateral: Secondary | ICD-10-CM | POA: Diagnosis not present

## 2024-11-12 ENCOUNTER — Other Ambulatory Visit: Payer: Self-pay

## 2024-11-12 ENCOUNTER — Other Ambulatory Visit (HOSPITAL_COMMUNITY): Payer: Self-pay

## 2024-11-27 ENCOUNTER — Encounter: Payer: Self-pay | Admitting: Internal Medicine

## 2024-11-27 ENCOUNTER — Other Ambulatory Visit (HOSPITAL_COMMUNITY): Payer: Self-pay

## 2024-11-30 ENCOUNTER — Ambulatory Visit: Admitting: Internal Medicine

## 2024-12-07 ENCOUNTER — Ambulatory Visit: Payer: Self-pay | Admitting: Family Medicine

## 2024-12-09 DIAGNOSIS — H25811 Combined forms of age-related cataract, right eye: Secondary | ICD-10-CM | POA: Diagnosis not present

## 2024-12-09 DIAGNOSIS — H2511 Age-related nuclear cataract, right eye: Secondary | ICD-10-CM | POA: Diagnosis not present

## 2024-12-09 DIAGNOSIS — H25011 Cortical age-related cataract, right eye: Secondary | ICD-10-CM | POA: Diagnosis not present

## 2024-12-14 DIAGNOSIS — D352 Benign neoplasm of pituitary gland: Secondary | ICD-10-CM | POA: Diagnosis not present

## 2024-12-15 DIAGNOSIS — J310 Chronic rhinitis: Secondary | ICD-10-CM | POA: Diagnosis not present

## 2024-12-22 ENCOUNTER — Other Ambulatory Visit: Payer: Self-pay | Admitting: Family Medicine

## 2024-12-22 ENCOUNTER — Other Ambulatory Visit (HOSPITAL_COMMUNITY): Payer: Self-pay

## 2024-12-22 ENCOUNTER — Other Ambulatory Visit: Payer: Self-pay

## 2024-12-22 MED ORDER — CYCLOSPORINE 0.05 % OP EMUL
1.0000 [drp] | Freq: Two times a day (BID) | OPHTHALMIC | 4 refills | Status: DC
  Filled 2024-12-22: qty 180, 90d supply, fill #0

## 2024-12-22 MED ORDER — MELOXICAM 15 MG PO TABS
15.0000 mg | ORAL_TABLET | Freq: Every day | ORAL | 0 refills | Status: AC | PRN
  Filled 2024-12-22: qty 30, 30d supply, fill #0

## 2024-12-23 ENCOUNTER — Other Ambulatory Visit (HOSPITAL_COMMUNITY): Payer: Self-pay

## 2024-12-30 ENCOUNTER — Encounter: Payer: Self-pay | Admitting: Family Medicine

## 2024-12-30 ENCOUNTER — Other Ambulatory Visit: Payer: Self-pay | Admitting: Family Medicine

## 2024-12-30 DIAGNOSIS — E1169 Type 2 diabetes mellitus with other specified complication: Secondary | ICD-10-CM

## 2024-12-30 DIAGNOSIS — E119 Type 2 diabetes mellitus without complications: Secondary | ICD-10-CM

## 2024-12-30 DIAGNOSIS — Z01818 Encounter for other preprocedural examination: Secondary | ICD-10-CM

## 2025-01-11 ENCOUNTER — Encounter: Payer: Self-pay | Admitting: *Deleted

## 2025-01-14 ENCOUNTER — Other Ambulatory Visit: Payer: Self-pay

## 2025-01-15 ENCOUNTER — Ambulatory Visit: Payer: Self-pay | Admitting: Family Medicine

## 2025-01-15 LAB — CMP14+EGFR
ALT: 32 IU/L (ref 0–32)
AST: 26 IU/L (ref 0–40)
Albumin: 4.4 g/dL (ref 3.9–4.9)
Alkaline Phosphatase: 95 IU/L (ref 49–135)
BUN/Creatinine Ratio: 28 (ref 12–28)
BUN: 19 mg/dL (ref 8–27)
Bilirubin Total: 0.8 mg/dL (ref 0.0–1.2)
CO2: 22 mmol/L (ref 20–29)
Calcium: 10 mg/dL (ref 8.7–10.3)
Chloride: 106 mmol/L (ref 96–106)
Creatinine, Ser: 0.69 mg/dL (ref 0.57–1.00)
Globulin, Total: 2.7 g/dL (ref 1.5–4.5)
Glucose: 106 mg/dL — AB (ref 70–99)
Potassium: 4.7 mmol/L (ref 3.5–5.2)
Sodium: 143 mmol/L (ref 134–144)
Total Protein: 7.1 g/dL (ref 6.0–8.5)
eGFR: 96 mL/min/1.73

## 2025-01-15 LAB — CBC WITH DIFFERENTIAL/PLATELET
Basophils Absolute: 0 x10E3/uL (ref 0.0–0.2)
Basos: 0 %
EOS (ABSOLUTE): 0 x10E3/uL (ref 0.0–0.4)
Eos: 0 %
Hematocrit: 44.6 % (ref 34.0–46.6)
Hemoglobin: 13.5 g/dL (ref 11.1–15.9)
Immature Grans (Abs): 0 x10E3/uL (ref 0.0–0.1)
Immature Granulocytes: 0 %
Lymphocytes Absolute: 1.7 x10E3/uL (ref 0.7–3.1)
Lymphs: 33 %
MCH: 27.1 pg (ref 26.6–33.0)
MCHC: 30.3 g/dL — ABNORMAL LOW (ref 31.5–35.7)
MCV: 89 fL (ref 79–97)
Monocytes Absolute: 0.7 x10E3/uL (ref 0.1–0.9)
Monocytes: 13 %
Neutrophils Absolute: 2.7 x10E3/uL (ref 1.4–7.0)
Neutrophils: 54 %
Platelets: 271 x10E3/uL (ref 150–450)
RBC: 4.99 x10E6/uL (ref 3.77–5.28)
RDW: 13.3 % (ref 11.7–15.4)
WBC: 5.2 x10E3/uL (ref 3.4–10.8)

## 2025-01-15 LAB — HEMOGLOBIN A1C
Est. average glucose Bld gHb Est-mCnc: 157 mg/dL
Hgb A1c MFr Bld: 7.1 % — ABNORMAL HIGH (ref 4.8–5.6)

## 2025-01-18 ENCOUNTER — Encounter: Payer: Self-pay | Admitting: Family Medicine

## 2025-01-18 ENCOUNTER — Ambulatory Visit: Admitting: Family Medicine

## 2025-01-18 VITALS — BP 126/84 | HR 79 | Temp 97.7°F | Ht 66.0 in | Wt 220.0 lb

## 2025-01-18 DIAGNOSIS — Z7985 Long-term (current) use of injectable non-insulin antidiabetic drugs: Secondary | ICD-10-CM

## 2025-01-18 DIAGNOSIS — E119 Type 2 diabetes mellitus without complications: Secondary | ICD-10-CM | POA: Diagnosis not present

## 2025-01-18 DIAGNOSIS — E559 Vitamin D deficiency, unspecified: Secondary | ICD-10-CM | POA: Diagnosis not present

## 2025-01-18 DIAGNOSIS — Z87898 Personal history of other specified conditions: Secondary | ICD-10-CM

## 2025-01-18 DIAGNOSIS — E1169 Type 2 diabetes mellitus with other specified complication: Secondary | ICD-10-CM | POA: Diagnosis not present

## 2025-01-18 DIAGNOSIS — M170 Bilateral primary osteoarthritis of knee: Secondary | ICD-10-CM

## 2025-01-18 DIAGNOSIS — E785 Hyperlipidemia, unspecified: Secondary | ICD-10-CM | POA: Diagnosis not present

## 2025-01-18 NOTE — Progress Notes (Signed)
 "  Subjective: CC:DM PCP: Jolinda Norene HERO, DO YEP:[id number] Laura Valencia is a 64 y.o. female presenting to clinic today for:  Type 2 Diabetes with hyperlipidemia:  She is compliant with her medications.  She is now working out about 3 days/week and has been really trying to clean up diet after the holidays.  She is hoping that her A1c will be at goal by the time we checked her levels next.  She has an appointment with the urogynecologist soon.  She has recovered well from her pituitary surgery but does she feel like she moves a little slower than her baseline.  Her neurosurgeon apparently is leaving to go back to [place]  and her care will be taken over by another provider.  She has some residual pituitary so they will be monitoring this via imaging but no recurrent surgery was recommended and he also recommended against radiation treatment as the side effects of the medication were felt to outweigh the benefit.  She reports that she still feels like she has slight ptosis of the eyelid.  She has had cataract surgery in the right eye since our last visit and plans undergo left eye cataract surgery in March.  Furthermore, she reports to me that she has held off on surgery of her knee to the end of the year.  She is doing okay status post her father's passing.   Diabetes Health Maintenance Due  Topic Date Due   FOOT EXAM  02/16/2025   HEMOGLOBIN A1C  07/14/2025   OPHTHALMOLOGY EXAM  10/20/2025    ROS: Per HPI  Allergies[1] Past Medical History:  Diagnosis Date   Allergy    seasonal   Arthritis    knees   Colon polyp    TYPE UNKNOWN   Diabetes mellitus, type 2 (HCC)    Hyperthyroidism    HX; RX IN THE PAST   OSA (obstructive sleep apnea)    [facility] 06-19-2010 AHI 9.3/HR- wears cpap   Osteoarthritis    Seasonal rhinitis    MILD   Sleep apnea    occasional uses CPAP   Current Medications[2] Social History   Socioeconomic History   Marital status: Married    Spouse name:  Not on file   Number of children: 2   Years of education: Not on file   Highest education level: Professional school degree (e.g., MD, DDS, DVM, JD)  Occupational History   Occupation: MD    Employer: [employer]    Comment: FAMILY MEDICINE  Tobacco Use   Smoking status: Never   Smokeless tobacco: Never  Vaping Use   Vaping status: Never Used  Substance and Sexual Activity   Alcohol use: Yes    Comment: RARE USE OF ETOH   Drug use: No   Sexual activity: Yes    Birth control/protection: Post-menopausal  Other Topics Concern   Not on file  Social History Narrative   [place]IGINALLY FROM [place].   Social Drivers of Health   Tobacco Use: Low Risk (12/15/2024)   Received from [facility]   Patient History    Smoking Tobacco Use: Never    Smokeless Tobacco Use: Never    Passive Exposure: Never  Financial Resource Strain: Low Risk (01/17/2025)   Overall Financial Resource Strain (CARDIA)    Difficulty of Paying Living Expenses: Not hard at all  Food Insecurity: No Food Insecurity (01/17/2025)   Epic    Worried About Radiation Protection Practitioner of Food in the Last Year: Never true  Ran Out of Food in the Last Year: Never true  Transportation Needs: No Transportation Needs (01/17/2025)   Epic    Lack of Transportation (Medical): No    Lack of Transportation (Non-Medical): No  Physical Activity: Sufficiently Active (01/17/2025)   Exercise Vital Sign    Days of Exercise per Week: 3 days    Minutes of Exercise per Session: 60 min  Stress: No Stress Concern Present (01/17/2025)   [employer] of Occupational Health - Occupational Stress Questionnaire    Feeling of Stress: Not at all  Social Connections: Socially Integrated (01/17/2025)   Social Connection and Isolation Panel    Frequency of Communication with Friends and Family: More than three times a week    Frequency of Social Gatherings with Friends and Family: Once a week    Attends Religious Services: More than 4 times  per year    Active Member of [employer] or Organizations: Yes    Attends Banker Meetings: More than 4 times per year    Marital Status: Married  Catering Manager Violence: Not At Risk (08/19/2023)   Received from [facility]   Epic    Within the last year, have you been afraid of your partner or ex-partner?: No    Within the last year, have you been humiliated or emotionally abused in other ways by your partner or ex-partner?: No    Within the last year, have you been kicked, hit, slapped, or otherwise physically hurt by your partner or ex-partner?: No    Within the last year, have you been raped or forced to have any kind of sexual activity by your partner or ex-partner?: No  Depression (PHQ2-9): Low Risk (08/10/2024)   Depression (PHQ2-9)    PHQ-2 Score: 0  Alcohol Screen: Not on file  Housing: Low Risk (01/17/2025)   Epic    Unable to Pay for Housing in the Last Year: No    Number of Times Moved in the Last Year: 1    Homeless in the Last Year: No  Utilities: Not on file  Health Literacy: Low Risk (08/19/2024)   Received from [facility]   Health Literacy    : Never   Family History  Problem Relation Age of Onset   Colon cancer Mother 65   Esophageal cancer Neg Hx    Stomach cancer Neg Hx    Rectal cancer Neg Hx    Colon polyps Neg Hx     Objective: Office vital signs reviewed. BP 126/84   Pulse 79   Temp 97.7 F (36.5 C)   Ht 5' 6 (1.676 m)   Wt 220 lb (99.8 kg)   LMP 03/19/2012   SpO2 97%   BMI 35.51 kg/m   Physical Examination:  General: Awake, alert, well-nourished, No acute distress HEENT: Sclera white.  Moist mucous membranes.  Previously seen ptosis appears to be improved Cardio: regular rate and rhythm, S1S2 heard, no murmurs appreciated Pulm: clear to auscultation bilaterally, no wheezes, rhonchi or rales; normal work of breathing on room air MSK: Ambulating independently with slightly antalgic gait   Lab Results  Component Value Date    HGBA1C 7.1 (H) 01/14/2025    Assessment/ Plan: 64 y.o. female   Diabetes mellitus treated with injections of non-insulin  medication (HCC) - Plan: Microalbumin / creatinine urine ratio, Hemoglobin A1c, CBC with Differential  Hyperlipidemia associated with type 2 diabetes mellitus (HCC) - Plan: Lipid Panel, CMP14+EGFR  Vitamin D  deficiency - Plan: VITAMIN D  25  Hydroxy (Vit-D Deficiency, Fractures)  History of pituitary tumor  Primary osteoarthritis of both knees  A1c borderline.  She is actively working on lifestyle modification to reduce A1c so I have placed a standing A1c for her to have repeated in 3 months and we will see her back in the office in 6 months for fasting labs for full physical exam.  Urine microalbumin collected today.  Continue current medications as prescribed  Vitamin D  ordered for history of deficiency to be collected along with her fasting labs in 6 months  Continue surveillance with neurosurgery for history of pituitary to do status post resection.  Luckily, no endocrine issues  Plan for knee replacement sometime in December   Caiden Arteaga M [place], DO [facility] [phone]     [1] No Known Allergies [2]  Current Outpatient Medications:    aspirin 81 MG tablet, Take 81 mg by mouth daily., Disp: , Rfl:    Blood Glucose Monitoring Suppl (BLOOD GLUCOSE MONITOR SYSTEM) w/Device KIT, Use daily as directed, Disp: 1 kit, Rfl: 0   cephALEXin  (KEFLEX ) 250 MG capsule, Take 1 capsule (250 mg total) by mouth as needed after intercourse to prevent UTI, Disp: 30 capsule, Rfl: 5   clobetasol  ointment (TEMOVATE ) 0.05 %, Apply daily at bedtime to affected area., Disp: 60 g, Rfl: 1   cycloSPORINE  (RESTASIS ) 0.05 % ophthalmic emulsion, Place 1 drop into both eyes 2 (two) times daily., Disp: 180 each, Rfl: 4   cycloSPORINE  (RESTASIS ) 0.05 % ophthalmic emulsion, Place 1 drop into both eyes 2 (two) times daily., Disp: 180 each, Rfl: 4    cycloSPORINE  (RESTASIS ) 0.05 % ophthalmic emulsion, Place 1 drop into both eyes 2 (two) times daily., Disp: 180 each, Rfl: 4   Empagliflozin -metFORMIN  HCl ER (SYNJARDY  XR) 12.04-999 MG TB24, Take 1 tablet by mouth daily., Disp: 90 tablet, Rfl: 3   estradiol  (ESTRACE ) 0.1 MG/GM vaginal cream, Apply green pea sized amount to opening of vagina 3 times a week., Disp: 42.5 g, Rfl: 5   fluticasone  (FLONASE ) 50 MCG/ACT nasal spray, Place 1 spray into both nostrils daily., Disp: 16 g, Rfl: 11   Glucose Blood (BLOOD GLUCOSE TEST STRIPS) STRP, check blood sugar daily. DX: E11.9, Disp: 100 strip, Rfl: 3   Lancets (FREESTYLE) lancets, Use to check blood sugar daily. DX: E11.9, Disp: 100 each, Rfl: 3   meloxicam  (MOBIC ) 15 MG tablet, Take 1 tablet (15 mg total) by mouth daily as needed for pain, Disp: 30 tablet, Rfl: 0   rosuvastatin  (CRESTOR ) 5 MG tablet, Take 1 tablet (5 mg total) by mouth daily., Disp: 90 tablet, Rfl: 3   tirzepatide  (MOUNJARO ) 15 MG/0.5ML Pen, Inject 15 mg into the skin once a week., Disp: 6 mL, Rfl: 4   Vitamin D , Ergocalciferol , (DRISDOL ) 1.25 MG (50000 UNIT) CAPS capsule, Take 1 capsule (50,000 Units total) by mouth every 7 (seven) days., Disp: 12 capsule, Rfl: 4  "

## 2025-01-18 NOTE — Patient Instructions (Signed)
 A1c in 3 months Future order for fasting labs placed for CPE in 35m.

## 2025-01-19 LAB — MICROALBUMIN / CREATININE URINE RATIO
Creatinine, Urine: 55.8 mg/dL
Microalb/Creat Ratio: 10 mg/g{creat} (ref 0–29)
Microalbumin, Urine: 5.8 ug/mL

## 2025-01-25 ENCOUNTER — Ambulatory Visit: Admitting: Obstetrics and Gynecology

## 2025-03-08 ENCOUNTER — Encounter

## 2025-03-08 DIAGNOSIS — Z1231 Encounter for screening mammogram for malignant neoplasm of breast: Secondary | ICD-10-CM

## 2025-03-22 ENCOUNTER — Ambulatory Visit: Admitting: Obstetrics and Gynecology

## 2025-07-28 ENCOUNTER — Ambulatory Visit
